# Patient Record
Sex: Female | Born: 1935
Health system: Southern US, Community
[De-identification: ages and names within clinical notes are randomized; demographics above are authoritative.]

## PROBLEM LIST (undated history)

## (undated) DIAGNOSIS — C569 Malignant neoplasm of unspecified ovary: Secondary | ICD-10-CM

## (undated) DIAGNOSIS — F0390 Unspecified dementia without behavioral disturbance: Secondary | ICD-10-CM

## (undated) DIAGNOSIS — D649 Anemia, unspecified: Secondary | ICD-10-CM

## (undated) DIAGNOSIS — J302 Other seasonal allergic rhinitis: Secondary | ICD-10-CM

## (undated) DIAGNOSIS — E039 Hypothyroidism, unspecified: Secondary | ICD-10-CM

## (undated) DIAGNOSIS — M810 Age-related osteoporosis without current pathological fracture: Secondary | ICD-10-CM

## (undated) DIAGNOSIS — K449 Diaphragmatic hernia without obstruction or gangrene: Secondary | ICD-10-CM

## (undated) DIAGNOSIS — I10 Essential (primary) hypertension: Secondary | ICD-10-CM

## (undated) DIAGNOSIS — E785 Hyperlipidemia, unspecified: Secondary | ICD-10-CM

## (undated) DIAGNOSIS — E669 Obesity, unspecified: Secondary | ICD-10-CM

## (undated) DIAGNOSIS — H3530 Unspecified macular degeneration: Secondary | ICD-10-CM

## (undated) DIAGNOSIS — K219 Gastro-esophageal reflux disease without esophagitis: Secondary | ICD-10-CM

## (undated) HISTORY — DX: Essential (primary) hypertension: I10

## (undated) HISTORY — PX: OTHER SURGICAL HISTORY: SHX169

## (undated) HISTORY — PX: ELBOW ARTHROPLASTY: SHX928

## (undated) HISTORY — DX: Malignant neoplasm of unspecified ovary: C56.9

## (undated) HISTORY — DX: Unspecified macular degeneration: H35.30

## (undated) HISTORY — DX: Diaphragmatic hernia without obstruction or gangrene: K44.9

## (undated) HISTORY — DX: Hypothyroidism, unspecified: E03.9

## (undated) HISTORY — DX: Age-related osteoporosis without current pathological fracture: M81.0

## (undated) HISTORY — DX: Hyperlipidemia, unspecified: E78.5

## (undated) HISTORY — PX: CHOLECYSTECTOMY, LAPAROSCOPIC: SHX56

## (undated) HISTORY — DX: Obesity, unspecified: E66.9

---

## 1998-03-12 ENCOUNTER — Other Ambulatory Visit: Admission: RE | Admit: 1998-03-12 | Discharge: 1998-03-12 | Payer: Self-pay | Admitting: Internal Medicine

## 1998-08-29 ENCOUNTER — Ambulatory Visit (HOSPITAL_COMMUNITY): Admission: RE | Admit: 1998-08-29 | Discharge: 1998-08-29 | Payer: Self-pay | Admitting: Internal Medicine

## 1998-09-19 ENCOUNTER — Inpatient Hospital Stay (HOSPITAL_COMMUNITY): Admission: EM | Admit: 1998-09-19 | Discharge: 1998-09-30 | Payer: Self-pay | Admitting: Emergency Medicine

## 1999-04-08 ENCOUNTER — Ambulatory Visit (HOSPITAL_COMMUNITY): Admission: RE | Admit: 1999-04-08 | Discharge: 1999-04-08 | Payer: Self-pay | Admitting: *Deleted

## 1999-04-17 ENCOUNTER — Ambulatory Visit (HOSPITAL_COMMUNITY): Admission: RE | Admit: 1999-04-17 | Discharge: 1999-04-17 | Payer: Self-pay | Admitting: *Deleted

## 1999-04-28 ENCOUNTER — Encounter (HOSPITAL_COMMUNITY): Admission: RE | Admit: 1999-04-28 | Discharge: 1999-07-21 | Payer: Self-pay | Admitting: Internal Medicine

## 1999-09-03 ENCOUNTER — Other Ambulatory Visit: Admission: RE | Admit: 1999-09-03 | Discharge: 1999-09-03 | Payer: Self-pay | Admitting: Internal Medicine

## 2001-05-05 ENCOUNTER — Other Ambulatory Visit: Admission: RE | Admit: 2001-05-05 | Discharge: 2001-05-05 | Payer: Self-pay | Admitting: Internal Medicine

## 2002-05-24 ENCOUNTER — Encounter (HOSPITAL_COMMUNITY): Admission: RE | Admit: 2002-05-24 | Discharge: 2002-08-22 | Payer: Self-pay | Admitting: Internal Medicine

## 2002-06-28 ENCOUNTER — Encounter: Payer: Self-pay | Admitting: Gastroenterology

## 2002-06-28 ENCOUNTER — Encounter: Admission: RE | Admit: 2002-06-28 | Discharge: 2002-06-28 | Payer: Self-pay | Admitting: Gastroenterology

## 2002-08-15 ENCOUNTER — Encounter: Payer: Self-pay | Admitting: Surgery

## 2002-08-22 ENCOUNTER — Encounter: Payer: Self-pay | Admitting: Surgery

## 2002-08-22 ENCOUNTER — Inpatient Hospital Stay (HOSPITAL_COMMUNITY): Admission: RE | Admit: 2002-08-22 | Discharge: 2002-08-25 | Payer: Self-pay | Admitting: Surgery

## 2002-08-22 ENCOUNTER — Encounter (INDEPENDENT_AMBULATORY_CARE_PROVIDER_SITE_OTHER): Payer: Self-pay

## 2002-08-23 ENCOUNTER — Encounter: Payer: Self-pay | Admitting: Surgery

## 2003-12-22 HISTORY — PX: OTHER SURGICAL HISTORY: SHX169

## 2004-02-10 ENCOUNTER — Emergency Department (HOSPITAL_COMMUNITY): Admission: EM | Admit: 2004-02-10 | Discharge: 2004-02-11 | Payer: Self-pay

## 2004-02-21 ENCOUNTER — Other Ambulatory Visit: Admission: RE | Admit: 2004-02-21 | Discharge: 2004-02-21 | Payer: Self-pay | Admitting: *Deleted

## 2004-03-12 ENCOUNTER — Ambulatory Visit: Admission: RE | Admit: 2004-03-12 | Discharge: 2004-03-12 | Payer: Self-pay | Admitting: Gynecology

## 2004-04-01 ENCOUNTER — Inpatient Hospital Stay (HOSPITAL_COMMUNITY): Admission: RE | Admit: 2004-04-01 | Discharge: 2004-04-09 | Payer: Self-pay | Admitting: Obstetrics & Gynecology

## 2004-04-01 ENCOUNTER — Encounter (INDEPENDENT_AMBULATORY_CARE_PROVIDER_SITE_OTHER): Payer: Self-pay | Admitting: *Deleted

## 2004-04-15 ENCOUNTER — Ambulatory Visit: Admission: RE | Admit: 2004-04-15 | Discharge: 2004-04-15 | Payer: Self-pay | Admitting: Gynecology

## 2004-05-20 ENCOUNTER — Ambulatory Visit: Admission: RE | Admit: 2004-05-20 | Discharge: 2004-05-20 | Payer: Self-pay | Admitting: Gynecology

## 2004-07-03 ENCOUNTER — Encounter: Admission: RE | Admit: 2004-07-03 | Discharge: 2004-07-03 | Payer: Self-pay | Admitting: Otolaryngology

## 2004-09-09 ENCOUNTER — Encounter (INDEPENDENT_AMBULATORY_CARE_PROVIDER_SITE_OTHER): Payer: Self-pay | Admitting: Specialist

## 2004-09-09 ENCOUNTER — Ambulatory Visit: Admission: RE | Admit: 2004-09-09 | Discharge: 2004-09-09 | Payer: Self-pay | Admitting: Gynecology

## 2004-09-09 ENCOUNTER — Other Ambulatory Visit: Admission: RE | Admit: 2004-09-09 | Discharge: 2004-09-09 | Payer: Self-pay | Admitting: Gynecology

## 2004-12-09 ENCOUNTER — Ambulatory Visit: Admission: RE | Admit: 2004-12-09 | Discharge: 2004-12-09 | Payer: Self-pay | Admitting: Gynecology

## 2005-03-27 ENCOUNTER — Ambulatory Visit: Admission: RE | Admit: 2005-03-27 | Discharge: 2005-03-27 | Payer: Self-pay | Admitting: Gynecology

## 2005-10-02 ENCOUNTER — Ambulatory Visit: Admission: RE | Admit: 2005-10-02 | Discharge: 2005-10-02 | Payer: Self-pay | Admitting: Gynecology

## 2005-10-23 ENCOUNTER — Encounter: Admission: RE | Admit: 2005-10-23 | Discharge: 2005-10-23 | Payer: Self-pay | Admitting: Orthopedic Surgery

## 2006-04-07 ENCOUNTER — Ambulatory Visit: Admission: RE | Admit: 2006-04-07 | Discharge: 2006-04-07 | Payer: Self-pay | Admitting: Gynecology

## 2006-04-21 ENCOUNTER — Encounter: Payer: Self-pay | Admitting: Emergency Medicine

## 2006-04-21 ENCOUNTER — Encounter: Payer: Self-pay | Admitting: Orthopedic Surgery

## 2006-10-15 ENCOUNTER — Ambulatory Visit: Admission: RE | Admit: 2006-10-15 | Discharge: 2006-10-15 | Payer: Self-pay | Admitting: Gynecology

## 2007-04-05 ENCOUNTER — Ambulatory Visit: Admission: RE | Admit: 2007-04-05 | Discharge: 2007-04-05 | Payer: Self-pay | Admitting: Gynecology

## 2007-08-17 ENCOUNTER — Encounter: Admission: RE | Admit: 2007-08-17 | Discharge: 2007-11-15 | Payer: Self-pay | Admitting: Internal Medicine

## 2007-09-30 ENCOUNTER — Ambulatory Visit: Admission: RE | Admit: 2007-09-30 | Discharge: 2007-09-30 | Payer: Self-pay | Admitting: Gynecology

## 2008-03-23 ENCOUNTER — Ambulatory Visit: Admission: RE | Admit: 2008-03-23 | Discharge: 2008-03-23 | Payer: Self-pay | Admitting: Gynecology

## 2008-09-21 ENCOUNTER — Encounter: Admission: RE | Admit: 2008-09-21 | Discharge: 2008-09-21 | Payer: Self-pay | Admitting: Internal Medicine

## 2008-09-26 ENCOUNTER — Ambulatory Visit: Admission: RE | Admit: 2008-09-26 | Discharge: 2008-09-26 | Payer: Self-pay | Admitting: Gynecology

## 2009-05-17 ENCOUNTER — Ambulatory Visit: Admission: RE | Admit: 2009-05-17 | Discharge: 2009-05-17 | Payer: Self-pay | Admitting: Gynecology

## 2009-11-18 ENCOUNTER — Encounter: Admission: RE | Admit: 2009-11-18 | Discharge: 2009-11-18 | Payer: Self-pay | Admitting: Orthopedic Surgery

## 2010-01-09 ENCOUNTER — Inpatient Hospital Stay (HOSPITAL_COMMUNITY): Admission: RE | Admit: 2010-01-09 | Discharge: 2010-01-11 | Payer: Self-pay | Admitting: Orthopedic Surgery

## 2010-01-09 ENCOUNTER — Encounter (INDEPENDENT_AMBULATORY_CARE_PROVIDER_SITE_OTHER): Payer: Self-pay | Admitting: Orthopedic Surgery

## 2011-03-08 LAB — CBC
HCT: 38.7 % (ref 36.0–46.0)
Hemoglobin: 10.7 g/dL — ABNORMAL LOW (ref 12.0–15.0)
Hemoglobin: 12.9 g/dL (ref 12.0–15.0)
MCHC: 34.3 g/dL (ref 30.0–36.0)
MCV: 91.7 fL (ref 78.0–100.0)
MCV: 92.5 fL (ref 78.0–100.0)
RBC: 4.18 MIL/uL (ref 3.87–5.11)
RDW: 13.9 % (ref 11.5–15.5)
RDW: 14.2 % (ref 11.5–15.5)

## 2011-03-08 LAB — COMPREHENSIVE METABOLIC PANEL
ALT: 17 U/L (ref 0–35)
Albumin: 3.9 g/dL (ref 3.5–5.2)
BUN: 21 mg/dL (ref 6–23)
Calcium: 9.8 mg/dL (ref 8.4–10.5)
GFR calc Af Amer: >60 mL/min (ref >60)
GFR calc non Af Amer: >60 mL/min (ref >60)
Sodium: 142 mEq/L (ref 135–145)
Total Protein: 6.8 g/dL (ref 6.0–8.3)

## 2011-03-08 LAB — TISSUE CULTURE
Culture: NO GROWTH
Culture: NO GROWTH

## 2011-03-08 LAB — DIFFERENTIAL
Basophils Relative: 1 % (ref 0–1)
Lymphocytes Relative: 19 % (ref 12–46)
Lymphs Abs: 1.6 10*3/uL (ref 0.7–4.0)
Monocytes Absolute: 0.9 10*3/uL (ref 0.1–1.0)

## 2011-03-08 LAB — BASIC METABOLIC PANEL
BUN: 19 mg/dL (ref 6–23)
CO2: 29 mEq/L (ref 19–32)
GFR calc Af Amer: >60 mL/min (ref >60)
Glucose, Bld: 113 mg/dL — ABNORMAL HIGH (ref 70–99)

## 2011-03-08 LAB — ANAEROBIC CULTURE

## 2011-03-08 LAB — PROTIME-INR: INR: 1.05 (ref 0.00–1.49)

## 2011-03-31 LAB — CA 125: CA 125: 6.3 U/mL (ref 0.0–30.2)

## 2011-05-05 NOTE — Consult Note (Signed)
[name], [name]                ACCOUNT NO.:  [id number]   MEDICAL RECORD NO.:  [id number]          PATIENT TYPE:  OUT   LOCATION:  GYN                          FACILITY:  [facility]   PHYSICIAN:  De Blanch, M.D.DATE OF BIRTH:  01/14/1931   DATE OF CONSULTATION:  03/23/2008  DATE OF DISCHARGE:                                 CONSULTATION   GYN ONCOLOGY CLINIC   CHIEF COMPLAINT:  Ovarian cancer, obesity.   INTERVAL HISTORY:  The patient returns today for continuing followup of  stage IA, grade 2, ovarian cancer diagnosed in April, 2005.  Since her  last visit she has done well, she has become less physically active and  notes weight gain.  She denies any GI or GU symptoms and has no pelvic  pain, pressure, vaginal bleeding or discharge.  Functional status is  good.   HISTORY OF PRESENT ILLNESS:  Stage IA, grade 2, ovarian cancer initially  staged April of 2005.  At the same time she was found to have a  perforated sigmoid colon which was resected and reanastomosed by Dr.  Zachery Dakins.  Because of her good prognostic features she did not receive  any adjuvant therapy.   PAST MEDICAL HISTORY/MEDICAL ILLNESSES:  1. Obesity.  2. Hypertension.  3. Macular degeneration.  4. Hyperlipidemia.  5. Hypothyroidism.  6. Hiatal hernia.   DRUG ALLERGIES:  None.   PAST SURGICAL HISTORY:  1. Laparoscopic cholecystectomy.  2. Hiatal hernia repair.  3. Ovarian cancer resection and staging.  4. Rectosigmoid colectomy with reanastomosis for diverticulitis.   CURRENT MEDICATIONS:  Are reviewed and unchanged from previous notation.   OBSTETRICAL HISTORY:  Gravida 2.   SOCIAL HISTORY:  1. The patient does not smoke.  2. She is married.  3. She is retired and enjoys her [place].   REVIEW OF SYSTEMS:  A 10-point comprehensive review of systems negative  except as noted above.   PHYSICAL EXAM:  Weight 210 pounds (up 6 pounds from October, 2008).  GENERAL:  The patient is an  obese, pleasant, elderly white female in no  acute distress.  HEENT:  Negative.  NECK:  Supple, without thyromegaly.  There is no supraclavicular or  inguinal adenopathy.  ABDOMEN:  Soft, nontender.  No mass, organomegaly, ascites or hernias  are noted.  Midline incision is well-healed.  PELVIC EXAM:  EG/BUS, vagina, bladder, urethra are normal.  Cervix and  uterus surgically absent.  ADNEXA:  Without masses.  RECTOVAGINAL EXAM:  Confirms.  LOWER EXTREMITIES:  Have 1+ ankle edema.   IMPRESSION:  Stage IA, grade 2, endometrial cancer.  No evidence of  recurrent disease.   PLAN:  CA-125 is obtained today.  The patient will return to see [place] in 6  months for continuing followup.      De Blanch, M.D.  Electronically Signed     DC/MEDQ  D:  03/23/2008  T:  03/23/2008  Job:  [id number]   cc:   Telford Nab, R.N.  [address]  [place], [place] [address]   Peter M. [place], M.D.  Fax: [phone]   Larina Earthly,  M.D.  Fax: [phone]

## 2011-05-05 NOTE — Consult Note (Signed)
[name], [name]                ACCOUNT NO.:  [id number]   MEDICAL RECORD NO.:  [id number]          PATIENT TYPE:  OUT   LOCATION:  GYN                          FACILITY:  [facility]   PHYSICIAN:  De Blanch, M.D.DATE OF BIRTH:  1931/03/02   DATE OF CONSULTATION:  DATE OF DISCHARGE:                                 CONSULTATION   CHIEF COMPLAINT:  Ovarian cancer.   INTERVAL HISTORY:  The patient returns today for continuing followup of  stage IA grade 2 ovarian cancer initially diagnosed in April 2005.  Since her last visit, she has had no GYN problems, no GI problems, and  has normal bladder function.  She has a ventral hernia, which is  asymptomatic.  The patient has had increasing difficulties with her  right elbow and recently fell out of a swing and had a significant  hematoma in her left calf.  She is currently on Augmentin because of  concern about cellulitis.   HISTORY OF PRESENT ILLNESS:  Stage IA grade 2 ovarian cancer.  She  underwent initial surgery and staging in April 2005.  At that time, she  was found to have a sigmoid perforation from diverticula and underwent  resection and reanastomosis.  She has not received any adjuvant therapy  for her early ovarian cancer.   PAST MEDICAL HISTORY:  Medical illnesses:  Obesity, hypertension,  macular degeneration, hyperlipidemia, hypothyroidism, and hiatal hernia.   DRUG ALLERGIES:  None.   PAST SURGICAL HISTORY:  Laparoscopic cholecystectomy, hiatal hernia  repair, left elbow replacement, ovarian cancer resection and staging,  and rectosigmoid colectomy with reanastomosis.   CURRENT MEDICATIONS:  Reviewed and unchanged from previous notations.   OBSTETRICAL HISTORY:  Gravida 2.   SOCIAL HISTORY:  The patient does not smoke.  She is married and is  retired.   REVIEW OF SYSTEMS:  Ten-point comprehensive review of systems is  negative except as noted above.   PHYSICAL EXAMINATION:  VITAL SIGNS:  Weight 205  pounds.  GENERAL:  The patient is a healthy white female in no acute distress.  HEENT:  Negative.  NECK:  Supple without thyromegaly.  There is no supraclavicular or  inguinal adenopathy.  ABDOMEN:  Obese, soft, and nontender.  No masses, organomegaly, or  ascites are noted.  She does have a easily reducible ventral hernia in  the upper aspect of her incision.  PELVIC:  EG, BUS, vagina, bladder, and urethra are normal.  Cervix and  uterus surgically absent.  Adnexa without masses.  RECTOVAGINAL:  Confirms.  LOWER EXTREMITIES:  1+ ankle edema.   IMPRESSION:  Stage I ovarian cancer.  No evidence of recurrent disease.   PLAN:  CA-125 is obtained today.  The patient will return to see [place] in 6  months for continuing followup.      De Blanch, M.D.  Electronically Signed     DC/MEDQ  D:  09/26/2008  T:  09/27/2008  Job:  [id number]   cc:   Telford Nab, R.N.  [address]  [place], [place] [address]   Peter M. [place], M.D.  Fax: [phone]  Larina Earthly, M.D.  Fax: [phone]

## 2011-05-05 NOTE — Consult Note (Signed)
[name], [name]                ACCOUNT NO.:  [id number]   MEDICAL RECORD NO.:  [id number]          PATIENT TYPE:  OUT   LOCATION:  GYN                          FACILITY:  [facility]   PHYSICIAN:  De Blanch, M.D.DATE OF BIRTH:  26-Jun-1931   DATE OF CONSULTATION:  05/17/2009  DATE OF DISCHARGE:                                 CONSULTATION   CHIEF COMPLAINT:  Ovarian cancer.   INTERVAL HISTORY:  The patient returns today for continuing follow-up of  stage IA grade 2 ovarian cancer.  Since her last visit, she has done  well.  She denies any GI or GU symptoms.  Has no pelvic pain, pressure,  vaginal bleeding or discharge.  Her main problem has been that she fell  and injured her left artificial elbow, and this is being managed at [facility].   HISTORY OF PRESENT ILLNESS:  Stage IA grade 2 ovarian cancer undergoing  initial staging April 2005.  At that time, she was found to have sigmoid  perforation from a diverticula and underwent a resection and  reanastomosis.  Given the good prognostic features, she did not receive  any adjuvant therapy for her ovarian cancer and has been followed with  no evidence of recurrent disease.   PAST MEDICAL HISTORY:  Medical illnesses:  1. Obesity.  2. Hypertension.  3. Macular degeneration.  4. Hyperlipidemia.  5. Hypothyroidism.  6. Hiatal hernia.   DRUG ALLERGIES:  None.   PAST SURGICAL HISTORY:  1. Laparoscopic cholecystectomy.  2. Hiatal hernia repair.  3. Left elbow replacement.  4. Ovarian cancer resection and staging.  5. Rectosigmoid colectomy with reanastomosis.   CURRENT MEDICATIONS:  None.   OBSTETRICAL HISTORY:  Gravida 2.   SOCIAL HISTORY:  The patient does not smoke.  She is married and  retired.   REVIEW OF SYSTEMS:  Ten-point comprehensive review of systems negative  except as noted above.   PHYSICAL EXAMINATION:  Weight 206 pounds (stable), blood pressure  138/72.  In general, the patient is a  healthy, white female in no acute distress.  HEENT:  Negative.  NECK:  Supple without thyromegaly.  There is no supraventricular or  inguinal adenopathy.  ABDOMEN:  Obese, soft, nontender.  No masses, organomegaly, ascites are  noted.  She does have an umbilical hernia.  PELVIC EXAM:  EGBUS, vagina, bladder, urethra are normal.  Cervix and  uterus surgically absent.  Adnexa without masses.  Rectovaginal exam  confirms.  LOWER EXTREMITIES:  Have 1+ ankle.   IMPRESSION:  Stage IA grade 2 ovarian cancer, now with over 5 years of  followup.  A CA125 will be obtained today.  At this juncture, we will  release the patient back to her primary care physician for continuing  surveillance.  I would recommend the patient have annual pelvic exam and  annual CA125 values.  She is instructed regarding warning signs of  recurrent ovarian cancer, although I think the possibility of this is  exceedingly rare given her very good prognostic lesion.  We would be  happy to see her again in the future if a  problem arises.      De Blanch, M.D.  Electronically Signed     DC/MEDQ  D:  05/17/2009  T:  05/17/2009  Job:  [id number]   cc:   Telford Nab, R.N.  [address]  [place], [place] [address]   Peter M. [place], M.D.  Fax: [phone]   Larina Earthly, M.D.  Fax: [phone]

## 2011-05-05 NOTE — Consult Note (Signed)
NAMESWATI, [name]                ACCOUNT NO.:  [id number]   MEDICAL RECORD NO.:  [id number]          PATIENT TYPE:  OUT   LOCATION:  GYN                          FACILITY:  [facility]   PHYSICIAN:  De Blanch, M.D.DATE OF BIRTH:  24-Jun-1931   DATE OF CONSULTATION:  09/30/2007  DATE OF DISCHARGE:                                 CONSULTATION   CHIEF COMPLAINT:  Ovarian cancer.   INTERVAL HISTORY:  Since her last visit, the patient has done well.  She  denies any GI or GU symptoms.  She has no pelvic pain, pressure, vaginal  bleeding, or discharge.  It is noted that she recently had colonoscopy  which was normal, and showed no evidence of diverticulosis.  Her  anastomosis was patent.   HISTORY OF PRESENT ILLNESS:  Stage IA grade 2, ovarian cancer, initially  diagnosed and staged in April 2005.  At the same time she was found to  have a perforated sigmoid colon which was resected and re-anastomosed by  Dr. Zachery Dakins.  Because of her good prognostic features, did not receive  any adjuvant therapy.   PAST MEDICAL HISTORY/MEDICAL ILLNESSES:  1. Obesity.  2. Hypertension.  3. Macular degeneration.  4. Hyperlipidemia.  5. Hypothyroidism.  6. Hiatal hernia.   DRUG ALLERGIES:  None   PAST SURGICAL HISTORY LAPAROSCOPIC CHOLECYSTECTOMY:  1. Hiatal hernia repair.  2. Ovarian cancer resection.  3. End-staging sigmoid colectomy with reanastomosis for      diverticulitis.   CURRENT MEDICATIONS:  Reviewed, and unchanged from previous notation.   OBSTETRICAL HISTORY:  Gravida 2.   SOCIAL HISTORY:  The patient is not a smoker, she is retired.   REVIEW OF SYSTEMS:  A 10-point comprehensive review of systems is  negative except as noted above.   PHYSICAL EXAM:  VITAL SIGNS:  Weight 204 pounds.  Blood pressure 160/98,  pulse 80, respiratory rate 20.  GENERAL:  The patient is a healthy white female in no acute distress.  HEENT:  Negative.  NECK: Supple without thyromegaly.  There  is no supraclavicular or  inguinal adenopathy.  ABDOMEN:  Soft, nontender, no masses, no organomegaly, or hiatal hernia  is noted.  PELVIC:  EG/BUS, vagina, bladder, and urethra are normal.  Cervix and  uterus, surgically absent.  Adnexa without masses.  RECTOVAGINAL EXAM: Confirms.  EXTREMITIES:  Lower extremities without edema or varicosities.   IMPRESSION:  Stage IA grade 2 ovarian cancer no evidence of recurrent  disease.   PLAN:  CA-125 will be obtained today.  The patient will return in six  months for continuing follow up.      De Blanch, M.D.  Electronically Signed     DC/MEDQ  D:  09/30/2007  T:  09/30/2007  Job:  [id number]   cc:   Peter M. [place], M.D.  Fax: [phone]   Telford Nab, R.N.  [address]  [place], [place] [address]   Larina Earthly, M.D.  Fax: [phone]

## 2011-05-08 NOTE — Consult Note (Signed)
NAMEJAIDA, [name]                ACCOUNT NO.:  [id number]   MEDICAL RECORD NO.:  [id number]          PATIENT TYPE:  OUT   LOCATION:  GYN                          FACILITY:  [facility]   PHYSICIAN:  De Blanch, M.D.DATE OF BIRTH:  06/12/1931   DATE OF CONSULTATION:  DATE OF DISCHARGE:                                   CONSULTATION   A 77 year old white female returns for continuing followup of a stage IA  grade II ovarian cancer.   INTERVAL HISTORY:  Since her last visit, the patient has done well.  She has  no GI or GU symptoms.  Specifically, rectal function is good.  She uses  Metamucil on occasion.  She denies any other pelvic pain or pressure,  bloating, or decreased appetite.   HISTORY OF PRESENT ILLNESS:  The patient is found to have a stage IA, grade  II ovarian cancer, initially operated on in April, 2005.  She had sigmoid  resection with anastomosis by Dr. Zachery Dakins at the same time because of a  perforated sigmoid diverticulum.  Given the good prognosis of her lesion, no  adjuvant therapy was required.   MEDICAL ILLNESSES:  1.  Macular degeneration.  2.  Hyperlipidemia.  3.  Hypothyroidism.  4.  Hiatal hernia.  5.  Obesity.   DRUG ALLERGIES:  None.   PAST SURGICAL HISTORY:  1.  Reconstruction of left elbow.  2.  Laparoscopic cholecystectomy.  3.  Repair of hiatal hernia.  4.  Ovarian cancer staging.  5.  Sigmoid colectomy for diverticulitis.   MEDICATIONS:  Reviewed and unchanged from her prior medication list in our  records.   OBSTETRICAL HISTORY:  Gravida 2.   REVIEW OF SYSTEMS:  Negative except as noted above.   PHYSICAL EXAMINATION:  VITAL SIGNS:  Weight 199 pounds.  Blood pressure  130/84.  GENERAL:  The patient is a pleasant, obese white female in no acute  distress.  HEENT:  Negative.  NECK:  Supple without thyromegaly.  There is no supraclavicular or inguinal  adenopathy.  ABDOMEN:  Obese, soft and nontender.  No mass, organomegaly,  ascites, or  hernias are noted.  The midline incision is well healed.  PELVIC:  EG/BUS, vagina, bladder, and urethra are normal but atrophic.  The  cuff is well healed and well supported.  No lesions are noted.  Bimanual and  rectovaginal exam reveals no mass, induration, or nodularity.   IMPRESSION:  Stage IA grade II ovarian cancer.  No evidence of recurrent  disease.   CA-125 will be obtained today.  Patient is to return in six months for  continuing followup.      DC/MEDQ  D:  03/27/2005  T:  03/27/2005  Job:  [id number]   cc:   Anselm Pancoast. Zachery Dakins, M.D.  [address]  [place]  [place] [address]   Larina Earthly, M.D.  [address]  [place]  [place] [address]  Fax: [phone]   Telford Nab, R.N.  [address]  [place], [place] [address]

## 2011-05-08 NOTE — Consult Note (Signed)
[name], [name] NO.:  [id number]   MEDICAL RECORD NO.:  [id number]                   PATIENT TYPE:  OUT   LOCATION:  GYN                                  FACILITY:  [facility]   PHYSICIAN:  De Blanch, M.D.         DATE OF BIRTH:  [birth year]   DATE OF CONSULTATION:  04/16/2004  DATE OF DISCHARGE:                                   CONSULTATION   REASON FOR CONSULTATION:  A 77 year old white female returns now two weeks  following extended staging for early ovarian cancer and sigmoid resection  with anastomosis for diverticular perforation. The patient has had an  uncomplicated postoperative course.   Currently she notes normal bowel function and has no difficulty with her  incision.  Her energy level and functional status is improving steadily.   PHYSICAL EXAMINATION:  VITAL SIGNS:  Weight 196 pounds, blood pressure  148/94.  GENERAL:  The patient is a pleasant, elderly, white female in no acute  distress.  HEENT:  Negative.  NECK:  Supple without thyromegaly.  ABDOMEN:  Obese, soft, nontender, no mass, organomegaly, ascites or hernias  are noted.  Midline incision is healing well. The remainder of her staples  in her incision are removed and Steri-Strips are applied.  PELVIC:  Deferred.   IMPRESSION:  The patient's pathology is reviewed in detail. She has a grade  2 adenocarcinoma of the ovary.  With comprehensive surgical staging, she is  in fact a stage IA.  Based on these findings, she is low risk for recurrence  and at the present time the [facility] does not feel the patient would warrant  adjuvant therapy.  The patient is given instructions for continued  postoperative recovery.  We will plan on seeing her back for a six week  checkup.  She is also scheduled to see Anselm Pancoast. Zachery Dakins, M.D. for  followup of her colonic anastomosis.  Thereafter we will arrange for the  patient to begin surveillance exams at three month  intervals.                                               De Blanch, M.D.    DC/MEDQ  D:  04/16/2004  T:  04/16/2004  Job:  [id number]   cc:   Anselm Pancoast. Zachery Dakins, M.D.  [address]  [place]  [place] [address]  Fax: [phone]   Larina Earthly, M.D.  [address]  [place]  [place] [address]  Fax: [phone]   Telford Nab, R.N.  [address]  [place], [place] [address]

## 2011-05-08 NOTE — Consult Note (Signed)
NAME:  [name], [name] NO.:  [id number]   MEDICAL RECORD NO.:  [id number]                   PATIENT TYPE:  OUT   LOCATION:  GYN                                  FACILITY:  [facility]   PHYSICIAN:  De Blanch, M.D.         DATE OF BIRTH:  25-Apr-1931   DATE OF CONSULTATION:  03/12/2004  DATE OF DISCHARGE:                                   CONSULTATION   REASON FOR CONSULTATION:  A 77 year old white female seen in consultation at  the request of Dr. Felipa Eth regarding newly-diagnosed ovarian cyst.  The patient  was in her usual state of health until she developed some abdominal pain,  was admitted to [facility].  The abdominal pain apparently was  caused by extensive diverticulitis and was treated with antibiotics.  However, in the course of the workup for the abdominal pain the patient had  a CT scan of the abdomen and pelvis which showed large ovarian cysts.  The  right ovary has a large simple cyst measuring 7.7 cm.  The left ovary  measures 10.8 x 7.4 cm and appears complex with calcifications.  There is no  evidence of ascites, adenopathy, and CT scan of the upper abdomen is normal.  She has had a CA125 value which is 44.9 units/mL.  The remainder of her  metabolic survey, CBC were normal.   She denies any pelvic pain, pressure, and has had no vaginal bleeding or  discharge.   PAST MEDICAL HISTORY:  Medical illnesses:  Macular degeneration,  hyperlipidemia, hypothyroidism, GI bleeding associated with a hiatal hernia  (resolved), obesity.  The patient denies any cardiac disease and does not  have diabetes or hypertension.   DRUG ALLERGIES:  None.   PAST SURGICAL HISTORY:  Reconstruction of her left elbow in 1999.  Laparoscopic cholecystectomy and repair of hiatal hernia.   OBSTETRICAL HISTORY:  Gravida 2.   CURRENT MEDICATIONS:  BASA, Zocor, multivitamin, Tums, Ocuvite, ferrous  sulfate, __________, Protonix, Evista, Synthroid,  hydrocodone p.r.n.,  metronidazole (recently completed course).   REVIEW OF SYMPTOMS:  Essentially negative.   SOCIAL HISTORY:  The patient is married.  She does not smoke or drink.   PHYSICAL EXAMINATION:  VITAL SIGNS:  Weight 200 pounds, height 5 feet 3  inches, blood pressure 166/90, pulse 84, respiratory rate 28.  GENERAL:  The patient is a healthy, pleasant white female in no acute  distress.  HEENT:  Negative.  NECK:  Supple without thyromegaly.  NODES:  There is no supraclavicular, axillary, or inguinal adenopathy.  ABDOMEN:  Soft, nontender.  No masses, organomegaly, ascites, or hernias are  noted except for a small umbilical hernia at the site of her laparoscopy  site.  The remainder of her laparoscopic incisions for the cholecystectomy  are well healed.  PELVIC:  EG/BUS, vagina, bladder, urethra are normal.  Cervix is atrophic.  Uterus is anterior and normal shape, size, and consistency.  There  is  fullness throughout the pelvis but I am unable delineate discrete masses,  probably secondary to the patient's obesity.  Rectovaginal exam confirms.   The patient's CT scan and other records from Dr. Vicente Males office are reviewed.   IMPRESSION:  Complex left pelvic mass, simple right pelvic mass.  I would  recommend the patient undergo exploratory laparotomy, total abdominal  hysterectomy, bilateral salpingo-oophorectomy, and intraoperative frozen  section to delineate the histology of this disease.  If she does have an  ovarian cancer then we would proceed at the same time with surgical staging  and debulking.  The risks of surgery including hemorrhage, infection, injury  to adjacent viscera, thromboembolic complications, anesthetic risks were  outlined to the patient and her husband.  They are in agreement to proceed  and we will schedule the surgery in the near future.  She was offered the  opportunity to have surgery at [facility] to expedite care but she  would  prefer to have the surgery in [place].                                               De Blanch, M.D.    DC/MEDQ  D:  03/12/2004  T:  03/13/2004  Job:  [id number]   cc:   Larina Earthly, M.D.  [address]  [place]  [place] [address]  Fax: [phone]   Telford Nab, R.N.  [address]  [place], [place] [address]

## 2011-05-08 NOTE — Consult Note (Signed)
NAMEEVERETT, RICCIARDELLI                ACCOUNT NO.:  [id number]   MEDICAL RECORD NO.:  [id number]          PATIENT TYPE:  OUT   LOCATION:  GYN                          FACILITY:  [facility]   PHYSICIAN:  De Blanch, M.D.DATE OF BIRTH:  02-06-1931   DATE OF CONSULTATION:  04/07/2006  DATE OF DISCHARGE:                                   CONSULTATION   CHIEF COMPLAINT:  Ovarian cancer.   INTERVAL HISTORY:  The patient returns for continuing followup of ovarian  cancer initially diagnosed in April of 2005.  Since her last visit she has  done well.  She denies any GI or GU symptoms, has no pelvic pain, pressure,  vaginal bleeding or discharge.  Functional status is very good.  The  patient's husband recently underwent a second coronary artery bypass  grafting and apparently is recovering well.   HISTORY OF PRESENT ILLNESS:  The patient has a Stage IA grade 2 ovarian  cancer.  She underwent surgery in April, 2005.  At the same time she was  found to have a perforated sigmoid diverticulum and Dr. Consuello Bossier  removed her sigmoid colon and did a reanastomosis.  Given the good  prognostic features of this patient's tumor, no adjuvant therapy was  recommended.   PAST MEDICAL HISTORY:  1.  Macular degeneration.  2.  Hyperlipidemia.  3.  Hypothyroidism.  4.  Hiatal hernia.  5.  Obesity.   DRUG ALLERGIES:  NONE.   SURGICAL HISTORY:  1.  Reconstruction of her left elbow.  2.  Laparoscopic cholecystectomy.  3.  Repair of hiatal hernia.  4.  Ovarian cancer resection and staging.  5.  Sigmoid colectomy for diverticulitis.   CURRENT MEDICATIONS:  Are reviewed and unchanged from my previous notations  in this record.   OBSTETRICAL HISTORY:  Gravida 2.   REVIEW OF SYSTEMS:  The 10-point comprehensive review of systems negative  except as noted above.   PHYSICAL EXAM:  Weight 198 pounds (stable).  In general, the patient is a  healthy white female in no acute distress.  HEENT:   Is negative.  NECK:  Supple without thyromegaly.  There is no supraclavicular or inguinal  adenopathy.  BREASTS:  Are pendulous without masses, nodularity or skin changes.  There  is no axillary adenopathy.  ABDOMEN:  Is obese, soft, nontender.  No mass, organomegaly __________  noted.  PELVIC EXAM:  EG/BUS.  Vagina, bladder, urethra are normal but atrophic.  Cervix and uterus surgically absent.  Adnexa without masses.  RECTOVAGINAL EXAM:  Confirms.  LOWER EXTREMITIES:  Without edema or varicosities.   IMPRESSION:  Stage IA, grade 2, ovarian cancer, no evidence of recurrent  disease.  CA-125 was obtained today.  The patient will return to see [place] in 6  months.      De Blanch, M.D.  Electronically Signed     DC/MEDQ  D:  04/07/2006  T:  04/08/2006  Job:  [id number]   cc:   Larina Earthly, M.D.  Fax: [phone]   Telford Nab, R.N.  [address]  [place], [place] [address]

## 2011-05-08 NOTE — Discharge Summary (Signed)
[name], Sara NO.:  [id number]   MEDICAL RECORD NO.:  [id number]                   PATIENT TYPE:  INP   LOCATION:  6213                                 FACILITY:  [facility]   PHYSICIAN:  De Blanch, M.D.         DATE OF BIRTH:  Jul 16, 1931   DATE OF ADMISSION:  04/01/2004  DATE OF DISCHARGE:  04/09/2004                                 DISCHARGE SUMMARY   HOSPITAL COURSE:  Following admission, the patient was taken to the  operating room, where she underwent an exploratory laparotomy for complex  pelvic mass.  She was found to have an ovarian cancer, grossly confined to  the left ovary and underwent surgical staging and resection.  In addition,  she was found to have a perforated diverticulum of the sigmoid colon, which  was managed by Dr. Consuello Bossier with a partial sigmoid colectomy and  primary anastomosis.   The patient's postoperative course was relatively smooth.  She had a  nasogastric tube for 48 hours.  This is then discontinued, and a diet was  advanced.  She had no difficulties with fever or any evidence of infection.  At the time of discharge, she was having normal bowel movements.   FINAL DIAGNOSES:  1. Stage IA Grade II ovarian cancer.  2. Perforated sigmoid colon (diverticulitis).   PLAN:  The patient will return in one week to have the staples removed.  She  is given a prescription for Vicodin 1 tablet every 4 hours p.r.n. pain.  She  will contact us if she has any fevers or chills or any worsening of her  constitutional symptoms.  She is given the usual instructions for pelvic  rest and is encouraged to drink oral fluids and resume her other  preoperative medications.   CONDITION ON DISCHARGE:  Improved.                                               De Blanch, M.D.    DC/MEDQ  D:  05/20/2004  T:  05/20/2004  Job:  [id number]   cc:   Anselm Pancoast. Zachery Dakins, M.D.  [address], [address]  [place]  [place] [address]  Fax: [phone]   Telford Nab, R.N.  [phone] [address]  [place], [place] [address]

## 2011-05-08 NOTE — Op Note (Signed)
[name], [name] NO.:  [id number]   MEDICAL RECORD NO.:  [id number]                   PATIENT TYPE:  INP   LOCATION:  0463                                 FACILITY:  [facility]   PHYSICIAN:  Anselm Pancoast. Zachery Dakins, M.D.          DATE OF BIRTH:  November 04, 1931   DATE OF PROCEDURE:  04/01/2004  DATE OF DISCHARGE:                                 OPERATIVE REPORT   PREOPERATIVE DIAGNOSIS:  Sigmoid diverticulitis with localized abscess, near  fistula formation, undergoing an abdominal hysterectomy and bilateral  salpingo-oophorectomy and node biopsy for ovarian cancer by Dr. Stanford Breed.   SURGEON:  Anselm Pancoast. Zachery Dakins, M.D.   ASSISTANT:  De Blanch, M.D.   HISTORY:  [name] is a 77 year old female who was undergoing an  abdominal hysterectomy and bilateral salpingo-oophorectomy for two ovarian  masses and cyst complex formation by Dr. Stanford Breed that had been  diagnosed approximately three weeks ago when she was presented with lower  abdominal pain and was found to have diverticulitis.  She was treated with  antibiotics.  She has had a previous hiatal hernia repair by Dr. Luretha Murphy and also a cholecystectomy and I think was actually hospitalized at  [facility]  and then as she improved, saw Dr. Stanford Breed who  scheduled her for surgery today.  He had done a mechanical bowel prep and he  had completed his abdominal hysterectomy and bilateral salpingo-oophorectomy  with the finding of a couple of firm masses in the sigmoid colon adherent to  the small bowel and asked that I scrub in.   DESCRIPTION OF PROCEDURE:  After scrubbing in, it was obvious that this was  probably an inflammatory component and there was a perforation of the mid  sigmoid with a small abscess and there was nearly a created fistula, but we  repaired the ileum.  This was about three feet from the ileocecal valve with  a single layer of 3-0 silk  sutures and then as far as on the sigmoid colon,  there were two other areas that obviously thickened like localized  diverticulitis with sort of an interim wall small abscess and it was at this  time that we had received the frozen section from the ovarian cancer and we  did send these remnants that looked more like an inflammatory than  metastatic tumor for pathology exam. I thought it would be best to go ahead  and do a sigmoid resection and remove all three of these  areas and Dr.  Stanford Breed was in agreement.  The mesentery was divided between [facility]  and then approximately a 9-10 inch segment of sigmoid colon was removed and  then a simple single layer of 3-0 silk not inverted anastomosis.  The little  mesenteric defect was closed with interrupted sutures of 3-0 silk.  The area  is floppy and definitely viable.  A couple of little areas of sutures  on the  posterior side where there was a little inflammatory bleeding with 3-0 silk.  I dropped out at this time and Dr. Stanford Breed and his assistant did a  left iliac node biopsy and then will close.   I will follow along with the patient postoperatively.  He had given her 1 g  of Kefzol preoperatively and we gave her 3 g of Unasyn intraoperatively and  we will keep her on IV Unasyn for probably about 24 hours.  There was no  free intraperitoneal abscess but this obviously was a small localized  abscess and the 24 hours should be adequate antibiotic coverage.                                              Anselm Pancoast. Zachery Dakins, M.D.   WJW/MEDQ  D:  04/01/2004  T:  04/01/2004  Job:  [id number]

## 2011-05-08 NOTE — Consult Note (Signed)
NAMEFARDOWSA, AUTHIER                ACCOUNT NO.:  [id number]   MEDICAL RECORD NO.:  [id number]          PATIENT TYPE:  OUT   LOCATION:  GYN                          FACILITY:  [facility]   PHYSICIAN:  De Blanch, M.D.DATE OF BIRTH:  08/25/1931   DATE OF CONSULTATION:  04/05/2007  DATE OF DISCHARGE:                                 CONSULTATION   CHIEF COMPLAINT:  Ovarian cancer.   INTERVAL HISTORY:  Since her last visit the patient has done well.  She  denies any GI or GU symptoms, has no pelvic pain, pressure, vaginal  bleeding or discharge.  Functional status is good.   HISTORY OF PRESENT ILLNESS:  Stage Ia, grade 2 ovarian cancer diagnosed  and staged April 2005.  At the same time she had a perforated sigmoid  colon, which was resected and reanastomosed by Dr. Consuello Bossier.  She did not receive any adjuvant therapy, given her good prognostic  features.   PAST MEDICAL HISTORY:  Medical illnesses:  Hypertension, macular  degeneration, hyperlipidemia, hypothyroidism, hiatal hernia, obesity.   DRUG ALLERGIES:  None.   PAST SURGICAL HISTORY:  Laparoscopic cholecystectomy, hiatal hernia  repair, ovarian cancer resection and staging, sigmoid colectomy for  diverticulitis.   CURRENT MEDICATIONS:  Reviewed and unchanged from previous notations.  She is also using Norvasc.   OBSTETRICAL HISTORY:  Gravida 2.   REVIEW OF SYSTEMS:  A 10-point comprehensive review of systems negative  except as noted above.   PHYSICAL EXAMINATION:  VITAL SIGNS:  Weight 202 pounds, blood pressure  160/98, on repeat 141/70.  GENERAL:  The patient is a healthy, elderly white female in no acute  distress.  HEENT:  Negative.  NECK:  Supple without thyromegaly.  There is no supraclavicular or  inguinal adenopathy.  ABDOMEN:  Obese, soft, nontender; no mass, organomegaly, ascites or  hernias noted.  She does have thickening beneath the prior burn which I  believe is consistent with fat  necrosis.  PELVIC:  EG, BUS, vagina, bladder and urethra are normal.  Cervix and  uterus are surgically absent.  Vagina is atrophic.  No lesions are  noted.  Bimanual and rectovaginal exam revealed no masses, induration or  nodularity.   IMPRESSION:  State Ia, grade 2 ovarian cancer; no evidence of recurrent  disease.   PLAN:  CA-125 is obtained today.  Patient to return to see me in six  months for continuing followup.      De Blanch, M.D.  Electronically Signed     DC/MEDQ  D:  04/05/2007  T:  04/05/2007  Job:  [id number]   cc:   Peter M. [place], M.D.  Fax: [phone]   Telford Nab, R.N.  [address]  [place], [place] [address]   Larina Earthly, M.D.  Fax: [phone]

## 2011-05-08 NOTE — Consult Note (Signed)
NAMERUVI, FULLENWIDER NO.:  [id number]   MEDICAL RECORD NO.:  [id number]                   PATIENT TYPE:  OUT   LOCATION:  GYN                                  FACILITY:  [facility]   PHYSICIAN:  De Blanch, M.D.         DATE OF BIRTH:  October 20, 1931   DATE OF CONSULTATION:  05/20/2004  DATE OF DISCHARGE:                                   CONSULTATION   GYN ONCOLOGY PROGRAM   This 77 year old white female returns for her 6-week postoperative check.  She underwent surgical staging and a sigmoid resection for management of a  grade 2, stage 1A adenocarcinoma of the ovary as well as a perforated  sigmoid colon from diverticulitis. She had an uncomplicated postoperative  course. Since her last visit she has seen Dr. Zachery Dakins who has discharged  her from his practice. She seems to be having good bowel function at the  present time.  She denies any abdominal pain or pressure.  Appetite is good;  and she has no other constitutional symptoms.   PHYSICAL EXAMINATION:  VITAL SIGNS:  Weight 195 pounds, blood pressure  130/72.  GENERAL:  In general the patient is an elderly, white female in no acute  distress.  HEENT:  Negative.  NECK:  Supple without thyromegaly.  There is no supraclavicular or inguinal  adenopathy.  ABDOMEN:  The abdomen is obese, soft and nontender.  No masses,  organomegaly, ascites or hernias noted.  Midline incision is well healed.  PELVIC EXAM:  EGBUS.  Vagina, vulva and urethra are normal.  On bimanual  vaginal exam revealed no masses, induration or __________.   IMPRESSION:  Stage 1A grade 2 ovarian cancer.  The patient has had a good  postoperative recovery and is given the okay to return to full level of  activity.  She will return to see [place] in 3 months and we will obtain a CA-125  value at that time.                                               De Blanch, M.D.    DC/MEDQ  D:  05/20/2004  T:  05/20/2004  Job:   [id number]   cc:   Anselm Pancoast. Zachery Dakins, M.D.  [address]  [place]  [place] [address]  Fax: [phone]   Dr, Leola Brazil, R.N.  [address]  [place], [place] [address]

## 2011-05-08 NOTE — Consult Note (Signed)
[name], [name]                ACCOUNT NO.:  [id number]   MEDICAL RECORD NO.:  [id number]          PATIENT TYPE:  OUT   LOCATION:  GYN                          FACILITY:  [facility]   PHYSICIAN:  De Blanch, M.D.DATE OF BIRTH:  1931/07/21   DATE OF CONSULTATION:  DATE OF DISCHARGE:                                   CONSULTATION   A 77 year old white female returns for continuing followup of ovarian  cancer.   INTERVAL HISTORY:  Since her last visit, the patient has done well. She  denies any GI or GU symptoms, has no pelvic pain, pressure or vaginal  bleeding or discharge.   HISTORY OF PRESENT ILLNESS:  The patient underwent initial surgery in April  of 2005 with a finding of ovarian cancer.  She had comprehensive staging and  was found to be a stage 1A grade 2.  She also had a concurrent sigmoid  resection with anastomosis by Dr. Zachery Dakins for a perforated sigmoid  diverticulum.   PAST MEDICAL HISTORY:  Macular degeneration, hyperlipidemia, hypothyroidism,  hiatal hernia and obesity.   ALLERGIES:  None.   MEDICATIONS:  Reviewed and unchanged from prior medication list.   PAST SURGICAL HISTORY:  Reconstruction of left elbow, laparoscopic  cholecystectomy and repair of hiatal hernia, ovarian cancer staging, sigmoid  colectomy for diverticulitis.   OBSTETRICAL HISTORY:  Gravida 2.   REVIEW OF SYMPTOMS:  Negative except as noted above.   FAMILY HISTORY:  The patient is married, she does not smoke.   PHYSICAL EXAMINATION:  VITAL SIGNS:  Weight 199 pounds, blood pressure  130/76.  GENERAL:  The patient is a healthy white female in no acute distress.  HEENT:  Negative.  NECK:  Supple without thyromegaly. There was no supraclavicular or inguinal  adenopathy.  ABDOMEN:  Soft, nontender, no mass, organomegaly or ascites o are noted.  She does have an umbilical hernia which is easily reducible.  PELVIC:  EGBUS, vagina, bladder, urethra are normal. Cervix and uterus  surgically absent.  Adnexa without masses.  Rectovaginal exam confirms.   IMPRESSION:  Stage 1A grade 2 ovarian cancer status post staging and  resection, no evidence of recurrent disease.   PLAN:  CA 125 is obtained today.  The patient will return to see [place] in three  months for continuing followup.     Dani   DC/MEDQ  D:  12/09/2004  T:  12/09/2004  Job:  [id number]   cc:   Larina Earthly, M.D.  [address]  [place]  [place] [address]  Fax: [phone]   Anselm Pancoast. Zachery Dakins, M.D.  [address], [address]  [place]  [place] [address]  Fax: [phone]   Telford Nab, R.N.  [phone] [address]  [place], [place] [address]

## 2011-05-08 NOTE — Consult Note (Signed)
[name], [name]                ACCOUNT NO.:  [id number]   MEDICAL RECORD NO.:  [id number]          PATIENT TYPE:  OUT   LOCATION:  GYN                          FACILITY:  [facility]   PHYSICIAN:  De Blanch, M.D.DATE OF BIRTH:  [birth year]   DATE OF CONSULTATION:  DATE OF DISCHARGE:                                   CONSULTATION   A 77 year old white female returns for continued followup of ovarian cancer.   INTERVAL HISTORY:  Since her last visit, she has done well.  She denies any  GI or GU symptoms.  She has no pelvic pain, pressure, vaginal bleeding or  discharge.  Her main complaint is that she is having worsening pain and  difficulty with her artificial elbow on the left.  She is scheduled to see  her orthopedic surgeon in early November.  From a gynecologic point of view,  she denies any pelvic pain or pressure, vaginal bleeding or discharge, and  overall, her functional status has been good.  It is noted that she had a CA-  125 in April, which was 6.3 u/ml.   HISTORY OF PRESENT ILLNESS:  Patient has stage IA, grade II ovarian cancer.  Operated on in April, 2005.  She had a sigmoid resection with anastomosis by  Dr. Zachery Dakins because of a perforated sigmoid diverticulum.  She has not  received any adjuvant therapy.   PAST MEDICAL HISTORY:  1.  Macular degeneration.  2.  Hyperlipidemia.  3.  Hypothyroidism.  4.  Hiatal hernia.  5.  Obesity.   DRUG ALLERGIES:  None.   PAST SURGICAL HISTORY:  1.  Reconstruction of left elbow.  2.  Laparoscopic cholecystectomy.  3.  Repair of hiatal hernia.  4.  Ovarian cancer resection and staging.  5.  Sigmoid colectomy for diverticulitis.   CURRENT MEDICATIONS:  Reviewed and unchanged from previous notations in the  record.   OBSTETRICAL HISTORY:  Gravida 2.   REVIEW OF SYSTEMS:  A 10-point comprehensive review of systems is negative  except for symptoms noted above, predominantly those of her elbow.   PHYSICAL  EXAMINATION:  VITAL SIGNS:  Weight 198 pounds.  Blood pressure  130/80, pulse 80, respiratory rate 20.  GENERAL:  Patient is a healthy white female in no acute distress.  HEENT:  Negative.  NECK:  Supple without thyromegaly.  There is no suprapubic or inguinal  adenopathy.  MUSCULOSKELETAL:  She has limited range of motion in her left elbow and some  pain.  ABDOMEN:  Obese, soft, nontender.  No masses, organomegaly, ascites, or  hernias are noted.  PELVIC:  EG/BUS, vagina, bladder, and urethra are normal.  The cervix and  uterus are surgically absent.  Adnexa are without masses.  Rectovaginal exam  confirms.  EXTREMITIES:  Lower extremities are without edema and varicosities.   IMPRESSION:  Stage IA, grade II ovarian cancer.  No evidence for recurrent  disease.   PLAN:  A CA-125 is obtained today.  Patient will return for followup in six  months.      De Blanch, M.D.  Electronically Signed  DC/MEDQ  D:  10/02/2005  T:  10/02/2005  Job:  [id number]   cc:   Anselm Pancoast. Zachery Dakins, M.D.  [address]  [place]  [place] [address]   Larina Earthly, M.D.  Fax: [phone]   Telford Nab, R.N.  [address]  [place], [place] [address]

## 2011-05-08 NOTE — Consult Note (Signed)
NAMEFRUMA, AFRICA                ACCOUNT NO.:  [id number]   MEDICAL RECORD NO.:  [id number]          PATIENT TYPE:  OUT   LOCATION:  GYN                          FACILITY:  [facility]   PHYSICIAN:  De Blanch, M.D.DATE OF BIRTH:  09-22-1931   DATE OF CONSULTATION:  10/15/2006  DATE OF DISCHARGE:                                   CONSULTATION   CHIEF COMPLAINT:  Ovarian cancer.   INTERVAL HISTORY:  Since her last visit, the patient has done well.  She  denies any GI or GU symptoms.  She has had no pelvic pain, pressure, vaginal  bleeding, or discharge.  The only change in her health status is a new  diagnosis of hypertension for which she has been placed on Metoprolol.  Otherwise, she is very active and [place]   HISTORY OF PRESENT ILLNESS:  The patient was found to have a stage IA grade  2 ovarian cancer April 2005.  She also had a perforated sigmoid diverticulum  which was resected and reanastomosis by Dr. Consuello Bossier.  Given her  good prognostic features, she has not received any adjuvant therapy.   PAST MEDICAL HISTORY:  Hypertension, macular degeneration, hyperlipidemia,  hypothyroidism, hiatal hernia, obesity.   ALLERGIES:  No drug allergies.   PAST SURGICAL HISTORY:  Laparoscopic cholecystectomy, repair of hiatal  hernia, ovarian cancer resection and staging, sigmoid colectomy for  diverticulitis.   CURRENT MEDICATIONS:  These are reviewed and unchanged from previous  notations.  Please add to this Metoprolol.   OBSTETRICAL HISTORY:  Gravida 2.   REVIEW OF SYSTEMS:  10-point comprehensive review of systems negative except  as noted above.   PHYSICAL EXAMINATION:  VITAL SIGNS:  Weight 200 pounds.  ABDOMEN:  Obese, soft, nontender.  No mass, organomegaly, ascites or herniae  are noted.  PELVIC:  EG/BUS, vagina, and urethra are normal.  Cervix and uterus  surgically absent.  Adnexa without masses.  Rectovaginal exam  confirms.  EXTREMITIES:  Lower extremities without edema or varicosities.   IMPRESSION:  Stage IA grade 2 ovarian cancer.  No evidence of recurrent  disease.   The patient will have mammograms obtained today.  She will return to see me  in six months for continuing follow-up.      De Blanch, M.D.  Electronically Signed     DC/MEDQ  D:  10/15/2006  T:  10/16/2006  Job:  [id number]   cc:   Telford Nab, R.N.  [address]  [place], [place] [address]   Larina Earthly, M.D.  Fax: [phone]

## 2011-05-08 NOTE — Consult Note (Signed)
[name], [name]                ACCOUNT NO.:  [id number]   MEDICAL RECORD NO.:  [id number]          PATIENT TYPE:  OUT   LOCATION:  GYN                          [facility]ACILITY:  [facility]   PHYSICIAN:  De Blanch, M.D.DATE OF BIRTH:  07/22/1931   DATE OF CONSULTATION:  09/09/2004  DATE OF DISCHARGE:                                   CONSULTATION   A37 year old white female returns for continuing follow-up of a stage IA  grade 2 ovarian cancer.  She underwent initial surgery in April 2005,  including comprehensive staging.  Given her good prognostic features, no  additional therapy was recommended.  The patient also had perforated sigmoid  diverticulum at the same time, requiring a sigmoid colectomy and anastomosis  by Dr. Consuello Bossier.   Current medications are reviewed and are unchanged from her prior medication  list.   PAST MEDICAL ILLNESSES:  1.  Macular degeneration.  2.  Hyperlipidemia.  3.  Hypothyroidism.  4.  Hiatal hernia.  5.  Obesity.   DRUG ALLERGIES:  None.   PAST SURGICAL HISTORY:  1.  Reconstruction of her left elbow in 1999.  2.  Laparoscopic cholecystectomy and repair of hiatal hernia.  3.  Ovarian cancer staging.  4.  Sigmoid colectomy (diverticulitis).   OBSTETRICAL HISTORY:  Gravida 2.   REVIEW OF SYSTEMS:  Negative except as noted above.   SOCIAL HISTORY:  The patient is married.  She does not smoke or drink.   PHYSICAL EXAMINATION:  GENERAL:  The patient is a healthy white female in no  acute distress.  HEENT:  Negative.  NECK:  Supple without thyromegaly.  There is no supraclavicular or inguinal  adenopathy.  ABDOMEN:  Soft, nontender.  No mass, organomegaly, ascites, or hernias  noted.  PELVIC:  EG/BUS, vagina, bladder, urethra are normal.  The vagina has no  lesions.  Bimanual and rectovaginal exam reveal no mass, induration, or  nodularity.   IMPRESSION:  Stage IA, grade 2 ovarian cancer.   CA 125 will be obtained today.  The  patient will return to see [place] in 3  months for continuing surveillance.     Dani   DC/MEDQ  D:  09/09/2004  T:  09/10/2004  Job:  [id number]   cc:   Hartley Barefoot, MD   Anselm Pancoast. Zachery Dakins, M.D.  [address]  [place]  [place] [address]  Fax: [phone]   Telford Nab, R.N.  [phone]  [place], [place] [address]

## 2011-05-08 NOTE — Op Note (Signed)
[name], Wade                         ACCOUNT NO.:  [id number]   MEDICAL RECORD NO.:  [id number]                   PATIENT TYPE:  INP   LOCATION:  X001                                 FACILITY:  [facility]   PHYSICIAN:  Thornton Park. Daphine Deutscher, M.D.             DATE OF BIRTH:  31-Dec-1930   DATE OF PROCEDURE:  08/22/2002  DATE OF DISCHARGE:                                 OPERATIVE REPORT   CCS#:  [id number]   PREOPERATIVE DIAGNOSES:  Large type 3 mixed hiatal hernia with history of  anemia and large visible gallstone.   POSTOPERATIVE DIAGNOSES:  Huge type 3 mixed hiatal hernia with intrathoracic  stomach and chronic cholecystitis.   PROCEDURE:  Laparoscopic reduction taken down of huge type 3 mixed hiatal  hernia with closure of the hiatus posteriorly 5 and anteriorly 1 over a #50  lighted dilator with laparoscopic Nissen fundoplication (3) and laparoscopic  cholecystectomy and intraoperative cholangiogram.   SURGEON:  Thornton Park. Daphine Deutscher, M.D.   ASSISTANTRiley Lam A. Magnus Ivan, M.D.   ANESTHESIA:  General endotracheal.   OPERATIVE TIME:  Three hours.   DESCRIPTION OF PROCEDURE:  [name] was taken to room one and given  general anesthesia. She was prepped from above the breast the pubis and  draped sterilely. A longitudinal incision was made above the umbilicus and  through a pursestring suture, I inserted the Hasson cannula and then after  insufflation placed a 5 mm in the upper midline, two 10/11 along the right  side and one on the left.   We initially went upward grasping the stomach and were able to pull it down  although it retracted medially back up into the chest. I then incised the  hepatogastric ligament, went up and dissected the right crus. I carried that  dissection anteriorly across the middle portion of the anterior crus.   Next, we went over and grasped the stomach along the fundus and greater  curvature and using the harmonic scalpel took down the short  gastric vessels  and marked out way up to the diaphragm hiatus and on up into the chest.  There we teased the cardia and fundus out of the chest and took down the  attachments and the sac that was holding it up inside. Once we did that, we  were able to get around behind it and put a Penrose drain around the  esophagus and hold it in on traction. I identified the anterior vagus and  then the posterior vagus. The posterior crus was then delineated nicely.   We placed five stitches posteriorly with two with pledgets and tied these  down. This pretty well obliterated the hiatal hernia. I also placed a suture  anteriorly and then we passed a #50 lighted bougie which slid into the  stomach nicely. With that in place, we then went around behind the esophagus  and grasped the fundus and brought it  around to create the Nissen  fundoplication. A contiguous portion of the fundus was used and this seemed  to come around without tension. When I had this lined up, I removed the  bougie and then placed my top suture getting a good purchase of stomach,  esophagus and stomach and tied that down extracorporeally. Two subsequent  sutures, both intracorporeal ties were placed and tied down yielding a nice  healthy wrap affixed to the distal esophagus.   We then repositioned our scope and our position on the table and grasped the  gallbladder and elevated it. We dissected out the cystic duct and put a clip  upon the gallbladder. We made a small incision in the cystic duct and  inserted the Reddick catheter and took a dynamic cholangiogram. This showed  a good proximal filling a common duct and intrahepatic ducts with free flow  into the duodenum. The cystic duct was then triply clipped and divided. The  cystic artery was triple clipped and divided and then the gallbladder was  removed from gallbladder bed using the hook. The gallbladder bed was  irrigated and cauterized. No active bleeding was seen and it  looked good.  The gallbladder was placed in a gallbladder and brought out through the  supraumbilical port.   We reinspected the wrap as well as the liver and everything appeared to be  in order. We injected all the trocars with 0.5% Marcaine. We tied down the  supraumbilical port under direct vision and it seemed to close nicely. The  other ports had been placed obliquely and seemed to close up as we removed  them. The abdomen was desufflated. The patient had her skin wounds closed  with 4-0 Vicryl with Benzoin and Steri-Strips. The patient seemed to  tolerate the procedure well and was taken to the recovery room in  satisfactory condition.                                                Thornton Park Daphine Deutscher, M.D.    MBM/MEDQ  D:  08/22/2002  T:  08/22/2002  Job:  [id number]   cc:   Lennon Alstrom. Felipa Eth, M.D.   Florencia Reasons, M.D.  [address] [place]  [place], [place] [address]  Fax: [phone]

## 2011-05-08 NOTE — Discharge Summary (Signed)
   NAMERIVA, [name]                          ACCOUNT NO.:  [id number]   MEDICAL RECORD NO.:  [id number]                   PATIENT TYPE:  INP   LOCATION:  0476                                 FACILITY:  [facility]   PHYSICIAN:  Thornton Park. Daphine Deutscher, M.D.             DATE OF BIRTH:  Nov 09, 1931   DATE OF ADMISSION:  08/22/2002  DATE OF DISCHARGE:  08/25/2002                                 DISCHARGE SUMMARY   ADMISSION DIAGNOSES:  1. Huge type 3 mixed hiatal hernia.  2. Gallstones.   PROCEDURES:  1. Laparoscopic Nissan fundoplication.  2. Closure of huge type 3 mixed hiatal hernia.  3. Cholecystectomy.  4. Intraoperative cholangiogram.   HOSPITAL COURSE:  The patient was admitted after the above-mentioned  operations.  She had an upper GI swallow done on August 23, 2002, which  showed no evidence of leak.  She did well and was ready for discharge on  August 25, 2002.  She was given Mepergan Fortis to take for pain.  She  will be followed up in the office in three weeks.                                               Thornton Park Daphine Deutscher, M.D.    MBM/MEDQ  D:  09/20/2002  T:  09/20/2002  Job:  [id number]

## 2011-05-08 NOTE — Op Note (Signed)
[name], [name] NO.:  [id number]   MEDICAL RECORD NO.:  [id number]                   PATIENT TYPE:  INP   LOCATION:  0002                                 FACILITY:  [facility]   PHYSICIAN:  De Blanch, M.D.         DATE OF BIRTH:  February 02, 1931   DATE OF PROCEDURE:  DATE OF DISCHARGE:                                 OPERATIVE REPORT   PREOPERATIVE DIAGNOSIS:  1. Complex pelvic mass.  2. Diverticulitis.   POSTOPERATIVE DIAGNOSIS:  1. Mixed ovarian carcinoma, left ovary.  2. Perforated sigmoid colon.   PROCEDURE:  1. Exploratory laparotomy.  2. Total abdominal hysterectomy with bilateral salpingo-oophorectomy.  3. Pelvic and periaortic lymphadenectomy, omentectomy.  4. Multiple peritoneal biopsies.  5. Anterior resection of the sigmoid colon (Dr. Consuello Bossier).   SURGEON:  De Blanch, M.D.   ASSISTANT:  Roseanna Rainbow, M.D., Telford Nab, RN   ANESTHESIA:  General with orotracheal tube.   ESTIMATED BLOOD LOSS:  450 cc for the entire operation.   SURGICAL FINDINGS:  At the time of exploratory laparotomy, there was an 8 cm  cystic lesion arising from the left ovary and a 6 cm cystic lesion arising  from the left ovary.  A frozen section of the left ovary revealed an  intracystic solid area which contained a mixed ovarian carcinoma.  The right  ovary was benign.  Exploration of the upper abdomen included the diaphragm,  liver, spleen, stomach, omentum, large and small bowel, and appendix were  all normal.  There were no enlarged pelvic or periaortic lymph nodes.  The  sigmoid colon was densely adherent to the mesentery of the ileum with  evidence of perforation of the colon.  There was no free stool or pus.  No  abscess formation outside of the connection between the sigmoid colon and  the small bowel.  At the completion of the surgical procedure, there was no  gross residual disease.   PROCEDURE:  Patient  is brought to the operating room and after satisfactory  general anesthesia, was placed in the modified lithotomy position in [place]  stirrups.  The anterior abdominal wall, perineum and vagina were prepped  with Betadine.  A Foley catheter was inserted, and the patient was draped.  The abdominal cavity was entered through a midline incision.  Peritoneal  washings were obtained from the pelvis.  The upper abdomen and pelvis were  explored with the above-noted findings.  A Bookwalter retractor was  assembled, and the bowel was packed out of the pelvis.  The sigmoid  adhesions adherent to the ileum was noted, and general surgical consultation  was obtained.   The left retroperitoneal space in the pelvis was opened.  I identified the  vessels and ureter.  The ovarian vessels were skeletonized, clamped, cut,  free-tied, and suture ligated.  The broad ligament was incised beneath the  ovarian cyst, which was not adherent to any other structures.  The uterus  was grasped with long Kelly clamps, and a parametrial clamp was placed  across the tube and meso-ovarium and then divided.  The left tube and ovary  were submitted to pathology for frozen section.  A similar procedure was  performed on the right side of the pelvis.  This ovary and tube were also  handed to pathology for frozen section.  A bladder flap was advanced to  sharp and blunt dissection.  The uterine vessels were skeletonized, clamped,  cut, and suture ligated.  In a stepwise fashion, the paracervical and  cardinal ligaments were clamped, cut, and suture ligated.  The vaginal  angles were cross clamped and divided and the vagina transected from its  connection to the cervix.  Vaginal angles were transfixed with 0 Vicryl, and  the central portion of the vagina were closed with an interrupted figure-of-  eight sutures of 0 Vicryl.   Frozen section returned, showing this to be an ovarian cancer, and further  surgical staging was  planned.   Dr. Consuello Bossier was consulted, who performed an anterior resection of  the sigmoid colon, to be dictated separately.   We proceeded with further surgical staging.  Peritoneal biopsies were  obtained from the right and left pelvic side wall.  Anterior and posterior  cul de sacs and right and left pericolic gutters.  A Pap smear was obtained  from the right diaphragm.   Pelvic lymphadenectomy was performed in the left side of the pelvis,  excising lymph nodes from the external iliac artery and vein, internal iliac  artery, and obturator fossa.  The obturator nerve was identified and  preserved throughout the dissection.  Hemostasis was achieved with cautery  and hemoclips.  We proceeded up the left common iliac artery and along the  left side of the aorta, mobilizing the left colon medially.  The right and  left ureters were further mobilized, and the left side of the aorta  identified.  Dissection was carried along the left side of the aorta up to  near the renal vessels.  This was all submitted as periaortic lymph nodes.  Hemostasis was achieved with cautery and hemoclips.  All surgical sites were  reinspected and found to be hemostatic.  Because the pathology and the  ovarian tumor revealed a mucinous component, it was felt appropriate to  perform an appendectomy.  The mesoappendix was clamped, divided, and suture  ligated, incorporating the appendiceal artery.  The base of the appendix was  cross-clamped, divided, and suture ligated.  A purse string suture of 2-0  Vicryl suture was then placed, and the appendiceal stump inverted.  The  abdomen and appendix were irrigated with copious amounts of warm saline.  The pelvis was reinspected and found to be hemostatic.  The suture line and  mesenteric closure of the sigmoid colon were reinspected and found to be  intact.   The retractors and packs were removed, and the anterior abdominal wall closed in layers with the  first being a running mass closure using #1 PDS.  Subcutaneous tissue was irrigated and hemostasis was achieved with cautery.  The subcutaneous tissue reapproximated with interrupted 3-0 Vicryl sutures.  The skin was closed with skin staples.  A dressing was applied.  The patient  was awakened from anesthesia and taken to the recovery room in satisfactory  condition.  Sponge, needle, and instrument counts were correct x2.  De Blanch, M.D.    DC/MEDQ  D:  04/01/2004  T:  04/01/2004  Job:  [id number]   cc:   Roseanna Rainbow, M.D.   Anselm Pancoast. Zachery Dakins, M.D.  [address]  [place]  [place] [address]  Fax: [phone]   Telford Nab, R.N.  [phone] [address]  [place], [place] [address]   Larina Earthly, M.D.  [address]  [place]  [place] [address]  Fax: [phone]

## 2011-07-03 ENCOUNTER — Other Ambulatory Visit: Payer: Self-pay | Admitting: Cardiology

## 2011-07-03 NOTE — Telephone Encounter (Signed)
escribe medication per fax request  

## 2011-09-15 ENCOUNTER — Telehealth: Payer: Self-pay | Admitting: *Deleted

## 2011-09-15 MED ORDER — LOSARTAN POTASSIUM-HCTZ 100-25 MG PO TABS: 1.0000 | ORAL_TABLET | Freq: Every day | ORAL | Status: DC

## 2011-09-15 NOTE — Telephone Encounter (Signed)
Called wanting to know if we had samples of Micardis 80/25.  No longer receiving samples. Checked w/Dr. Swaziland and will change her to Losartan/HCTZ 100/25. [facility] [address].

## 2011-10-19 ENCOUNTER — Other Ambulatory Visit: Payer: Self-pay | Admitting: Cardiology

## 2011-10-19 ENCOUNTER — Telehealth: Payer: Self-pay | Admitting: Cardiology

## 2011-10-19 NOTE — Telephone Encounter (Signed)
Unsure who left comment on refill

## 2011-10-19 NOTE — Telephone Encounter (Signed)
New message:  Pt states blood pressure is sky high.

## 2011-10-19 NOTE — Telephone Encounter (Signed)
Called stating that since changing her Micardis to Losartan 100/25 her BP is high at times. Over the last few weeks BP has been 186/68; 178/75. She talked with pharmacist and was told that Avapro was only med on her drug plan. Also spoke w/pharmacist and she stated gen diovan or availide was on plan. spoke w/ Norma Fredrickson, NP and she advised to increase amlodipine to 5 mg and stay on Losartan 100/25. Made her an app to see Dr. Swaziland Nov 19th. Will continue to monitor BP and let us know if continues to stay high.

## 2011-10-19 NOTE — Telephone Encounter (Signed)
escribe medication per fax request  

## 2011-10-19 NOTE — Telephone Encounter (Signed)
Pt needs to speak to you regarding the medication you told her to double.

## 2011-11-09 ENCOUNTER — Encounter: Payer: Self-pay | Admitting: Cardiology

## 2011-11-09 ENCOUNTER — Ambulatory Visit (INDEPENDENT_AMBULATORY_CARE_PROVIDER_SITE_OTHER): Payer: Medicare Other | Admitting: Cardiology

## 2011-11-09 VITALS — BP 126/64 | HR 47 | Ht 62.5 in | Wt 195.0 lb

## 2011-11-09 DIAGNOSIS — R001 Bradycardia, unspecified: Secondary | ICD-10-CM | POA: Insufficient documentation

## 2011-11-09 DIAGNOSIS — I498 Other specified cardiac arrhythmias: Secondary | ICD-10-CM

## 2011-11-09 DIAGNOSIS — I10 Essential (primary) hypertension: Secondary | ICD-10-CM | POA: Insufficient documentation

## 2011-11-09 DIAGNOSIS — I452 Bifascicular block: Secondary | ICD-10-CM

## 2011-11-09 MED ORDER — LOSARTAN POTASSIUM-HCTZ 100-25 MG PO TABS: 1.0000 | ORAL_TABLET | Freq: Every day | ORAL | Status: DC

## 2011-11-09 NOTE — Assessment & Plan Note (Signed)
Her heart rate is slow today but she reports her heart rate is usually in the 60s. She is asymptomatic. I asked her to monitor this and if her heart rate remains below 50 we may need to stop her metoprolol. I will plan a followup again in 6 months.

## 2011-11-09 NOTE — Progress Notes (Signed)
[name] Date of Birth: [birth year] Medical Record #[id number]  History of Present Illness: [name] is seen for evaluation of hypertension today. She has a history of hypertension, hyperlipidemia, and right bundle branch block. Recently she had significant elevation in her blood pressure readings. She had been switched from myocardis to losartan HCT because of cost concerns. Her blood pressure was still elevated. She has since been started on amlodipine with improvement in her blood pressure readings. She denies any significant chest pain, dyspnea, or edema. She's had no headache.  Current Outpatient Prescriptions on File Prior to Visit  Medication Sig Dispense Refill  . amLODipine (NORVASC) 5 MG tablet Take 1 tablet (5 mg total) by mouth daily.  30 tablet  5  . CRESTOR 20 MG tablet Take 20 mg by mouth daily.       . hydrochlorothiazide (HYDRODIURIL) 25 MG tablet Take 25 mg by mouth daily.       Marland Kitchen levothyroxine (SYNTHROID, LEVOTHROID) 200 MCG tablet Take 200 mcg by mouth daily.       . metoprolol tartrate (LOPRESSOR) 25 MG tablet TAKE 1 TABLET TWICE DAILY  60 tablet  5  . DISCONTD: losartan-hydrochlorothiazide (HYZAAR) 100-25 MG per tablet Take 1 tablet by mouth daily.  30 tablet  5    No Known Allergies  Past Medical History  Diagnosis Date  . Ovarian cancer     Stage IA grade 2 ovarian cancer  . Obesity   . Hypertension   . Macular degeneration   . Hyperlipidemia   . Hypothyroidism   . Hiatal hernia     Past Surgical History  Procedure Date  . Elbow arthroplasty     replacing the humeral stem  . Cholecystectomy, laparoscopic   . Elbow replacement   . Colectomy      Rectosigmoid colectomy with reanastomosis  . Other surgical history   . Other surgical history     Ovarian cancer resection and staging    History  Smoking status  . Never Smoker   Smokeless tobacco  . Not on file    History  Alcohol Use No    Family History  Problem Relation Age of Onset    . Heart attack Father     Review of Systems: As noted in history of present illness.  All other systems were reviewed and are negative.  Physical Exam: BP 126/64  Pulse 47  Ht 5' 2.5" (1.588 m)  Wt 195 lb (88.451 kg)  BMI 35.10 kg/m2 She is an obese, elderly female in no acute distress.The patient is alert and oriented x 3.  The mood and affect are normal.  The skin is warm and dry.  Color is normal.  The HEENT exam reveals that the sclera are nonicteric.  The mucous membranes are moist.  The carotids are 2+ without bruits.  There is no thyromegaly.  There is no JVD.  The lungs are clear.  The chest wall is non tender.  The heart exam reveals a regular rate with a normal S1 and S2.  There are no murmurs, gallops, or rubs.  The PMI is not displaced.   Abdominal exam reveals good bowel sounds.  There is no guarding or rebound.  There is no hepatosplenomegaly or tenderness.  There are no masses.  Exam of the legs reveal no clubbing, cyanosis, or edema.  The legs are without rashes.  The distal pulses are intact.  Cranial nerves II - XII are intact.  Motor and sensory functions  are intact.  The gait is normal.  LABORATORY DATA:  ECG today demonstrates sinus bradycardia with a rate of 44 beats per minute. Shows a right bundle branch block. Assessment / Plan:

## 2011-11-09 NOTE — Assessment & Plan Note (Signed)
Blood pressure currently appears to be well controlled on a combination of losartan HCT and amlodipine. I recommended she continue on her current medical therapy. If her blood pressure should increase further we may need to switch to a stronger ARB.

## 2011-11-09 NOTE — Patient Instructions (Signed)
Continue your current medications.  Keep an eye on your blood pressure and pulse. If the blood pressure is staying over 140 or pulse less than 50 let me know.   I will see you again in 6 months.

## 2012-01-15 ENCOUNTER — Other Ambulatory Visit: Payer: Self-pay | Admitting: *Deleted

## 2012-01-15 MED ORDER — METOPROLOL TARTRATE 25 MG PO TABS: 25.0000 mg | ORAL_TABLET | Freq: Two times a day (BID) | ORAL | Status: DC

## 2012-04-11 DIAGNOSIS — H2510 Age-related nuclear cataract, unspecified eye: Secondary | ICD-10-CM | POA: Diagnosis not present

## 2012-04-11 DIAGNOSIS — H3530 Unspecified macular degeneration: Secondary | ICD-10-CM | POA: Diagnosis not present

## 2012-04-20 DIAGNOSIS — E785 Hyperlipidemia, unspecified: Secondary | ICD-10-CM | POA: Diagnosis not present

## 2012-04-20 DIAGNOSIS — E039 Hypothyroidism, unspecified: Secondary | ICD-10-CM | POA: Diagnosis not present

## 2012-04-20 DIAGNOSIS — I2510 Atherosclerotic heart disease of native coronary artery without angina pectoris: Secondary | ICD-10-CM | POA: Diagnosis not present

## 2012-04-20 DIAGNOSIS — N318 Other neuromuscular dysfunction of bladder: Secondary | ICD-10-CM | POA: Diagnosis not present

## 2012-04-21 DIAGNOSIS — I10 Essential (primary) hypertension: Secondary | ICD-10-CM | POA: Diagnosis not present

## 2012-04-21 DIAGNOSIS — E039 Hypothyroidism, unspecified: Secondary | ICD-10-CM | POA: Diagnosis not present

## 2012-04-21 DIAGNOSIS — C569 Malignant neoplasm of unspecified ovary: Secondary | ICD-10-CM | POA: Diagnosis not present

## 2012-04-25 ENCOUNTER — Other Ambulatory Visit: Payer: Self-pay | Admitting: Cardiology

## 2012-04-25 NOTE — Telephone Encounter (Signed)
Refill- AmLODipine (NORVASC) 5 MG tablet  Patient needs refill, on the way out of town and would like to pick this up ASAP.  [facility] [address].

## 2012-04-26 ENCOUNTER — Other Ambulatory Visit: Payer: Self-pay | Admitting: *Deleted

## 2012-04-26 MED ORDER — AMLODIPINE BESYLATE 5 MG PO TABS: 5.0000 mg | ORAL_TABLET | Freq: Every day | ORAL | Status: DC

## 2012-04-27 NOTE — Telephone Encounter (Signed)
Received message patient needs refill on amlodipine.Spoke to pharmacist at [facility] she stated was already refilled 04/26/12.

## 2012-05-02 ENCOUNTER — Telehealth: Payer: Self-pay | Admitting: Cardiology

## 2012-05-02 NOTE — Telephone Encounter (Signed)
Pt returning a call to Elnita Maxwell, Dr Elvis Coil nurse.  Unable to find a reason for the call. Told pt I would forward this note to to [facility], Dr Elvis Coil nurse so she can call Mrs Bermingham.

## 2012-05-02 NOTE — Telephone Encounter (Signed)
New Problem:     Patient called returning Cheryl's call.  Please call back.

## 2012-05-03 NOTE — Telephone Encounter (Signed)
Patient called, stated her husband had surgery last wed 04/27/12 at [facility] to remove a artificial vein.States he is doing good. Home health nurse dressing leg.States she had a problem last week was unable to refill amlodipine before she went to [facility] pharmacist gave her enough until her appointment 06/02/12.Wife was told will let Dr.Jordan know about husband.

## 2012-06-02 ENCOUNTER — Ambulatory Visit (INDEPENDENT_AMBULATORY_CARE_PROVIDER_SITE_OTHER): Payer: Medicare Other | Admitting: Cardiology

## 2012-06-02 ENCOUNTER — Encounter: Payer: Self-pay | Admitting: Cardiology

## 2012-06-02 VITALS — BP 130/60 | HR 54 | Ht 63.0 in | Wt 196.0 lb

## 2012-06-02 DIAGNOSIS — I10 Essential (primary) hypertension: Secondary | ICD-10-CM

## 2012-06-02 NOTE — Patient Instructions (Addendum)
Continue taking your current medication  I will see you back in 6 months.  Your physician wants you to follow-up in: 6 monthsYou will receive a reminder letter in the mail two months in advance. If you don't receive a letter, please call our office to schedule the follow-up appointment.

## 2012-06-03 NOTE — Assessment & Plan Note (Signed)
Blood pressure is under excellent control today. She is not a candidate for beta blockers because of history of bradycardia. We will continue with her current therapy. If her blood pressure should spike we could always resume losartan HCT.

## 2012-06-03 NOTE — Progress Notes (Signed)
[name] Date of Birth: 1931-11-24 Medical Record #[id number]  History of Present Illness: [name] is seen for follow up today. She has a history of hypertension, hyperlipidemia, and right bundle branch block. She reports that she is feeling very well. Her blood pressure has been doing very well at home. She is under the impression that our office called her and told her to stop taking losartan HCT. I reviewed all her records carefully including telephone conversations and we never made this recommendation. At any rate she states she is not taking it and is only on amlodipine at this time.  Current Outpatient Prescriptions on File Prior to Visit  Medication Sig Dispense Refill  . amLODipine (NORVASC) 5 MG tablet Take 1 tablet (5 mg total) by mouth daily.  30 tablet  5  . CALCIUM PO Take by mouth as directed.      . CRESTOR 20 MG tablet Take 20 mg by mouth daily.       Marland Kitchen levothyroxine (SYNTHROID, LEVOTHROID) 200 MCG tablet Take 200 mcg by mouth daily.       . metoprolol tartrate (LOPRESSOR) 25 MG tablet Take 1 tablet (25 mg total) by mouth 2 (two) times daily.  60 tablet  5  . niacin (NIASPAN) 500 MG CR tablet Take 500 mg by mouth as directed.      Marland Kitchen losartan-hydrochlorothiazide (HYZAAR) 100-25 MG per tablet Take 1 tablet by mouth daily.  30 tablet  5    No Known Allergies  Past Medical History  Diagnosis Date  . Ovarian cancer     Stage IA grade 2 ovarian cancer  . Obesity   . Hypertension   . Macular degeneration   . Hyperlipidemia   . Hypothyroidism   . Hiatal hernia     Past Surgical History  Procedure Date  . Elbow arthroplasty     replacing the humeral stem  . Cholecystectomy, laparoscopic   . Elbow replacement   . Colectomy      Rectosigmoid colectomy with reanastomosis  . Other surgical history   . Other surgical history     Ovarian cancer resection and staging    History  Smoking status  . Never Smoker   Smokeless tobacco  . Not on file    History   Alcohol Use No    Family History  Problem Relation Age of Onset  . Heart attack Father     Review of Systems: As noted in history of present illness.  All other systems were reviewed and are negative.  Physical Exam: BP 130/60  Pulse 54  Ht 5\' 3"  (1.6 m)  Wt 88.905 kg (196 lb)  BMI 34.72 kg/m2  SpO2 95% She is an obese, elderly female in no acute distress.The patient is alert and oriented x 3.    The skin is warm and dry.  Color is normal.  The HEENT exam is normal. The carotids are 2+ without bruits.  There is no thyromegaly.  There is no JVD.  The lungs are clear.  The chest wall is non tender.  The heart exam reveals a regular rate with a normal S1 and S2.  There are no murmurs, gallops, or rubs.  The PMI is not displaced.   Abdominal exam reveals good bowel sounds.  There is no guarding or rebound.  There is no hepatosplenomegaly or tenderness.  There are no masses.  Exam of the legs reveal no clubbing, cyanosis, or edema.  The legs are without rashes.  The distal pulses are intact.  Cranial nerves II - XII are intact.  Motor and sensory functions are intact.  The gait is normal.  LABORATORY DATA:   Assessment / Plan:

## 2012-07-26 ENCOUNTER — Other Ambulatory Visit: Payer: Self-pay | Admitting: Cardiology

## 2012-07-26 MED ORDER — METOPROLOL TARTRATE 25 MG PO TABS: 25.0000 mg | ORAL_TABLET | Freq: Two times a day (BID) | ORAL | Status: DC

## 2012-08-29 DIAGNOSIS — Z23 Encounter for immunization: Secondary | ICD-10-CM | POA: Diagnosis not present

## 2012-09-02 ENCOUNTER — Other Ambulatory Visit: Payer: Self-pay

## 2012-09-02 MED ORDER — AMLODIPINE BESYLATE 5 MG PO TABS: 5.0000 mg | ORAL_TABLET | Freq: Every day | ORAL | Status: DC

## 2012-09-16 ENCOUNTER — Telehealth: Payer: Self-pay | Admitting: Cardiology

## 2012-09-16 NOTE — Telephone Encounter (Signed)
Pt was told to call if BP runs high, last night 189/81 , this am 184/72 , pls advise

## 2012-09-16 NOTE — Telephone Encounter (Signed)
Pt BP this morning prior to med's was 184/72, had her retake 1 hour after med's and was 145/66 p 52, reviewed med's and she is taking as prescribed, asked pt to take BP 1 hr after morning med's and then one time in evening, discussed high sodium foods and caffeine reduction, told pt to keep record of BP/p for 4-5 days and call back with recordings so Dr Swaziland can adjust med's if need, told pt BP not dangerously high but med's may need to be adjusted, told pt to call with further questions or concerns. Pt agreed to plan, will forward to Dr/Nurse

## 2012-09-21 NOTE — Telephone Encounter (Signed)
Spoke to patient 09/20/12 she stated her B/P was better.[place] 143/73 p-49,138/76 p-55,146/69 p-60,136/61 p- 73.Dr.Jordan reviewed B/P's,advised to continue same medications.Advised to call back if needed.

## 2012-11-14 ENCOUNTER — Ambulatory Visit: Payer: Medicare Other | Admitting: Cardiology

## 2012-11-16 DIAGNOSIS — Z1331 Encounter for screening for depression: Secondary | ICD-10-CM | POA: Diagnosis not present

## 2012-11-16 DIAGNOSIS — I10 Essential (primary) hypertension: Secondary | ICD-10-CM | POA: Diagnosis not present

## 2012-11-16 DIAGNOSIS — D509 Iron deficiency anemia, unspecified: Secondary | ICD-10-CM | POA: Diagnosis not present

## 2012-11-16 DIAGNOSIS — E039 Hypothyroidism, unspecified: Secondary | ICD-10-CM | POA: Diagnosis not present

## 2012-11-23 DIAGNOSIS — Z1231 Encounter for screening mammogram for malignant neoplasm of breast: Secondary | ICD-10-CM | POA: Diagnosis not present

## 2012-12-09 ENCOUNTER — Ambulatory Visit (INDEPENDENT_AMBULATORY_CARE_PROVIDER_SITE_OTHER): Payer: Medicare Other | Admitting: Cardiology

## 2012-12-09 ENCOUNTER — Encounter: Payer: Self-pay | Admitting: Cardiology

## 2012-12-09 VITALS — BP 142/94 | HR 60 | Ht 62.0 in | Wt 195.8 lb

## 2012-12-09 DIAGNOSIS — I4510 Unspecified right bundle-branch block: Secondary | ICD-10-CM | POA: Insufficient documentation

## 2012-12-09 DIAGNOSIS — I10 Essential (primary) hypertension: Secondary | ICD-10-CM

## 2012-12-09 MED ORDER — METOPROLOL TARTRATE 25 MG PO TABS: 25.0000 mg | ORAL_TABLET | Freq: Two times a day (BID) | ORAL | Status: DC

## 2012-12-09 MED ORDER — IRBESARTAN 150 MG PO TABS: 150.0000 mg | ORAL_TABLET | Freq: Every day | ORAL | Status: DC

## 2012-12-09 MED ORDER — HYDROCHLOROTHIAZIDE 25 MG PO TABS: 25.0000 mg | ORAL_TABLET | Freq: Every day | ORAL | Status: DC

## 2012-12-09 NOTE — Progress Notes (Signed)
[name] Date of Birth: 24-Jun-1931 Medical Record #[id number]  History of Present Illness: [name] a l is seen for follow up today. She has a history of hypertension, hyperlipidemia, and right bundle branch block. On followup today she states she is feeling well. She's had no significant symptoms of chest pain, shortness of breath, or palpitations. When seen last month by Dr. Felipa Wade her blood pressure was high and she was switched from losartan to irbesartan 150 mg daily. She states that when she checks her blood pressure at home and has been 130/65.  Current Outpatient Prescriptions on File Prior to Visit  Medication Sig Dispense Refill  . Alendronate Sodium (FOSAMAX PO) Take by mouth once a week.      Marland Kitchen amLODipine (NORVASC) 5 MG tablet Take 1 tablet (5 mg total) by mouth daily.  30 tablet  5  . aspirin 81 MG tablet Take 81 mg by mouth daily.      . beta carotene w/minerals (OCUVITE) tablet Take 1 tablet by mouth daily.      Marland Kitchen CALCIUM PO Take by mouth as directed.      . CRESTOR 20 MG tablet Take 20 mg by mouth daily.       [name] Calcium (STOOL SOFTENER PO) Take by mouth as directed.      Marland Kitchen levothyroxine (SYNTHROID, LEVOTHROID) 200 MCG tablet Take 200 mcg by mouth daily.       . Multiple Vitamin (MULTIVITAMIN) tablet Take 1 tablet by mouth daily.      . niacin (NIASPAN) 500 MG CR tablet Take 500 mg by mouth as directed.      . irbesartan (AVAPRO) 150 MG tablet Take 1 tablet (150 mg total) by mouth at bedtime.        No Known Allergies  Past Medical History  Diagnosis Date  . Ovarian cancer     Stage IA grade 2 ovarian cancer  . Obesity   . Hypertension   . Macular degeneration   . Hyperlipidemia   . Hypothyroidism   . Hiatal hernia     Past Surgical History  Procedure Date  . Elbow arthroplasty     replacing the humeral stem  . Cholecystectomy, laparoscopic   . Elbow replacement   . Colectomy      Rectosigmoid colectomy with reanastomosis  . Other surgical  history   . Other surgical history     Ovarian cancer resection and staging    History  Smoking status  . Never Smoker   Smokeless tobacco  . Not on file    History  Alcohol Use No    Family History  Problem Relation Age of Onset  . Heart attack Father     Review of Systems: As noted in history of present illness.  All other systems were reviewed and are negative.  Physical Exam: BP 142/94  Pulse 60  Ht 5\' 2"  (1.575 m)  Wt 195 lb 12.8 oz (88.814 kg)  BMI 35.81 kg/m2  SpO2 94% She is an obese, elderly female in no acute distress.The patient is alert and oriented x 3.    The skin is warm and dry.  The HEENT exam is normal. The carotids are 2+ without bruits.  There is no thyromegaly.  There is no JVD.  The lungs are clear.   The heart exam reveals a regular rate with a normal S1 and S2.  There are no murmurs, gallops, or rubs.  The PMI is not displaced.  Abdominal exam reveals good bowel sounds.  There is no guarding or rebound.  There is no hepatosplenomegaly or tenderness.  There are no masses.  Exam of the legs reveal no clubbing, cyanosis, or edema.  The legs are without rashes.  The distal pulses are intact.  Cranial nerves II - XII are intact.  Motor and sensory functions are intact.  She uses a cane to walk.  LABORATORY DATA: ECG today demonstrates sinus bradycardia with a rate of 53 beats per minute. She has a right bundle branch block.  Assessment / Plan: 1. Hypertension. Blood pressure is mildly elevated today. In view of current guidelines I would not change her medications at this point. I would have her monitor blood pressure closely. If it is staying over 145 mm Hg systolic then we could increase her irbesartan.  2. Chronic right bundle branch block.

## 2012-12-09 NOTE — Patient Instructions (Signed)
Continue your current therapy  Keep an eye on your blood pressure. If it stays consistently over 145 let me know  I will see you in 6 months.

## 2012-12-30 ENCOUNTER — Telehealth: Payer: Self-pay | Admitting: Cardiology

## 2012-12-30 DIAGNOSIS — E785 Hyperlipidemia, unspecified: Secondary | ICD-10-CM | POA: Diagnosis not present

## 2012-12-30 DIAGNOSIS — E039 Hypothyroidism, unspecified: Secondary | ICD-10-CM | POA: Diagnosis not present

## 2012-12-30 DIAGNOSIS — I10 Essential (primary) hypertension: Secondary | ICD-10-CM | POA: Diagnosis not present

## 2012-12-30 DIAGNOSIS — R82998 Other abnormal findings in urine: Secondary | ICD-10-CM | POA: Diagnosis not present

## 2012-12-30 NOTE — Telephone Encounter (Signed)
New Problem:    Patient called in to let Dr. Sw[place]and know that her BP has been over 145 for the past two days.  Please call back.

## 2012-12-30 NOTE — Telephone Encounter (Signed)
I would increase her Avapro to 300 mg daily.  Yuliya Nova [place] MD, [facility]

## 2012-12-30 NOTE — Telephone Encounter (Signed)
Pt called to let Dr. [place]and know that her BP has been running higher the 145 systolic for the last 3 days. Today her BP was  159/66 prior taken her AM medications, 155/68 one hour after taken medication. Now two hours after is 153/58 pulse 49 beats per minute; pt denies any symptoms.

## 2012-12-30 NOTE — Telephone Encounter (Signed)
Advised pt to increase Avapro to 300mg  daily.

## 2013-01-25 ENCOUNTER — Other Ambulatory Visit: Payer: Self-pay

## 2013-01-25 MED ORDER — METOPROLOL TARTRATE 25 MG PO TABS: 25.0000 mg | ORAL_TABLET | Freq: Two times a day (BID) | ORAL | Status: DC

## 2013-02-13 DIAGNOSIS — R319 Hematuria, unspecified: Secondary | ICD-10-CM | POA: Diagnosis not present

## 2013-02-14 ENCOUNTER — Encounter: Payer: Self-pay | Admitting: Cardiology

## 2013-02-14 DIAGNOSIS — M5137 Other intervertebral disc degeneration, lumbosacral region: Secondary | ICD-10-CM | POA: Diagnosis not present

## 2013-02-14 DIAGNOSIS — S32009A Unspecified fracture of unspecified lumbar vertebra, initial encounter for closed fracture: Secondary | ICD-10-CM | POA: Diagnosis not present

## 2013-02-14 DIAGNOSIS — M8448XA Pathological fracture, other site, initial encounter for fracture: Secondary | ICD-10-CM | POA: Diagnosis not present

## 2013-02-14 DIAGNOSIS — K5900 Constipation, unspecified: Secondary | ICD-10-CM | POA: Diagnosis not present

## 2013-02-14 DIAGNOSIS — K838 Other specified diseases of biliary tract: Secondary | ICD-10-CM | POA: Diagnosis not present

## 2013-02-14 DIAGNOSIS — I10 Essential (primary) hypertension: Secondary | ICD-10-CM | POA: Diagnosis not present

## 2013-02-20 DIAGNOSIS — M545 Low back pain: Secondary | ICD-10-CM | POA: Diagnosis not present

## 2013-02-20 DIAGNOSIS — S22009A Unspecified fracture of unspecified thoracic vertebra, initial encounter for closed fracture: Secondary | ICD-10-CM | POA: Diagnosis not present

## 2013-02-20 DIAGNOSIS — M546 Pain in thoracic spine: Secondary | ICD-10-CM | POA: Diagnosis not present

## 2013-02-21 ENCOUNTER — Other Ambulatory Visit: Payer: Self-pay | Admitting: Neurosurgery

## 2013-02-26 ENCOUNTER — Ambulatory Visit
Admission: RE | Admit: 2013-02-26 | Discharge: 2013-02-26 | Disposition: A | Discharge status: Home / Self Care | Payer: Medicare Other | Source: Ambulatory Visit | Attending: Neurosurgery | Admitting: Neurosurgery

## 2013-02-26 DIAGNOSIS — M8448XA Pathological fracture, other site, initial encounter for fracture: Secondary | ICD-10-CM | POA: Diagnosis not present

## 2013-02-26 DIAGNOSIS — IMO0002 Reserved for concepts with insufficient information to code with codable children: Secondary | ICD-10-CM

## 2013-02-27 ENCOUNTER — Other Ambulatory Visit: Payer: Self-pay | Admitting: Neurosurgery

## 2013-02-27 ENCOUNTER — Inpatient Hospital Stay (HOSPITAL_COMMUNITY)
Admission: EM | Admit: 2013-02-27 | Discharge: 2013-03-06 | DRG: 516 | Disposition: A | Discharge status: Home / Self Care | Payer: Medicare Other | Attending: Internal Medicine | Admitting: Internal Medicine

## 2013-02-27 ENCOUNTER — Inpatient Hospital Stay: Admit: 2013-02-27 | Payer: Self-pay | Admitting: Neurosurgery

## 2013-02-27 ENCOUNTER — Encounter (HOSPITAL_COMMUNITY): Payer: Self-pay | Admitting: *Deleted

## 2013-02-27 DIAGNOSIS — I4510 Unspecified right bundle-branch block: Secondary | ICD-10-CM | POA: Diagnosis not present

## 2013-02-27 DIAGNOSIS — M8448XA Pathological fracture, other site, initial encounter for fracture: Principal | ICD-10-CM | POA: Diagnosis present

## 2013-02-27 DIAGNOSIS — E669 Obesity, unspecified: Secondary | ICD-10-CM | POA: Diagnosis present

## 2013-02-27 DIAGNOSIS — G8929 Other chronic pain: Secondary | ICD-10-CM | POA: Diagnosis present

## 2013-02-27 DIAGNOSIS — M810 Age-related osteoporosis without current pathological fracture: Secondary | ICD-10-CM | POA: Diagnosis present

## 2013-02-27 DIAGNOSIS — S32019A Unspecified fracture of first lumbar vertebra, initial encounter for closed fracture: Secondary | ICD-10-CM

## 2013-02-27 DIAGNOSIS — Z96629 Presence of unspecified artificial elbow joint: Secondary | ICD-10-CM

## 2013-02-27 DIAGNOSIS — N390 Urinary tract infection, site not specified: Secondary | ICD-10-CM | POA: Diagnosis not present

## 2013-02-27 DIAGNOSIS — S32009A Unspecified fracture of unspecified lumbar vertebra, initial encounter for closed fracture: Secondary | ICD-10-CM | POA: Diagnosis not present

## 2013-02-27 DIAGNOSIS — C569 Malignant neoplasm of unspecified ovary: Secondary | ICD-10-CM | POA: Diagnosis not present

## 2013-02-27 DIAGNOSIS — I10 Essential (primary) hypertension: Secondary | ICD-10-CM | POA: Diagnosis not present

## 2013-02-27 DIAGNOSIS — S22009A Unspecified fracture of unspecified thoracic vertebra, initial encounter for closed fracture: Secondary | ICD-10-CM | POA: Diagnosis not present

## 2013-02-27 DIAGNOSIS — S32019D Unspecified fracture of first lumbar vertebra, subsequent encounter for fracture with routine healing: Secondary | ICD-10-CM

## 2013-02-27 DIAGNOSIS — Z7982 Long term (current) use of aspirin: Secondary | ICD-10-CM

## 2013-02-27 DIAGNOSIS — R001 Bradycardia, unspecified: Secondary | ICD-10-CM

## 2013-02-27 DIAGNOSIS — K449 Diaphragmatic hernia without obstruction or gangrene: Secondary | ICD-10-CM | POA: Diagnosis present

## 2013-02-27 DIAGNOSIS — R4182 Altered mental status, unspecified: Secondary | ICD-10-CM | POA: Diagnosis not present

## 2013-02-27 DIAGNOSIS — M545 Low back pain, unspecified: Secondary | ICD-10-CM | POA: Diagnosis not present

## 2013-02-27 DIAGNOSIS — H3530 Unspecified macular degeneration: Secondary | ICD-10-CM | POA: Diagnosis present

## 2013-02-27 DIAGNOSIS — E785 Hyperlipidemia, unspecified: Secondary | ICD-10-CM | POA: Diagnosis present

## 2013-02-27 DIAGNOSIS — E039 Hypothyroidism, unspecified: Secondary | ICD-10-CM | POA: Diagnosis present

## 2013-02-27 DIAGNOSIS — T4275XA Adverse effect of unspecified antiepileptic and sedative-hypnotic drugs, initial encounter: Secondary | ICD-10-CM | POA: Diagnosis not present

## 2013-02-27 DIAGNOSIS — E876 Hypokalemia: Secondary | ICD-10-CM | POA: Diagnosis not present

## 2013-02-27 DIAGNOSIS — R52 Pain, unspecified: Secondary | ICD-10-CM | POA: Diagnosis not present

## 2013-02-27 DIAGNOSIS — Z87311 Personal history of (healed) other pathological fracture: Secondary | ICD-10-CM

## 2013-02-27 DIAGNOSIS — Y9210 Unspecified residential institution as the place of occurrence of the external cause: Secondary | ICD-10-CM | POA: Diagnosis not present

## 2013-02-27 DIAGNOSIS — Z6836 Body mass index (BMI) 36.0-36.9, adult: Secondary | ICD-10-CM

## 2013-02-27 LAB — BASIC METABOLIC PANEL
BUN: 21 mg/dL (ref 6–23)
Calcium: 10.1 mg/dL (ref 8.4–10.5)
Creatinine, Ser: 0.85 mg/dL (ref 0.50–1.10)
GFR calc Af Amer: 74 mL/min — ABNORMAL LOW (ref >90)
GFR calc non Af Amer: 63 mL/min — ABNORMAL LOW (ref >90)
Potassium: 3.3 mEq/L — ABNORMAL LOW (ref 3.5–5.1)

## 2013-02-27 LAB — CBC WITH DIFFERENTIAL/PLATELET
Basophils Relative: 1 % (ref 0–1)
Eosinophils Absolute: 0.2 10*3/uL (ref 0.0–0.7)
Hemoglobin: 13.8 g/dL (ref 12.0–15.0)
MCH: 29.9 pg (ref 26.0–34.0)
MCHC: 34.2 g/dL (ref 30.0–36.0)
Monocytes Absolute: 1 10*3/uL (ref 0.1–1.0)
Monocytes Relative: 9 % (ref 3–12)
Neutrophils Relative %: 77 % (ref 43–77)

## 2013-02-27 MED ORDER — SODIUM CHLORIDE 0.9 % IV SOLN: 250.0000 mL | INTRAVENOUS | Status: DC | PRN

## 2013-02-27 MED ORDER — ONDANSETRON HCL 4 MG PO TABS: 4.0000 mg | ORAL_TABLET | Freq: Four times a day (QID) | ORAL | Status: DC | PRN

## 2013-02-27 MED ORDER — NIACIN ER (ANTIHYPERLIPIDEMIC) 500 MG PO TBCR
500.0000 mg | EXTENDED_RELEASE_TABLET | Freq: Every day | ORAL | Status: DC
  Administered 2013-02-27: 500 mg via ORAL
  Filled 2013-02-27: qty 1

## 2013-02-27 MED ORDER — CEFAZOLIN SODIUM-DEXTROSE 2-3 GM-% IV SOLR
2.0000 g | INTRAVENOUS | Status: AC
  Administered 2013-03-02: 2 g via INTRAVENOUS
  Filled 2013-02-27: qty 50

## 2013-02-27 MED ORDER — ONDANSETRON HCL 4 MG/2ML IJ SOLN: 4.0000 mg | Freq: Four times a day (QID) | INTRAMUSCULAR | Status: DC | PRN

## 2013-02-27 MED ORDER — METOPROLOL TARTRATE 25 MG PO TABS
25.0000 mg | ORAL_TABLET | Freq: Two times a day (BID) | ORAL | Status: DC
  Administered 2013-02-27 – 2013-03-06 (×14): 25 mg via ORAL
  Filled 2013-02-27 (×15): qty 1

## 2013-02-27 MED ORDER — HYDROCHLOROTHIAZIDE 25 MG PO TABS
25.0000 mg | ORAL_TABLET | Freq: Every day | ORAL | Status: DC
  Administered 2013-02-28 – 2013-03-06 (×6): 25 mg via ORAL
  Filled 2013-02-27 (×7): qty 1

## 2013-02-27 MED ORDER — SENNA 8.6 MG PO TABS
1.0000 | ORAL_TABLET | Freq: Every day | ORAL | Status: DC
  Administered 2013-02-27: 8.6 mg via ORAL
  Filled 2013-02-27 (×2): qty 1

## 2013-02-27 MED ORDER — IRBESARTAN 150 MG PO TABS
150.0000 mg | ORAL_TABLET | Freq: Every day | ORAL | Status: DC
  Administered 2013-02-28 – 2013-03-06 (×7): 150 mg via ORAL
  Filled 2013-02-27 (×7): qty 1

## 2013-02-27 MED ORDER — BISACODYL 10 MG RE SUPP
10.0000 mg | Freq: Once | RECTAL | Status: AC
  Administered 2013-02-27: 10 mg via RECTAL
  Filled 2013-02-27: qty 1

## 2013-02-27 MED ORDER — SODIUM CHLORIDE 0.9 % IJ SOLN
3.0000 mL | Freq: Two times a day (BID) | INTRAMUSCULAR | Status: DC
  Administered 2013-02-28 – 2013-03-05 (×6): 3 mL via INTRAVENOUS

## 2013-02-27 MED ORDER — NIACIN ER 500 MG PO CPCR
500.0000 mg | ORAL_CAPSULE | Freq: Every day | ORAL | Status: DC
  Administered 2013-02-28 – 2013-03-04 (×5): 500 mg via ORAL
  Filled 2013-02-27 (×7): qty 1

## 2013-02-27 MED ORDER — HYDROMORPHONE HCL PF 1 MG/ML IJ SOLN
1.0000 mg | Freq: Once | INTRAMUSCULAR | Status: AC
  Administered 2013-02-27: 1 mg via INTRAVENOUS
  Filled 2013-02-27: qty 1

## 2013-02-27 MED ORDER — OXYCODONE HCL 5 MG PO TABS
5.0000 mg | ORAL_TABLET | ORAL | Status: DC | PRN
  Administered 2013-02-27 – 2013-03-03 (×12): 5 mg via ORAL
  Filled 2013-02-27 (×13): qty 1

## 2013-02-27 MED ORDER — AMLODIPINE BESYLATE 5 MG PO TABS
5.0000 mg | ORAL_TABLET | Freq: Every day | ORAL | Status: DC
  Administered 2013-02-28 – 2013-03-06 (×5): 5 mg via ORAL
  Filled 2013-02-27 (×7): qty 1

## 2013-02-27 MED ORDER — AMOXICILLIN 500 MG PO CAPS
500.0000 mg | ORAL_CAPSULE | Freq: Four times a day (QID) | ORAL | Status: DC
  Administered 2013-02-27 – 2013-02-28 (×2): 500 mg via ORAL
  Filled 2013-02-27 (×6): qty 1

## 2013-02-27 MED ORDER — LEVOTHYROXINE SODIUM 200 MCG PO TABS
200.0000 ug | ORAL_TABLET | Freq: Every day | ORAL | Status: DC
  Administered 2013-02-28 – 2013-03-06 (×7): 200 ug via ORAL
  Filled 2013-02-27 (×8): qty 1

## 2013-02-27 MED ORDER — HYDROMORPHONE HCL PF 1 MG/ML IJ SOLN
1.0000 mg | INTRAMUSCULAR | Status: DC | PRN
  Administered 2013-02-28 – 2013-03-02 (×2): 1 mg via INTRAVENOUS
  Filled 2013-02-27 (×2): qty 1

## 2013-02-27 MED ORDER — SODIUM CHLORIDE 0.9 % IJ SOLN
3.0000 mL | INTRAMUSCULAR | Status: DC | PRN
  Administered 2013-03-04: 3 mL via INTRAVENOUS

## 2013-02-27 NOTE — ED Notes (Signed)
Ramond Dial family friend to be called if needed [phone]

## 2013-02-27 NOTE — H&P (Signed)
PCP:   Hoyle Sauer, MD   Chief Complaint:  Back pain  HPI: 79 yo female with lower back pain that has worsened over the last 3 weeks sent here from Dr. Stones office (neurosurgery) for pain control.  Pt slipped off her bed in October 2013 and did not have any significant pain until about 3-4 weeks ago.  She denies any trauma to her back since she fell off the bed onto her buttocks.  She denies any urinary incont/bowel incont or saddle anesthesia.  She has been able to ambulate but her pain has been poorly controlled with hydrocodone.  No fevers.  Tolerating po.  No n/v/d.  No cp,sob, abd pain.    Review of Systems:  Positive and negative as per HPI otherwise all other systems are negative  Past Medical History: Past Medical History  Diagnosis Date  . Ovarian cancer     Stage IA grade 2 ovarian cancer  . Obesity   . Hypertension   . Macular degeneration   . Hyperlipidemia   . Hypothyroidism   . Hiatal hernia    Past Surgical History  Procedure Laterality Date  . Elbow arthroplasty      replacing the humeral stem  . Cholecystectomy, laparoscopic    . Elbow replacement    . Colectomy       Rectosigmoid colectomy with reanastomosis  . Other surgical history    . Other surgical history      Ovarian cancer resection and staging    Medications: Prior to Admission medications   Medication Sig Start Date End Date Taking? Authorizing Provider  Alendronate Sodium (FOSAMAX PO) Take 70 mg by mouth once a week.    Yes Historical Provider, MD  amLODipine (NORVASC) 5 MG tablet Take 1 tablet (5 mg total) by mouth daily. 09/02/12  Yes Peter M [place], MD  amoxicillin (AMOXIL) 500 MG capsule Take 500 mg by mouth every 6 (six) hours.   Yes Historical Provider, MD  aspirin 81 MG tablet Take 81 mg by mouth daily.   Yes Historical Provider, MD  beta carotene w/minerals (OCUVITE) tablet Take 1 tablet by mouth daily.   Yes Historical Provider, MD  bisacodyl (DULCOLAX) 10 MG suppository  Place 10 mg rectally daily as needed for constipation.   Yes Historical Provider, MD  hydrochlorothiazide (HYDRODIURIL) 25 MG tablet Take 1 tablet (25 mg total) by mouth daily. 12/09/12  Yes Peter M [place], MD  Hydrocodone-Acetaminophen 5-300 MG TABS Take 2 tablets by mouth See admin instructions. Take 2 tablets every five hours   Yes Historical Provider, MD  irbesartan (AVAPRO) 150 MG tablet Take 1 tablet (150 mg total) by mouth at bedtime. 12/09/12  Yes Peter M [place], MD  levothyroxine (SYNTHROID, LEVOTHROID) 200 MCG tablet Take 200 mcg by mouth daily.  08/28/11  Yes Historical Provider, MD  metoprolol tartrate (LOPRESSOR) 25 MG tablet Take 1 tablet (25 mg total) by mouth 2 (two) times daily. 01/25/13  Yes Peter M [place], MD  niacin (NIASPAN) 500 MG CR tablet Take 500 mg by mouth at bedtime.    Yes Historical Provider, MD  rosuvastatin (CRESTOR) 20 MG tablet Take 20 mg by mouth daily.   Yes Historical Provider, MD  senna (SENOKOT) 8.6 MG tablet Take 1 tablet by mouth daily as needed for constipation.   Yes Historical Provider, MD    Allergies:  No Known Allergies  Social History: Neg x 3 lives at home with husband  Family History: Family History  Problem Relation Age  of Onset  . Heart attack Father     Physical Exam: Filed Vitals:   02/27/13 1734  BP: 129/74  Pulse: 68  Temp: 98.3 F (36.8 C)  TempSrc: Oral  Resp: 16  SpO2: 96%   General appearance: alert, cooperative and no distress Head: Normocephalic, without obvious abnormality, atraumatic Eyes: negative Nose: Nares normal. Septum midline. Mucosa normal. No drainage or sinus tenderness. Neck: no JVD and supple, symmetrical, trachea midline Lungs: clear to auscultation bilaterally Heart: regular rate and rhythm, S1, S2 normal, no murmur, click, rub or gallop Abdomen: soft, non-tender; bowel sounds normal; no masses,  no organomegaly Extremities: extremities normal, atraumatic, no cyanosis or edema Pulses: 2+ and  symmetric Skin: Skin color, texture, turgor normal. No rashes or lesions Neurologic: Grossly normal    Labs on Admission:   Recent Labs  02/27/13 1832  NA 137  K 3.3*  CL 98  CO2 24  GLUCOSE 105*  BUN 21  CREATININE 0.85  CALCIUM 10.1    Recent Labs  02/27/13 1832  WBC 10.9*  NEUTROABS 8.4*  HGB 13.8  HCT 40.4  MCV 87.4  PLT 332    Radiological Exams on Admission: Mr Thoracic Spine Wo Contrast  02/26/2013  *RADIOLOGY REPORT*  Clinical Data:  Compression fracture.  Back pain 2 weeks  MRI THORACIC AND LUMBAR SPINE WITHOUT CONTRAST  Technique:  Multiplanar and multiecho pulse sequences of the thoracic and lumbar spine were obtained without intravenous contrast.  Comparison:  None  MRI THORACIC SPINE  Findings: Mild chronic compression fracture T3 and T11 without bone marrow edema.  Severe burst fracture of L1 which is described in a separate lumbar MRI report.  No mass lesion in the thoracic spine. There is no cord compression.  Spinal cord signal is normal.  Disc degeneration and spurring at T10-11 related to the chronic fracture of T11.  There is mild posterior element hypertrophy and mild spinal stenosis at this level.  No acute disc protrusion or cord deformity in the thoracic spine.  IMPRESSION:  Chronic compression fractures T3 and T11.  No acute fracture or mass in the thoracic spine.  Burst fracture L1, see below report  MRI LUMBAR SPINE  Findings: Burst fracture L1 with comminuted vertebral body and retropulsion of bone into the spinal canal.  No compression of the conus medullaris.  There is mild spinal stenosis at this level. There is diffuse bone marrow edema in the vertebral body consistent with an acute fracture.  This is most likely a benign fracture.  Chronic fracture T11.  No other lumbar fracture.  L1-2:  Mild disc degeneration  L2-3:  Negative  L3-4:  Mild disc and facet degeneration without stenosis  L4-5:  3 mm anterior slip.  Disc and facet degeneration with  moderate spinal stenosis.  There is advanced facet hypertrophy bilaterally causing mild foraminal narrowing.  L5-S1:  Mild disc and facet degeneration.  Mild anterior slip.  IMPRESSION:  Acute burst fracture of L1 with retropulsion of bone into the spinal canal and mild spinal stenosis.  No compression of the conus medullaris.  This appears to be an acute fracture.  Moderate spinal stenosis L4-5.   Original Report Authenticated By: Janeece Riggers, M.D.    Mr Lumbar Spine Wo Contrast  02/26/2013  *RADIOLOGY REPORT*  Clinical Data:  Compression fracture.  Back pain 2 weeks  MRI THORACIC AND LUMBAR SPINE WITHOUT CONTRAST  Technique:  Multiplanar and multiecho pulse sequences of the thoracic and lumbar spine were obtained without intravenous  contrast.  Comparison:  None  MRI THORACIC SPINE  Findings: Mild chronic compression fracture T3 and T11 without bone marrow edema.  Severe burst fracture of L1 which is described in a separate lumbar MRI report.  No mass lesion in the thoracic spine. There is no cord compression.  Spinal cord signal is normal.  Disc degeneration and spurring at T10-11 related to the chronic fracture of T11.  There is mild posterior element hypertrophy and mild spinal stenosis at this level.  No acute disc protrusion or cord deformity in the thoracic spine.  IMPRESSION:  Chronic compression fractures T3 and T11.  No acute fracture or mass in the thoracic spine.  Burst fracture L1, see below report  MRI LUMBAR SPINE  Findings: Burst fracture L1 with comminuted vertebral body and retropulsion of bone into the spinal canal.  No compression of the conus medullaris.  There is mild spinal stenosis at this level. There is diffuse bone marrow edema in the vertebral body consistent with an acute fracture.  This is most likely a benign fracture.  Chronic fracture T11.  No other lumbar fracture.  L1-2:  Mild disc degeneration  L2-3:  Negative  L3-4:  Mild disc and facet degeneration without stenosis  L4-5:  3  mm anterior slip.  Disc and facet degeneration with moderate spinal stenosis.  There is advanced facet hypertrophy bilaterally causing mild foraminal narrowing.  L5-S1:  Mild disc and facet degeneration.  Mild anterior slip.  IMPRESSION:  Acute burst fracture of L1 with retropulsion of bone into the spinal canal and mild spinal stenosis.  No compression of the conus medullaris.  This appears to be an acute fracture.  Moderate spinal stenosis L4-5.   Original Report Authenticated By: Janeece Riggers, M.D.     Assessment/Plan 79 yo female with L1 burst fracture with retropulsion of bone into spinal canal and uncontrolled pain  Principal Problem:   Fracture of L1 vertebra Active Problems:   HTN (hypertension)   RBBB   Chronic back pain greater than 3 months duration  Place on oxycodone.  Iv dilaudid prn severe pain.  Neurosurgery aware and will see in am, kyphoplasty cannot be done until Thursday per report from EDP.  Med bed.  Full code.  DAVID,RACHAL A 02/27/2013, 7:52 PM

## 2013-02-27 NOTE — ED Notes (Signed)
Per RES okay, pt given food, pt updated on poc for admission

## 2013-02-27 NOTE — ED Provider Notes (Signed)
History     CSN: [id number]  Arrival date & time 02/27/13  1728   First MD Initiated Contact with Patient 02/27/13 1731      Chief Complaint  Patient presents with  . Back Pain    Patient is a 79 y.o. female presenting with back pain.  Back Pain Associated symptoms: no abdominal pain, no chest pain, no dysuria, no fever and no headaches    [name] is a 79 y.o. female who presents to the emergency department from spine surgery clinic for admission.  Patient reportedly had a ground level fall in October.  At that point in time she fractured her L1 spine.  Has been trying to treat medically since then with little success.  Seen by spinal surgery and had MRI done yesterday that showed comminuted L1 fracture.  Went and saw Dr. Venetia Maxon today who wanted patient admitted until Thursday when he can do kyphoplasty.    Past Medical History  Diagnosis Date  . Ovarian cancer     Stage IA grade 2 ovarian cancer  . Obesity   . Hypertension   . Macular degeneration   . Hyperlipidemia   . Hypothyroidism   . Hiatal hernia     Past Surgical History  Procedure Laterality Date  . Elbow arthroplasty      replacing the humeral stem  . Cholecystectomy, laparoscopic    . Elbow replacement    . Colectomy       Rectosigmoid colectomy with reanastomosis  . Other surgical history    . Other surgical history      Ovarian cancer resection and staging    Family History  Problem Relation Age of Onset  . Heart attack Father     History  Substance Use Topics  . Smoking status: Never Smoker   . Smokeless tobacco: Not on file  . Alcohol Use: No    OB History   Grav Para Term Preterm Abortions TAB SAB Ect Mult Living                  Review of Systems  Constitutional: Negative for fever and chills.  HENT: Negative for congestion, rhinorrhea, neck pain and neck stiffness.   Respiratory: Negative for cough and shortness of breath.   Cardiovascular: Negative for chest pain.   Gastrointestinal: Negative for nausea, vomiting, abdominal pain, diarrhea and abdominal distention.  Endocrine: Negative for polyuria.  Genitourinary: Negative for dysuria.  Musculoskeletal: Positive for back pain.  Skin: Negative for rash.  Neurological: Negative for headaches.  Psychiatric/Behavioral: Negative.   All other systems reviewed and are negative.    Allergies  Review of patient's allergies indicates no known allergies.  Home Medications   Current Outpatient Rx  Name  Route  Sig  Dispense  Refill  . Alendronate Sodium (FOSAMAX PO)   Oral   Take 70 mg by mouth once a week.          Marland Kitchen amLODipine (NORVASC) 5 MG tablet   Oral   Take 1 tablet (5 mg total) by mouth daily.   30 tablet   5   . amoxicillin (AMOXIL) 500 MG capsule   Oral   Take 500 mg by mouth every 6 (six) hours.         Marland Kitchen aspirin 81 MG tablet   Oral   Take 81 mg by mouth daily.         . beta carotene w/minerals (OCUVITE) tablet   Oral   Take 1 tablet  by mouth daily.         . bisacodyl (DULCOLAX) 10 MG suppository   Rectal   Place 10 mg rectally daily as needed for constipation.         . hydrochlorothiazide (HYDRODIURIL) 25 MG tablet   Oral   Take 1 tablet (25 mg total) by mouth daily.   90 tablet   3   . Hydrocodone-Acetaminophen 5-300 MG TABS   Oral   Take 2 tablets by mouth See admin instructions. Take 2 tablets every five hours         . irbesartan (AVAPRO) 150 MG tablet   Oral   Take 1 tablet (150 mg total) by mouth at bedtime.         Marland Kitchen levothyroxine (SYNTHROID, LEVOTHROID) 200 MCG tablet   Oral   Take 200 mcg by mouth daily.          . metoprolol tartrate (LOPRESSOR) 25 MG tablet   Oral   Take 1 tablet (25 mg total) by mouth 2 (two) times daily.   60 tablet   5   . niacin (NIASPAN) 500 MG CR tablet   Oral   Take 500 mg by mouth at bedtime.          . rosuvastatin (CRESTOR) 20 MG tablet   Oral   Take 20 mg by mouth daily.         Marland Kitchen senna  (SENOKOT) 8.6 MG tablet   Oral   Take 1 tablet by mouth daily as needed for constipation.           BP 129/74  Pulse 68  Temp(Src) 98.3 F (36.8 C) (Oral)  Resp 16  SpO2 96%  Physical Exam  Nursing note and vitals reviewed. Constitutional: She is oriented to person, place, and time. She appears well-developed and well-nourished. No distress.  HENT:  Head: Normocephalic and atraumatic.  Right Ear: External ear normal.  Left Ear: External ear normal.  Nose: Nose normal.  Mouth/Throat: Oropharynx is clear and moist. No oropharyngeal exudate.  Eyes: EOM are normal. Pupils are equal, round, and reactive to light.  Neck: Normal range of motion. Neck supple. No tracheal deviation present.  Cardiovascular: Normal rate.   Pulmonary/Chest: Effort normal and breath sounds normal. No stridor. No respiratory distress. She has no wheezes. She has no rales.  Abdominal: Soft. She exhibits no distension. There is no tenderness. There is no rebound.  Musculoskeletal: Normal range of motion.       Cervical back: She exhibits no bony tenderness.       Thoracic back: She exhibits no bony tenderness.       Lumbar back: She exhibits no bony tenderness.  Neurological: She is alert and oriented to person, place, and time.  Skin: Skin is warm and dry. She is not diaphoretic.    ED Course  Procedures (including critical care time)  Labs Reviewed  CBC WITH DIFFERENTIAL - Abnormal; Notable for the following:    WBC 10.9 (*)    Neutro Abs 8.4 (*)    Lymphocytes Relative 10 (*)    Basophils Absolute 0.2 (*)    All other components within normal limits  BASIC METABOLIC PANEL - Abnormal; Notable for the following:    Potassium 3.3 (*)    Glucose, Bld 105 (*)    GFR calc non Af Amer 63 (*)    GFR calc Af Amer 74 (*)    All other components within normal limits   Mr Thoracic Spine  Wo Contrast  02/26/2013  *RADIOLOGY REPORT*  Clinical Data:  Compression fracture.  Back pain 2 weeks  MRI THORACIC  AND LUMBAR SPINE WITHOUT CONTRAST  Technique:  Multiplanar and multiecho pulse sequences of the thoracic and lumbar spine were obtained without intravenous contrast.  Comparison:  None  MRI THORACIC SPINE  Findings: Mild chronic compression fracture T3 and T11 without bone marrow edema.  Severe burst fracture of L1 which is described in a separate lumbar MRI report.  No mass lesion in the thoracic spine. There is no cord compression.  Spinal cord signal is normal.  Disc degeneration and spurring at T10-11 related to the chronic fracture of T11.  There is mild posterior element hypertrophy and mild spinal stenosis at this level.  No acute disc protrusion or cord deformity in the thoracic spine.  IMPRESSION:  Chronic compression fractures T3 and T11.  No acute fracture or mass in the thoracic spine.  Burst fracture L1, see below report  MRI LUMBAR SPINE  Findings: Burst fracture L1 with comminuted vertebral body and retropulsion of bone into the spinal canal.  No compression of the conus medullaris.  There is mild spinal stenosis at this level. There is diffuse bone marrow edema in the vertebral body consistent with an acute fracture.  This is most likely a benign fracture.  Chronic fracture T11.  No other lumbar fracture.  L1-2:  Mild disc degeneration  L2-3:  Negative  L3-4:  Mild disc and facet degeneration without stenosis  L4-5:  3 mm anterior slip.  Disc and facet degeneration with moderate spinal stenosis.  There is advanced facet hypertrophy bilaterally causing mild foraminal narrowing.  L5-S1:  Mild disc and facet degeneration.  Mild anterior slip.  IMPRESSION:  Acute burst fracture of L1 with retropulsion of bone into the spinal canal and mild spinal stenosis.  No compression of the conus medullaris.  This appears to be an acute fracture.  Moderate spinal stenosis L4-5.   Original Report Authenticated By: Janeece Riggers, M.D.    Mr Lumbar Spine Wo Contrast  02/26/2013  *RADIOLOGY REPORT*  Clinical Data:   Compression fracture.  Back pain 2 weeks  MRI THORACIC AND LUMBAR SPINE WITHOUT CONTRAST  Technique:  Multiplanar and multiecho pulse sequences of the thoracic and lumbar spine were obtained without intravenous contrast.  Comparison:  None  MRI THORACIC SPINE  Findings: Mild chronic compression fracture T3 and T11 without bone marrow edema.  Severe burst fracture of L1 which is described in a separate lumbar MRI report.  No mass lesion in the thoracic spine. There is no cord compression.  Spinal cord signal is normal.  Disc degeneration and spurring at T10-11 related to the chronic fracture of T11.  There is mild posterior element hypertrophy and mild spinal stenosis at this level.  No acute disc protrusion or cord deformity in the thoracic spine.  IMPRESSION:  Chronic compression fractures T3 and T11.  No acute fracture or mass in the thoracic spine.  Burst fracture L1, see below report  MRI LUMBAR SPINE  Findings: Burst fracture L1 with comminuted vertebral body and retropulsion of bone into the spinal canal.  No compression of the conus medullaris.  There is mild spinal stenosis at this level. There is diffuse bone marrow edema in the vertebral body consistent with an acute fracture.  This is most likely a benign fracture.  Chronic fracture T11.  No other lumbar fracture.  L1-2:  Mild disc degeneration  L2-3:  Negative  L3-4:  Mild  disc and facet degeneration without stenosis  L4-5:  3 mm anterior slip.  Disc and facet degeneration with moderate spinal stenosis.  There is advanced facet hypertrophy bilaterally causing mild foraminal narrowing.  L5-S1:  Mild disc and facet degeneration.  Mild anterior slip.  IMPRESSION:  Acute burst fracture of L1 with retropulsion of bone into the spinal canal and mild spinal stenosis.  No compression of the conus medullaris.  This appears to be an acute fracture.  Moderate spinal stenosis L4-5.   Original Report Authenticated By: Janeece Riggers, M.D.      1. L1 vertebral  fracture, closed, initial encounter   2. Chronic back pain greater than 3 months duration   3. HTN (hypertension)   4. RBBB       MDM   79 year old female who was sent over for pain control admission to hospitalists by Dr. Venetia Maxon.  Patient in no distress.  Basic labs checked.  Pain controlled with IV dilaudid.  Hospitalists consulted.  Patient admitted for IV pain control and eventual kyphoplasty.  No other acute concerns while in ED.       Arloa Koh, MD 02/28/13 [phone]

## 2013-02-27 NOTE — ED Notes (Signed)
Per EMS- pt was sent from [facility]. Venetia Maxon wanted to be a direct admit but unit was full. Pt was sent EMS to go through ED. Pt has L1 fracture. Pt had a fall 2-3 weeks ago. Lower back pain and difficulty moving. Pt took her own hydrocodone at the dr office earlier.

## 2013-02-28 MED ORDER — DOCUSATE SODIUM 100 MG PO CAPS
100.0000 mg | ORAL_CAPSULE | Freq: Two times a day (BID) | ORAL | Status: DC
  Administered 2013-02-28 – 2013-03-02 (×5): 100 mg via ORAL
  Filled 2013-02-28 (×5): qty 1

## 2013-02-28 MED ORDER — SODIUM CHLORIDE 0.9 % IV SOLN
250.0000 mL | INTRAVENOUS | Status: DC
  Administered 2013-02-28: 250 mL via INTRAVENOUS

## 2013-02-28 MED ORDER — POTASSIUM CHLORIDE CRYS ER 20 MEQ PO TBCR
40.0000 meq | EXTENDED_RELEASE_TABLET | Freq: Once | ORAL | Status: AC
  Administered 2013-02-28: 40 meq via ORAL
  Filled 2013-02-28: qty 2

## 2013-02-28 MED ORDER — SODIUM CHLORIDE 0.9 % IV SOLN: INTRAVENOUS | Status: AC

## 2013-02-28 MED ORDER — SENNA 8.6 MG PO TABS
1.0000 | ORAL_TABLET | Freq: Two times a day (BID) | ORAL | Status: DC
  Administered 2013-02-28 – 2013-03-06 (×12): 8.6 mg via ORAL
  Filled 2013-02-28 (×13): qty 1

## 2013-02-28 MED ORDER — POLYETHYLENE GLYCOL 3350 17 G PO PACK
17.0000 g | PACK | Freq: Every day | ORAL | Status: DC
  Administered 2013-02-28 – 2013-03-06 (×7): 17 g via ORAL
  Filled 2013-02-28 (×7): qty 1

## 2013-02-28 NOTE — Progress Notes (Signed)
[facility] PROGRESS NOTE  [name] VWU:[id number] DOB: [name] 01, 1931 DOA: 02/27/2013 PCP: Hoyle Sauer, MD  Assessment/Plan: L1 fracture of the vertebra Continue pain medications with oral oxycodone and IV dilaudid. Plan for kyphoplasty by Dr. Edythe Lynn on 03/02/2012. Start bowel regimen given patient is on pain medications.  Hypertension Stable. Continue home medication.  RBBB Chronic. Stable.  Chronic low back pain Pain control as indicated above.  Hypothyroidism Continue home medication.  Hyperlipidemia Continue home medication.  Amoxicillin use No clear indication. Discussed with Dr. Felipa Eth per PCP, who indicated that he has not prescribed this medication since 2011. Discontinue amoxicillin.  Hypokalemia Replace as needed.  Code Status: Full code. Family Communication: No family present.  Disposition Plan: Pending.   Consultants:  Neurosurgery, Dr. Venetia Maxon.  Procedures:  As above.  Antibiotics:  Amoxicillin 02/27/2013 >> 02/28/2013.  HPI/Subjective: Backing improved with lay on her right. No other concerns.  Objective: Filed Vitals:   02/27/13 1734 02/27/13 2117 02/27/13 2309 02/28/13 0528  BP: 129/74 133/53 128/54 121/47  Pulse: 68 73 64 58  Temp: 98.3 F (36.8 C)   98.2 F (36.8 C)  TempSrc: Oral   Oral  Resp: 16   16  SpO2: 96% 97%  96%    Intake/Output Summary (Last 24 hours) at 02/28/13 0915 Last data filed at 02/28/13 0500  Gross per 24 hour  Intake    200 ml  Output      0 ml  Net    200 ml   There were no vitals filed for this visit.  Exam: Physical Exam: General: Awake, Oriented, No acute distress. HEENT: EOMI. Neck: Supple CV: S1 and S2 Lungs: Clear to ascultation bilaterally Abdomen: Soft, Nontender, Nondistended, +bowel sounds. Ext: Good pulses. Trace edema. Back: Low back pain.  Data Reviewed: Basic Metabolic Panel:  Recent Labs Lab 02/27/13 1832  NA 137  K 3.3*  CL 98  CO2 24  GLUCOSE 105*  BUN 21   CREATININE 0.85  CALCIUM 10.1   Liver Function Tests: No results found for this basename: AST, ALT, ALKPHOS, BILITOT, PROT, ALBUMIN,  in the last 168 hours No results found for this basename: LIPASE, AMYLASE,  in the last 168 hours No results found for this basename: AMMONIA,  in the last 168 hours CBC:  Recent Labs Lab 02/27/13 1832  WBC 10.9*  NEUTROABS 8.4*  HGB 13.8  HCT 40.4  MCV 87.4  PLT 332   Cardiac Enzymes: No results found for this basename: CKTOTAL, CKMB, CKMBINDEX, TROPONINI,  in the last 168 hours BNP (last 3 results) No results found for this basename: PROBNP,  in the last 8760 hours CBG: No results found for this basename: GLUCAP,  in the last 168 hours  No results found for this or any previous visit (from the past 240 hour(s)).   Studies: No results found.  Scheduled Meds: . amLODipine  5 mg Oral Daily  . amoxicillin  500 mg Oral Q6H  . [START ON 03/02/2013]  ceFAZolin (ANCEF) IV  2 g Intravenous On Call to OR  . hydrochlorothiazide  25 mg Oral Daily  . irbesartan  150 mg Oral Daily  . levothyroxine  200 mcg Oral QAC breakfast  . metoprolol tartrate  25 mg Oral BID  . niacin  500 mg Oral QHS  . potassium chloride  40 mEq Oral Once  . senna  1 tablet Oral QHS  . sodium chloride  3 mL Intravenous Q12H   Continuous Infusions:   Principal Problem:  Fracture of L1 vertebra Active Problems:   HTN (hypertension)   RBBB   Chronic back pain greater than 3 months duration    REDDY,SRIKAR A, MD  [facility] Pager [phone]. If 7PM-7AM, please contact night-coverage at [email or web address], password [facility] 02/28/2013, 9:15 AM  LOS: 1 day

## 2013-02-28 NOTE — Progress Notes (Signed)
Improved pain control.  Awaiting kyphoplasty, currently scheduled for Thursday.

## 2013-02-28 NOTE — Progress Notes (Signed)
Subjective: Patient reports "I had the best night last night that I've had in weeks. I almost feel like I could go home."  Objective: Vital signs in last 24 hours: Temp:  [98.2 F (36.8 C)-98.3 F (36.8 C)] 98.2 F (36.8 C) (03/11 0528) Pulse Rate:  [58-73] 58 (03/11 0528) Resp:  [16] 16 (03/11 0528) BP: (121-133)/(47-74) 121/47 mmHg (03/11 0528) SpO2:  [96 %-97 %] 96 % (03/11 0528)  Intake/Output from previous day: 03/10 0701 - 03/11 0700 In: 200 [P.O.:200] Out: -  Intake/Output this shift:    Alert, conversant. Reports back pain well-controlled. Voices concern about poor voiding. Good BM this am, and no pressure or urgency reported -  only concern that she doesn't feel she has voided enough urine last 24hrs. Bladder scan indicates empty bladder.   Lab Results:  Recent Labs  02/27/13 1832  WBC 10.9*  HGB 13.8  HCT 40.4  PLT 332   BMET  Recent Labs  02/27/13 1832  NA 137  K 3.3*  CL 98  CO2 24  GLUCOSE 105*  BUN 21  CREATININE 0.85  CALCIUM 10.1    Studies/Results: No results found.  Assessment/Plan: Improved pain level   LOS: 1 day  Reassured pt re: voiding. Advised pt she could drink more liquids, but no need for concern. Kyphoplasty planned for Thursday afternoon.   Georgiann Cocker 02/28/2013, 8:42 AM

## 2013-02-28 NOTE — Evaluation (Signed)
Physical Therapy Evaluation Patient Details Name: [name] MRN: [id number] DOB: April 18, 1931 Today's Date: 02/28/2013 Time: 1478-2956 PT Time Calculation (min): 59 min  PT Assessment / Plan / Recommendation Clinical Impression  Pt. is a 79 y/o female with L1 burst fx.  Pt. currently awaiting decision from neurosurgery for treatment management.  At this time feel pt. could safely d/c home, however will reassess as treatment plans are finalized by neurosurgeon.    PT Assessment  Patient needs continued PT services    Follow Up Recommendations  Home health PT;Supervision/Assistance - 24 hour    Does the patient have the potential to tolerate intense rehabilitation      Barriers to Discharge None      Equipment Recommendations  None recommended by PT    Recommendations for Other Services     Frequency Min 3X/week    Precautions / Restrictions Precautions Precautions: Fall Restrictions Weight Bearing Restrictions: No   Pertinent Vitals/Pain 8/10 back, RN notified      Mobility  Bed Mobility Bed Mobility: Rolling Right;Right Sidelying to Sit;Sit to Supine;Sitting - Scoot to Edge of Bed Rolling Right: 4: Min assist;With rail Right Sidelying to Sit: 3: Mod assist;With rails;HOB elevated Sitting - Scoot to Edge of Bed: 4: Min guard;With rail Sit to Supine: 3: Mod assist;HOB flat Details for Bed Mobility Assistance: mod cues for pain management with back Transfers Transfers: Sit to Stand;Stand to Sit;Stand Pivot Transfers Sit to Stand: 4: Min assist;From bed;From chair/3-in-1;With armrests Stand to Sit: 4: Min guard;To chair/3-in-1;With armrests Stand Pivot Transfers: 4: Min assist Details for Transfer Assistance: VCs for safe hand placement Ambulation/Gait Ambulation/Gait Assistance: 4: Min guard Ambulation Distance (Feet): 4 Feet Assistive device: Rolling walker Gait Pattern: Within Functional Limits Stairs: No Wheelchair Mobility Wheelchair Mobility: No     Exercises     PT Diagnosis: Difficulty walking;Acute pain  PT Problem List: Decreased activity tolerance;Decreased balance;Decreased mobility;Decreased knowledge of use of DME;Pain PT Treatment Interventions: DME instruction;Gait training;Functional mobility training;Therapeutic activities;Therapeutic exercise;Balance training;Neuromuscular re-education;Patient/family education;Stair training   PT Goals Acute Rehab PT Goals PT Goal Formulation: With patient/family Time For Goal Achievement: 03/07/13 Potential to Achieve Goals: Good Pt will Roll Supine to Right Side: with supervision PT Goal: Rolling Supine to Right Side - Progress: Goal set today Pt will Roll Supine to Left Side: with supervision PT Goal: Rolling Supine to Left Side - Progress: Goal set today Pt will go Supine/Side to Sit: with supervision;with HOB 0 degrees PT Goal: Supine/Side to Sit - Progress: Goal set today Pt will go Sit to Supine/Side: with supervision;with HOB 0 degrees PT Goal: Sit to Supine/Side - Progress: Goal set today Pt will go Sit to Stand: with supervision;with upper extremity assist PT Goal: Sit to Stand - Progress: Goal set today Pt will go Stand to Sit: with supervision;with upper extremity assist PT Goal: Stand to Sit - Progress: Goal set today Pt will Ambulate: 51 - 150 feet;with supervision;with rolling walker PT Goal: Ambulate - Progress: Goal set today Pt will Go Up / Down Stairs: 3-5 stairs;with supervision;with rail(s) PT Goal: Up/Down Stairs - Progress: Goal set today  Visit Information  Last PT Received On: 02/28/13 Assistance Needed: +1    Subjective Data  Subjective: "am I supposed to be this miserable" Patient Stated Goal: to go home   Prior Functioning  Home Living Lives With: Spouse Available Help at Discharge: Family;Available 24 hours/day;Other (Comment) (daughter available as needed) Type of Home: House Home Access: Stairs to enter [employer] of  Steps:  5 Entrance Stairs-Rails: Left;Right Home Layout: One level;Full bath on main level;Able to live on main level with bedroom/bathroom Bathroom Shower/Tub: Tub/shower unit;Curtain Bathroom Toilet: Standard Bathroom Accessibility: Yes How Accessible: Accessible via walker Home Adaptive Equipment: Bedside commode/3-in-1;Walker - rolling;Straight cane;Grab bars in shower;Grab bars around toilet;Hand-held shower hose Prior Function Level of Independence: Independent Able to Take Stairs?: Yes Driving: Yes Vocation: 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    Copywriter, advertising Overall Cognitive Status: Appears within functional limits for tasks assessed/performed Arousal/Alertness: Awake/alert Orientation Level: Appears intact for tasks assessed Behavior During Session: [facility] for tasks performed    Extremity/Trunk Assessment Right Lower Extremity Assessment RLE ROM/Strength/Tone: [facility] for tasks assessed Left Lower Extremity Assessment LLE ROM/Strength/Tone: [facility] for tasks assessed   Balance Balance Balance Assessed: No  End of Session PT - End of Session Equipment Utilized During Treatment: Gait belt Activity Tolerance: Patient limited by pain Patient left: in bed;with call bell/phone within reach;with nursing in room;with family/visitor present (daughter) Nurse Communication: Mobility status  GP Functional Assessment Tool Used: clinical judgement Functional Limitation: Mobility: Walking and moving around Mobility: Walking and Moving Around Current Status [phone]): At least 40 percent but less than 60 percent impaired, limited or restricted Mobility: Walking and Moving Around Goal Status [phone]): At least 1 percent but less than 20 percent impaired, limited or restricted   Feltis, Nicki Reaper 02/28/2013, 12:05 PM  Nicki Reaper. Feltis, PT, DPT [phone]

## 2013-03-01 LAB — CBC
HCT: 44.6 % (ref 36.0–46.0)
MCH: 28.6 pg (ref 26.0–34.0)
MCHC: 32.3 g/dL (ref 30.0–36.0)
MCV: 88.7 fL (ref 78.0–100.0)
Platelets: 266 10*3/uL (ref 150–400)
RDW: 13.8 % (ref 11.5–15.5)
WBC: 6.7 10*3/uL (ref 4.0–10.5)

## 2013-03-01 LAB — BASIC METABOLIC PANEL
BUN: 15 mg/dL (ref 6–23)
Calcium: 9.7 mg/dL (ref 8.4–10.5)
Creatinine, Ser: 0.67 mg/dL (ref 0.50–1.10)
GFR calc Af Amer: >90 mL/min (ref >90)

## 2013-03-01 LAB — URINALYSIS, ROUTINE W REFLEX MICROSCOPIC
Bilirubin Urine: NEGATIVE
Hgb urine dipstick: NEGATIVE
Ketones, ur: NEGATIVE mg/dL
Nitrite: NEGATIVE
Specific Gravity, Urine: 1.017 (ref 1.005–1.030)
Urobilinogen, UA: 0.2 mg/dL (ref 0.0–1.0)

## 2013-03-01 LAB — MAGNESIUM: Magnesium: 2.1 mg/dL (ref 1.5–2.5)

## 2013-03-01 MED ORDER — ACETAMINOPHEN 500 MG PO TABS
500.0000 mg | ORAL_TABLET | Freq: Three times a day (TID) | ORAL | Status: DC
  Administered 2013-03-01 – 2013-03-06 (×16): 500 mg via ORAL
  Filled 2013-03-01 (×19): qty 1

## 2013-03-01 MED ORDER — MIDAZOLAM HCL 2 MG/2ML IJ SOLN
1.0000 mg | INTRAMUSCULAR | Status: DC | PRN
Start: 2013-03-01 — End: 2013-03-01

## 2013-03-01 MED ORDER — FENTANYL CITRATE 0.05 MG/ML IJ SOLN: 50.0000 ug | INTRAMUSCULAR | Status: DC | PRN

## 2013-03-01 MED ORDER — BISACODYL 10 MG RE SUPP
10.0000 mg | Freq: Once | RECTAL | Status: AC
  Administered 2013-03-01: 10 mg via RECTAL
  Filled 2013-03-01: qty 1

## 2013-03-01 MED ORDER — LACTATED RINGERS IV SOLN
INTRAVENOUS | Status: DC
  Administered 2013-03-02: 50 mL/h via INTRAVENOUS

## 2013-03-01 MED ORDER — DEXTROSE 5 % IV SOLN
1.0000 g | INTRAVENOUS | Status: DC
  Administered 2013-03-01 – 2013-03-04 (×4): 1 g via INTRAVENOUS
  Filled 2013-03-01 (×5): qty 10

## 2013-03-01 NOTE — Progress Notes (Signed)
UR COMPLETED  

## 2013-03-01 NOTE — Progress Notes (Signed)
PT Cancellation Note  Patient Details Name: KENSINGTON RIOS MRN: [id number] DOB: 11/12/1931   Cancelled Treatment:    Reason Eval/Treat Not Completed: Medical issues which prohibited therapy Attempted to see pt. Pt reports she fell last night in the bathroom while hallucinating but none of this is documented in chart. Will talk to nursing. Pt in great pain. Pt scheduled for Kyphoplasty tomorrow Magdalene Molly 3/12). Will defer this eval until Friday.    GREEN,PETER 03/01/2013, 12:07 PM Peter L. Green DPT [phone]

## 2013-03-01 NOTE — Progress Notes (Signed)
Subjective: Patient reports still confused intermittently, overall better pain control.  Objective: Vital signs in last 24 hours: Temp:  [97.4 F (36.3 C)-98.1 F (36.7 C)] 98.1 F (36.7 C) (03/12 0500) Pulse Rate:  [56-73] 64 (03/12 0500) Resp:  [16-18] 18 (03/12 0500) BP: (130-163)/(55-81) 142/71 mmHg (03/12 0500) SpO2:  [95 %-96 %] 96 % (03/12 0500)  Intake/Output from previous day: 03/11 0701 - 03/12 0700 In: 915 [P.O.:240; I.V.:675] Out: -  Intake/Output this shift:    Physical Exam: Stable  Lab Results:  Recent Labs  02/27/13 1832 03/01/13 0605  WBC 10.9* 6.7  HGB 13.8 14.4  HCT 40.4 44.6  PLT 332 266   BMET  Recent Labs  02/27/13 1832  NA 137  K 3.3*  CL 98  CO2 24  GLUCOSE 105*  BUN 21  CREATININE 0.85  CALCIUM 10.1    Studies/Results: No results found.  Assessment/Plan: Continue pain management today, kyphoplasty tomorrow.    LOS: 2 days    Dorian Heckle, MD 03/01/2013, 7:19 AM

## 2013-03-01 NOTE — Progress Notes (Signed)
Attempted to see pt.  Pt reports she fell last night in the bathroom while hallucinating but none of this is documented in chart.  Will talk to nursing.  Pt in great pain. Pt scheduled for Kyphoplasty tomorrow [name] 3/12).  Will defer this eval until Friday. Tory Emerald, OTR/L

## 2013-03-01 NOTE — ED Provider Notes (Signed)
I saw and evaluated the patient, reviewed the resident's note and I agree with the findings and plan.  81yF essentially presenting for pain control on back pain. nonfocal neuro exam. Will admit for further pain management and likely kyphoplasty later this week.   Raeford Razor, MD 03/01/13 2118

## 2013-03-01 NOTE — H&P (Signed)
Nicolina Hirt #[id number] DOB:  Apr 22, 1931   02/27/2013:  Elliot Dally returns today for recheck to review her MRI which shows that she has an old T11 fracture and a severe L1 fracture. This was read by the radiologist a being a burst fracture, but to me is a severe compression fracture, but not a burst fracture.  There is no splaying of the pedicles or posterior element fracture.  There is some mild retropulsion into the spinal canal, but not severe.    She says that she is miserable and unable to get any relief from her pain and has therefore requested that she be admitted to the hospital.  I have recommended that we go ahead with the kyphoplasty procedure at L1. I explained that because this is a severe fracture it may be necessary to do additional surgery if we are not able to stabilize this broken vertebra with a kyphoplasty procedure.  At the same time the T11 fracture appears to be old and I do not think anything needs to be done to that.    Because she is in so much pain, I did advise her to go to the emergency room to be admitted to the hospital and she has done so. The plan is for [place] to perform kyphoplasty procedure on 03/02/2013.  Risks and benefits were discussed with the patient and she wishes to proceed.          Danae Orleans. 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  She describes pain currently at 5 to 6/10, but up to 9 to 10/10 and says when she is off pain medications.  She is currently taking Hydrocodone 5/325 two tablets three to four times daily.  She said she fell off her bed in October and awoke last Monday in terrible pain.  She has an old history of 1990 vertebral and wrist fracture after a fall and also bilateral elbow fracture.  She did not remember the vertebral fracture in 1990, but  this was gleaned from her chart. She is currently in a wheelchair because of the severity of her pain. She said she scooted on her bottom after putting o socks and fell on her bottom six weeks ago.    The plain radiographs and CT scan of her spine demonstrate that she has a fracture of both T11 and of L1.  She is not sure whether these are old or new.   REVIEW OF SYSTEMS:   A detailed Review of Systems sheet was reviewed with the patient.  Pertinent positives include under eyes - she wears glasses, under ear, nose, mouth, and throat - she notes some ringing in her ears, under cardiovascular - she notes high blood pressure and high cholesterol, under musculoskeletal - she notes arm weakness, back pain, arm pain and leg pain.  All other systems are negative; this includes Constitutional symptoms, Endocrine, Respiratory, Gastrointestinal, Genitourinary, Integumentary & Breast, Neurologic, Psychiatric, Hematologic/Lymphatic, Allergic/Immunologic.    PAST MEDICAL HISTORY:      Current Medical Conditions:    She has a history of ovarian cancer. She has hypertension and elevated cholesterol.      Prior Operations and Hospitalizations:   She has had previous left elbow surgery in 1993, surgery for ovarian cancer in 2006 and 2007,  along with hysterectomy, hernia, and colon resection.  She has had left elbow surgery again in 1997.      Medications and Allergies:  Her current medications include Claritin 10 mg. daily, Hydrochlorothiazide 25 mg. daily, Fosamax 70 mg. weekly, Calcium and Vitamin D three times daily, Amlodipine 5 mg. once daily, Niaspan 500 mg. CR one tablet at bedtime, Metoprolol Tartrate 25 mg. one tablet twice daily, Ocuvite tablets one daily, Synthroid 200 mcg. once daily, Crestor 20 mg. one tablet daily, Aspirin 81 mg. daily.      Height and Weight:     She is 5'1" tall, 190 lbs.  BMI is 36.0.    SOCIAL HISTORY:    She denies tobacco or drug use. She is a social drinker of alcoholic  beverages.    DIAGNOSTIC STUDIES:   I reviewed the patient's plain radiographs in the office as well as her CT scan, which demonstrates what appears to be an acute fracture of L1 with 25% canal stenosis.  The plain lumbar radiographs were reviewed which demonstrates mild T11 and moderate L1 vertebral compression fractures of indeterminant age.  There is some calcific atherosclerosis.    PHYSICAL EXAMINATION:      General Appearance:   On examination today, Mrs. Coller is a pleasant and cooperative woman in no acute distress.  She is currently in a wheelchair.      Blood Pressure, Pulse:     Her blood pressure is 142/78.  Heart rate is 72 and regular. Respiratory rate is 18.      HEENT - normocephalic, atraumatic.  The pupils are equal, round and reactive to light.  The extraocular muscles are intact.  Sclerae - white.  Conjunctiva - pink.  Oropharynx benign.  Uvula midline.     Neck - there are no masses, meningismus, deformities, tracheal deviation, jugular vein distention or carotid bruits.  There is normal cervical range of motion.  Spurlings' test is negative without reproducible radicular pain turning the patient's head to either side.  Lhermitte's sign is not present with axial compression.      Respiratory - there is normal respiratory effort with good intercostal function.  Lungs are clear to auscultation.  There are no rales, rhonchi or wheezes.      Cardiovascular - the heart has regular rate and rhythm to auscultation.  No murmurs are appreciated.  There is no extremity edema, cyanosis or clubbing.  There are palpable pedal pulses.      Abdomen - soft, nontender, no hepatosplenomegaly appreciated or masses.  There are active bowel sounds.  No guarding or rebound.      Musculoskeletal Examination - Mrs. Frey has tenderness over her lower thoracic and upper lumbar spine as well as of her lumbar spine to palpation. She does not appear to have severe paravertebral spasm.  She is not  complaining of any leg pain or numbness.      NEUROLOGICAL EXAMINATION: The patient is oriented to time, person and place and has good recall of both recent and remote memory with normal attention span and concentration.  The patient speaks with clear and fluent speech and exhibits normal language function and appropriate fund of knowledge.      Cranial Nerve Examination - pupils are equal, round and reactive to light.  Extraocular movements are full.  Visual fields are full to confrontational testing.  Facial sensation and facial movement are symmetric and intact.  Hearing is intact to finger rub.  Palate is upgoing.  Shoulder shrug is  symmetric.  Tongue protrudes in the midline.      Motor Examination - motor strength is 5/5 in the bilateral deltoids, biceps, triceps, handgrips, wrist extensors, interosseous.  In the lower extremities motor strength is 5/5 in hip flexion, extension, quadriceps, hamstrings, plantar flexion, dorsiflexion and extensor hallucis longus.      Sensory Examination - normal to light touch and pinprick sensation in the upper and lower extremities.     Deep Tendon Reflexes - 2 in the biceps, triceps, and brachioradialis, 2 in the knees, 2 in the ankles.  The great toes are downgoing to plantar stimulation.      Cerebellar Examination - normal coordination in upper and lower extremities and normal rapid alternating movements.  Romberg test is negative.    IMPRESSION AND RECOMMENDATIONS: Othella Slappey is a 79 year old woman with back pain and with compression fractures. She has a history of a previous compression fracture and has known osteoporosis and is on Fosamax.  I have recommended since it is unclear which of these fractures is new or old that we go ahead with MRIs of her thoracic and lumbar spine.  If she has significant new fractures or progression of an old fracture with newer component, then I think these should be treated with kyphoplasty procedure given the severity of  her pain.  I will make further recommendations after her MRI has been done and I have for this to be done on an expedited basis.     [facility]    Danae Orleans. Venetia Maxon, M.D.

## 2013-03-01 NOTE — Progress Notes (Signed)
Patient ID: [name]  female  BJY:[id number]    DOB: May 22, 1931    DOA: 02/27/2013  PCP: Hoyle Sauer, MD  Assessment/Plan: Principal Problem:   Fracture of L1 vertebra with acute back pain - Continue pain control, bedrest, kyphoplasty tomorrow - Also added Tylenol ES every 8 hours  Active Problems:   HTN (hypertension): Stable, continue home medication    RBBB: Stable  Chronic low back pain: Continue pain control  Hypothyroidism: Continue Synthroid  DVT Prophylaxis:  Code Status:  Disposition:   Subjective: Still somewhat intermittently confused, states fell in the bathroom last night during her confusion episode. Pain somewhat better. No leg pain numbness or tingling  Objective: Weight change:   Intake/Output Summary (Last 24 hours) at 03/01/13 1321 Last data filed at 02/28/13 1800  Gross per 24 hour  Intake    675 ml  Output      0 ml  Net    675 ml   Blood pressure 123/51, pulse 65, temperature 98.1 F (36.7 C), temperature source Oral, resp. rate 18, SpO2 96.00%.  Physical Exam: General: Alert and awake, oriented x3, not in any acute distress. CVS: S1-S2 clear, no murmur rubs or gallops Chest: clear to auscultation bilaterally, no wheezing, rales or rhonchi Abdomen: soft nontender, nondistended, normal bowel sounds,  Extremities: no cyanosis, clubbing or edema noted bilaterally Neuro: Cranial nerves II-XII intact, no focal neurological deficits  Lab Results: Basic Metabolic Panel:  Recent Labs Lab 02/27/13 1832 03/01/13 0605  NA 137 139  K 3.3* 4.3  CL 98 101  CO2 24 24  GLUCOSE 105* 99  BUN 21 15  CREATININE 0.85 0.67  CALCIUM 10.1 9.7  MG  --  2.1   Liver Function Tests: No results found for this basename: AST, ALT, ALKPHOS, BILITOT, PROT, ALBUMIN,  in the last 168 hours No results found for this basename: LIPASE, AMYLASE,  in the last 168 hours No results found for this basename: AMMONIA,  in the last 168 hours CBC:  Recent  Labs Lab 02/27/13 1832 03/01/13 0605  WBC 10.9* 6.7  NEUTROABS 8.4*  --   HGB 13.8 14.4  HCT 40.4 44.6  MCV 87.4 88.7  PLT 332 266   Cardiac Enzymes: No results found for this basename: CKTOTAL, CKMB, CKMBINDEX, TROPONINI,  in the last 168 hours BNP: No components found with this basename: POCBNP,  CBG: No results found for this basename: GLUCAP,  in the last 168 hours   Micro Results: No results found for this or any previous visit (from the past 240 hour(s)).  Studies/Results: Mr Thoracic Spine Wo Contrast  02/26/2013  *RADIOLOGY REPORT*  Clinical Data:  Compression fracture.  Back pain 2 weeks  MRI THORACIC AND LUMBAR SPINE WITHOUT CONTRAST  Technique:  Multiplanar and multiecho pulse sequences of the thoracic and lumbar spine were obtained without intravenous contrast.  Comparison:  None  MRI THORACIC SPINE  Findings: Mild chronic compression fracture T3 and T11 without bone marrow edema.  Severe burst fracture of L1 which is described in a separate lumbar MRI report.  No mass lesion in the thoracic spine. There is no cord compression.  Spinal cord signal is normal.  Disc degeneration and spurring at T10-11 related to the chronic fracture of T11.  There is mild posterior element hypertrophy and mild spinal stenosis at this level.  No acute disc protrusion or cord deformity in the thoracic spine.  IMPRESSION:  Chronic compression fractures T3 and T11.  No acute fracture or mass  in the thoracic spine.  Burst fracture L1, see below report  MRI LUMBAR SPINE  Findings: Burst fracture L1 with comminuted vertebral body and retropulsion of bone into the spinal canal.  No compression of the conus medullaris.  There is mild spinal stenosis at this level. There is diffuse bone marrow edema in the vertebral body consistent with an acute fracture.  This is most likely a benign fracture.  Chronic fracture T11.  No other lumbar fracture.  L1-2:  Mild disc degeneration  L2-3:  Negative  L3-4:  Mild disc  and facet degeneration without stenosis  L4-5:  3 mm anterior slip.  Disc and facet degeneration with moderate spinal stenosis.  There is advanced facet hypertrophy bilaterally causing mild foraminal narrowing.  L5-S1:  Mild disc and facet degeneration.  Mild anterior slip.  IMPRESSION:  Acute burst fracture of L1 with retropulsion of bone into the spinal canal and mild spinal stenosis.  No compression of the conus medullaris.  This appears to be an acute fracture.  Moderate spinal stenosis L4-5.   Original Report Authenticated By: Janeece Riggers, M.D.    Mr Lumbar Spine Wo Contrast  02/26/2013  *RADIOLOGY REPORT*  Clinical Data:  Compression fracture.  Back pain 2 weeks  MRI THORACIC AND LUMBAR SPINE WITHOUT CONTRAST  Technique:  Multiplanar and multiecho pulse sequences of the thoracic and lumbar spine were obtained without intravenous contrast.  Comparison:  None  MRI THORACIC SPINE  Findings: Mild chronic compression fracture T3 and T11 without bone marrow edema.  Severe burst fracture of L1 which is described in a separate lumbar MRI report.  No mass lesion in the thoracic spine. There is no cord compression.  Spinal cord signal is normal.  Disc degeneration and spurring at T10-11 related to the chronic fracture of T11.  There is mild posterior element hypertrophy and mild spinal stenosis at this level.  No acute disc protrusion or cord deformity in the thoracic spine.  IMPRESSION:  Chronic compression fractures T3 and T11.  No acute fracture or mass in the thoracic spine.  Burst fracture L1, see below report  MRI LUMBAR SPINE  Findings: Burst fracture L1 with comminuted vertebral body and retropulsion of bone into the spinal canal.  No compression of the conus medullaris.  There is mild spinal stenosis at this level. There is diffuse bone marrow edema in the vertebral body consistent with an acute fracture.  This is most likely a benign fracture.  Chronic fracture T11.  No other lumbar fracture.  L1-2:  Mild  disc degeneration  L2-3:  Negative  L3-4:  Mild disc and facet degeneration without stenosis  L4-5:  3 mm anterior slip.  Disc and facet degeneration with moderate spinal stenosis.  There is advanced facet hypertrophy bilaterally causing mild foraminal narrowing.  L5-S1:  Mild disc and facet degeneration.  Mild anterior slip.  IMPRESSION:  Acute burst fracture of L1 with retropulsion of bone into the spinal canal and mild spinal stenosis.  No compression of the conus medullaris.  This appears to be an acute fracture.  Moderate spinal stenosis L4-5.   Original Report Authenticated By: Janeece Riggers, M.D.     Medications: Scheduled Meds: . acetaminophen  500 mg Oral TID  . amLODipine  5 mg Oral Daily  . [START ON 03/02/2013]  ceFAZolin (ANCEF) IV  2 g Intravenous On Call to OR  . docusate sodium  100 mg Oral BID  . hydrochlorothiazide  25 mg Oral Daily  . irbesartan  150  mg Oral Daily  . levothyroxine  200 mcg Oral QAC breakfast  . metoprolol tartrate  25 mg Oral BID  . niacin  500 mg Oral QHS  . polyethylene glycol  17 g Oral Daily  . senna  1 tablet Oral BID  . sodium chloride  3 mL Intravenous Q12H      LOS: 2 days   RAI,RIPUDEEP M.D. [facility] 03/01/2013, 1:21 PM Pager: [phone]  If 7PM-7AM, please contact night-coverage [email or web address] Password [email or web address]

## 2013-03-02 ENCOUNTER — Inpatient Hospital Stay (HOSPITAL_COMMUNITY): Payer: Medicare Other

## 2013-03-02 ENCOUNTER — Encounter (HOSPITAL_COMMUNITY): Payer: Self-pay | Admitting: Anesthesiology

## 2013-03-02 ENCOUNTER — Encounter (HOSPITAL_COMMUNITY)
Admission: EM | Disposition: A | Discharge status: Home / Self Care | Payer: Self-pay | Source: Home / Self Care | Attending: Internal Medicine

## 2013-03-02 ENCOUNTER — Inpatient Hospital Stay (HOSPITAL_COMMUNITY): Payer: Medicare Other | Admitting: Anesthesiology

## 2013-03-02 DIAGNOSIS — N390 Urinary tract infection, site not specified: Secondary | ICD-10-CM | POA: Diagnosis present

## 2013-03-02 HISTORY — PX: KYPHOPLASTY: SHX5884

## 2013-03-02 LAB — CBC
Platelets: 307 10*3/uL (ref 150–400)
RBC: 4.2 MIL/uL (ref 3.87–5.11)
RDW: 13.7 % (ref 11.5–15.5)
WBC: 8.3 10*3/uL (ref 4.0–10.5)

## 2013-03-02 LAB — COMPREHENSIVE METABOLIC PANEL
Alkaline Phosphatase: 79 U/L (ref 39–117)
BUN: 15 mg/dL (ref 6–23)
Chloride: 103 mEq/L (ref 96–112)
Creatinine, Ser: 0.72 mg/dL (ref 0.50–1.10)
GFR calc Af Amer: >90 mL/min (ref >90)
GFR calc non Af Amer: 80 mL/min — ABNORMAL LOW (ref >90)
Glucose, Bld: 99 mg/dL (ref 70–99)
Potassium: 3.9 mEq/L (ref 3.5–5.1)
Total Bilirubin: 0.3 mg/dL (ref 0.3–1.2)

## 2013-03-02 LAB — SURGICAL PCR SCREEN
MRSA, PCR: NEGATIVE
Staphylococcus aureus: NEGATIVE

## 2013-03-02 SURGERY — KYPHOPLASTY
Anesthesia: General | Site: Back | Wound class: Clean

## 2013-03-02 MED ORDER — MENTHOL 3 MG MT LOZG: 1.0000 | LOZENGE | OROMUCOSAL | Status: DC | PRN

## 2013-03-02 MED ORDER — HYDROCODONE-ACETAMINOPHEN 5-325 MG PO TABS
1.0000 | ORAL_TABLET | ORAL | Status: DC | PRN
  Administered 2013-03-05 – 2013-03-06 (×2): 2 via ORAL
  Filled 2013-03-02 (×2): qty 2

## 2013-03-02 MED ORDER — SODIUM CHLORIDE 0.9 % IV SOLN: 250.0000 mL | INTRAVENOUS | Status: DC

## 2013-03-02 MED ORDER — PANTOPRAZOLE SODIUM 40 MG IV SOLR
40.0000 mg | Freq: Every day | INTRAVENOUS | Status: DC
  Administered 2013-03-02 – 2013-03-03 (×2): 40 mg via INTRAVENOUS
  Filled 2013-03-02 (×4): qty 40

## 2013-03-02 MED ORDER — ACETAMINOPHEN 325 MG PO TABS: 650.0000 mg | ORAL_TABLET | ORAL | Status: DC | PRN

## 2013-03-02 MED ORDER — LIDOCAINE-EPINEPHRINE 1 %-1:100000 IJ SOLN
INTRAMUSCULAR | Status: DC | PRN
  Administered 2013-03-02: 5 mL

## 2013-03-02 MED ORDER — LACTATED RINGERS IV SOLN
INTRAVENOUS | Status: DC | PRN
  Administered 2013-03-02: 19:00:00 via INTRAVENOUS

## 2013-03-02 MED ORDER — IOHEXOL 300 MG/ML  SOLN
INTRAMUSCULAR | Status: DC | PRN
  Administered 2013-03-02: 50 mL

## 2013-03-02 MED ORDER — GLYCOPYRROLATE 0.2 MG/ML IJ SOLN
INTRAMUSCULAR | Status: DC | PRN
  Administered 2013-03-02: .8 mg via INTRAVENOUS

## 2013-03-02 MED ORDER — DOCUSATE SODIUM 100 MG PO CAPS
100.0000 mg | ORAL_CAPSULE | Freq: Two times a day (BID) | ORAL | Status: DC
  Administered 2013-03-02 – 2013-03-06 (×8): 100 mg via ORAL
  Filled 2013-03-02 (×8): qty 1

## 2013-03-02 MED ORDER — ONDANSETRON HCL 4 MG/2ML IJ SOLN
INTRAMUSCULAR | Status: DC | PRN
  Administered 2013-03-02: 4 mg via INTRAVENOUS

## 2013-03-02 MED ORDER — LIDOCAINE HCL (CARDIAC) 20 MG/ML IV SOLN
INTRAVENOUS | Status: DC | PRN
  Administered 2013-03-02: 80 mg via INTRAVENOUS

## 2013-03-02 MED ORDER — NEOSTIGMINE METHYLSULFATE 1 MG/ML IJ SOLN
INTRAMUSCULAR | Status: DC | PRN
  Administered 2013-03-02: 5 mg via INTRAVENOUS

## 2013-03-02 MED ORDER — FENTANYL CITRATE 0.05 MG/ML IJ SOLN
INTRAMUSCULAR | Status: DC | PRN
  Administered 2013-03-02 (×2): 50 ug via INTRAVENOUS

## 2013-03-02 MED ORDER — KCL IN DEXTROSE-NACL 20-5-0.45 MEQ/L-%-% IV SOLN
INTRAVENOUS | Status: DC
  Administered 2013-03-02 – 2013-03-03 (×2): via INTRAVENOUS
  Filled 2013-03-02 (×6): qty 1000

## 2013-03-02 MED ORDER — BISACODYL 10 MG RE SUPP
10.0000 mg | Freq: Every day | RECTAL | Status: DC | PRN
  Administered 2013-03-04 (×2): 10 mg via RECTAL
  Filled 2013-03-02 (×3): qty 1

## 2013-03-02 MED ORDER — MIDAZOLAM HCL 5 MG/5ML IJ SOLN
INTRAMUSCULAR | Status: DC | PRN
  Administered 2013-03-02: 2 mg via INTRAVENOUS

## 2013-03-02 MED ORDER — DIAZEPAM 5 MG PO TABS
5.0000 mg | ORAL_TABLET | Freq: Four times a day (QID) | ORAL | Status: DC | PRN
  Administered 2013-03-03 – 2013-03-05 (×2): 5 mg via ORAL
  Filled 2013-03-02 (×2): qty 1

## 2013-03-02 MED ORDER — MORPHINE SULFATE 2 MG/ML IJ SOLN: 1.0000 mg | INTRAMUSCULAR | Status: DC | PRN

## 2013-03-02 MED ORDER — OXYCODONE-ACETAMINOPHEN 5-325 MG PO TABS
1.0000 | ORAL_TABLET | ORAL | Status: DC | PRN
  Administered 2013-03-03 (×2): 1 via ORAL
  Administered 2013-03-04: 2 via ORAL
  Administered 2013-03-04 (×3): 1 via ORAL
  Administered 2013-03-04 – 2013-03-05 (×3): 2 via ORAL
  Filled 2013-03-02: qty 1
  Filled 2013-03-02: qty 2
  Filled 2013-03-02 (×3): qty 1
  Filled 2013-03-02: qty 2
  Filled 2013-03-02: qty 1
  Filled 2013-03-02 (×2): qty 2

## 2013-03-02 MED ORDER — FLEET ENEMA 7-19 GM/118ML RE ENEM: 1.0000 | ENEMA | Freq: Once | RECTAL | Status: AC | PRN

## 2013-03-02 MED ORDER — BUPIVACAINE HCL (PF) 0.25 % IJ SOLN
INTRAMUSCULAR | Status: DC | PRN
  Administered 2013-03-02: 5 mL

## 2013-03-02 MED ORDER — ACETAMINOPHEN 650 MG RE SUPP: 650.0000 mg | RECTAL | Status: DC | PRN

## 2013-03-02 MED ORDER — SODIUM CHLORIDE 0.9 % IJ SOLN: 3.0000 mL | INTRAMUSCULAR | Status: DC | PRN

## 2013-03-02 MED ORDER — EPHEDRINE SULFATE 50 MG/ML IJ SOLN
INTRAMUSCULAR | Status: DC | PRN
  Administered 2013-03-02 (×3): 10 mg via INTRAVENOUS

## 2013-03-02 MED ORDER — ONDANSETRON HCL 4 MG/2ML IJ SOLN: 4.0000 mg | INTRAMUSCULAR | Status: DC | PRN

## 2013-03-02 MED ORDER — METOCLOPRAMIDE HCL 5 MG/ML IJ SOLN: 10.0000 mg | Freq: Once | INTRAMUSCULAR | Status: AC | PRN

## 2013-03-02 MED ORDER — HYDRALAZINE HCL 20 MG/ML IJ SOLN
10.0000 mg | Freq: Four times a day (QID) | INTRAMUSCULAR | Status: DC | PRN
  Filled 2013-03-02: qty 0.5

## 2013-03-02 MED ORDER — POLYETHYLENE GLYCOL 3350 17 G PO PACK: 17.0000 g | PACK | Freq: Every day | ORAL | Status: DC | PRN

## 2013-03-02 MED ORDER — PHENYLEPHRINE HCL 10 MG/ML IJ SOLN
INTRAMUSCULAR | Status: DC | PRN
  Administered 2013-03-02 (×2): 40 ug via INTRAVENOUS

## 2013-03-02 MED ORDER — SODIUM CHLORIDE 0.9 % IJ SOLN
3.0000 mL | Freq: Two times a day (BID) | INTRAMUSCULAR | Status: DC
  Administered 2013-03-02 – 2013-03-05 (×3): 3 mL via INTRAVENOUS

## 2013-03-02 MED ORDER — PROPOFOL 10 MG/ML IV BOLUS
INTRAVENOUS | Status: DC | PRN
  Administered 2013-03-02: 140 mg via INTRAVENOUS

## 2013-03-02 MED ORDER — ROCURONIUM BROMIDE 100 MG/10ML IV SOLN
INTRAVENOUS | Status: DC | PRN
  Administered 2013-03-02: 40 mg via INTRAVENOUS

## 2013-03-02 MED ORDER — CEFAZOLIN SODIUM 1-5 GM-% IV SOLN
1.0000 g | Freq: Three times a day (TID) | INTRAVENOUS | Status: AC
  Administered 2013-03-03 (×2): 1 g via INTRAVENOUS
  Filled 2013-03-02 (×3): qty 50

## 2013-03-02 MED ORDER — PHENOL 1.4 % MT LIQD: 1.0000 | OROMUCOSAL | Status: DC | PRN

## 2013-03-02 SURGICAL SUPPLY — 42 items
BLADE SURG 15 STRL LF DISP TIS (BLADE) ×1 IMPLANT
BLADE SURG 15 STRL SS (BLADE) ×1
BLADE SURG ROTATE 9660 (MISCELLANEOUS) IMPLANT
CEMENT BONE KYPHX HV R (Orthopedic Implant) ×2 IMPLANT
CLOTH BEACON ORANGE TIMEOUT ST (SAFETY) ×2 IMPLANT
CONT SPEC 4OZ CLIKSEAL STRL BL (MISCELLANEOUS) ×4 IMPLANT
DERMABOND ADVANCED (GAUZE/BANDAGES/DRESSINGS) ×1
DERMABOND ADVANCED .7 DNX12 (GAUZE/BANDAGES/DRESSINGS) ×1 IMPLANT
DRAPE C-ARM 42X72 X-RAY (DRAPES) ×2 IMPLANT
DRAPE LAPAROTOMY 100X72X124 (DRAPES) ×2 IMPLANT
DRAPE PROXIMA HALF (DRAPES) ×2 IMPLANT
DRAPE SURG 17X23 STRL (DRAPES) ×2 IMPLANT
DRESSING TELFA 8X3 (GAUZE/BANDAGES/DRESSINGS) IMPLANT
DURAPREP 26ML APPLICATOR (WOUND CARE) ×2 IMPLANT
GAUZE SPONGE 4X4 16PLY XRAY LF (GAUZE/BANDAGES/DRESSINGS) ×2 IMPLANT
GLOVE BIO SURGEON STRL SZ7 (GLOVE) ×2 IMPLANT
GLOVE BIO SURGEON STRL SZ8 (GLOVE) ×2 IMPLANT
GLOVE BIOGEL PI IND STRL 7.5 (GLOVE) ×1 IMPLANT
GLOVE BIOGEL PI IND STRL 8.5 (GLOVE) ×1 IMPLANT
GLOVE BIOGEL PI INDICATOR 7.5 (GLOVE) ×1
GLOVE BIOGEL PI INDICATOR 8.5 (GLOVE) ×1
GLOVE EXAM NITRILE LRG STRL (GLOVE) IMPLANT
GLOVE EXAM NITRILE MD LF STRL (GLOVE) ×2 IMPLANT
GLOVE EXAM NITRILE XL STR (GLOVE) IMPLANT
GLOVE EXAM NITRILE XS STR PU (GLOVE) IMPLANT
GOWN BRE IMP SLV AUR LG STRL (GOWN DISPOSABLE) ×2 IMPLANT
GOWN BRE IMP SLV AUR XL STRL (GOWN DISPOSABLE) ×2 IMPLANT
GOWN STRL REIN 2XL LVL4 (GOWN DISPOSABLE) IMPLANT
KIT BASIN OR (CUSTOM PROCEDURE TRAY) ×2 IMPLANT
KIT ROOM TURNOVER OR (KITS) ×2 IMPLANT
NEEDLE HYPO 25X1 1.5 SAFETY (NEEDLE) ×2 IMPLANT
NS IRRIG 1000ML POUR BTL (IV SOLUTION) ×2 IMPLANT
PACK SURGICAL SETUP 50X90 (CUSTOM PROCEDURE TRAY) ×2 IMPLANT
PAD ARMBOARD 7.5X6 YLW CONV (MISCELLANEOUS) ×6 IMPLANT
STAPLER SKIN PROX WIDE 3.9 (STAPLE) ×2 IMPLANT
SUT VIC AB 3-0 SH 8-18 (SUTURE) ×2 IMPLANT
SYR CONTROL 10ML LL (SYRINGE) ×2 IMPLANT
TOWEL OR 17X24 6PK STRL BLUE (TOWEL DISPOSABLE) ×2 IMPLANT
TOWEL OR 17X26 10 PK STRL BLUE (TOWEL DISPOSABLE) ×2 IMPLANT
TRAY KYPHOPAK 15/3 ONESTEP 1ST (MISCELLANEOUS) ×2 IMPLANT
TRAY KYPHOPAK 20/3 ONESTEP 1ST (MISCELLANEOUS) IMPLANT
TRAY KYPHOPAK 20/3 ONESTEP CDS (KITS) ×2 IMPLANT

## 2013-03-02 NOTE — Interval H&P Note (Signed)
History and Physical Interval Note:  03/02/2013 6:28 AM  [name]  has presented today for surgery, with the diagnosis of Pathologic fracture, Lumbago  The various methods of treatment have been discussed with the patient and family. After consideration of risks, benefits and other options for treatment, the patient has consented to  Procedure(s) with comments: KYPHOPLASTY (N/A) - L1 Kyphoplasty as a surgical intervention .  The patient's history has been reviewed, patient examined, no change in status, stable for surgery.  I have reviewed the patient's chart and labs.  Questions were answered to the patient's satisfaction.     STERN,JOSEPH D

## 2013-03-02 NOTE — Anesthesia Postprocedure Evaluation (Signed)
  Anesthesia Post-op Note  Patient: [name]  Procedure(s) Performed: Procedure(s) with comments: KYPHOPLASTY (N/A) - Lumbar One Kyphoplasty  Patient Location: PACU  Anesthesia Type:General  Level of Consciousness: awake and alert   Airway and Oxygen Therapy: Patient Spontanous Breathing  Post-op Pain: mild  Post-op Assessment: Post-op Vital signs reviewed, Patient's Cardiovascular Status Stable, Respiratory Function Stable, Patent Airway, No signs of Nausea or vomiting and Pain level controlled  Post-op Vital Signs: stable  Complications: No apparent anesthesia complications

## 2013-03-02 NOTE — Op Note (Signed)
02/27/2013 - 03/02/2013  8:29 PM  PATIENT:  [name]  79 y.o. female  PRE-OPERATIVE DIAGNOSIS:  Pathologic fracture L 1, Lumbago  POST-OPERATIVE DIAGNOSIS:  Pathologic fracture L 1, Lumbago  PROCEDURE:  Procedure(s) with comments: KYPHOPLASTY (N/A) - Lumbar One Kyphoplasty  SURGEON:  Surgeon(s) and Role:    * Judi Jaffe, MD - Primary  PHYSICIAN ASSISTANT:   ASSISTANTS: none   ANESTHESIA:   general  EBL:     BLOOD ADMINISTERED:none  DRAINS: none   LOCAL MEDICATIONS USED:  LIDOCAINE   SPECIMEN:  No Specimen  DISPOSITION OF SPECIMEN:  N/A  COUNTS:  YES  TOURNIQUET:  * No tourniquets in log *  DICTATION: DICTATION: Patient is 79 year old woman with severe intractable back pain who had to be admitted because of the severity of her pain.  She has a severe L 1 compression fracture refractory to conservative management.  It was elected to take her to surgery for kyphoplasty procedure.  PROCEDURE:  Following the smooth and uncomplicated induction of general endotracheal anesthesia, the patient was placed in a prone position on chest rolls.  C-arm fluoroscopy was positioned in both the AP and lateral planes, centered on the L1 vertebra.  Her back was prepped and draped in the usual sterile fashion with Duraprep.  Using a right extra-pedicular approach, the right L1 pedicle and vertebral body were entered with the trochar using standard landmarks.  The drill was used, followed by a 15 cc Kyphon balloon, which was used to re-expand the broken vertebra.  Subsequently, 7 cc of bone cement was placed into the void created by the balloon and was seen to fill the fracture cleft and fill the vertebra in both the AP and lateral direction with good interdigitation and with minimal  Extravasation into the L12 disc space. The bone void filler was then removed.  Final X-ray demonstrated good filling within the fractured vertebra.  The incision was closed with a single 3-0 vicryl stitch and  dressed with Dermabond. The patient was returned to the OR gurney and extubated in the OR and taken to Recovery in stable and satisfactory condition, having tolerated the procedure well.  Counts were correct at the end of the case.   PLAN OF CARE: Admit to inpatient   PATIENT DISPOSITION:  PACU - hemodynamically stable.   Delay start of Pharmacological VTE agent (>24hrs) due to surgical blood loss or risk of bleeding: yes  

## 2013-03-02 NOTE — Anesthesia Preprocedure Evaluation (Addendum)
Anesthesia Evaluation  Patient identified by MRN, date of birth, ID band Patient awake    Reviewed: Allergy & Precautions, H&P , NPO status , Patient's Chart, lab work & pertinent test results, reviewed documented beta blocker date and time   Airway Mallampati: II TM Distance: >3 FB Neck ROM: full    Dental   Pulmonary neg pulmonary ROS,  breath sounds clear to auscultation        Cardiovascular hypertension, On Medications and On Home Beta Blockers + dysrhythmias Rhythm:regular Rate:Normal     Neuro/Psych  Neuromuscular disease negative psych ROS   GI/Hepatic Neg liver ROS, hiatal hernia,   Endo/Other  negative endocrine ROSHypothyroidism   Renal/GU negative Renal ROS  negative genitourinary   Musculoskeletal   Abdominal (+) + obese,   Peds  Hematology negative hematology ROS (+)   Anesthesia Other Findings See surgeon's H&P   Reproductive/Obstetrics negative OB ROS                          Anesthesia Physical Anesthesia Plan  ASA: III  Anesthesia Plan: General   Post-op Pain Management:    Induction: Intravenous  Airway Management Planned: Oral ETT  Additional Equipment:   Intra-op Plan:   Post-operative Plan: Extubation in OR  Informed Consent: I have reviewed the patients History and Physical, chart, labs and discussed the procedure including the risks, benefits and alternatives for the proposed anesthesia with the patient or authorized representative who has indicated his/her understanding and acceptance.   Dental Advisory Given  Plan Discussed with: CRNA and Surgeon  Anesthesia Plan Comments:         Anesthesia Quick Evaluation

## 2013-03-02 NOTE — Progress Notes (Signed)
Plan kyphoplasty this afternoon.

## 2013-03-02 NOTE — Brief Op Note (Signed)
02/27/2013 - 03/02/2013  8:29 PM  PATIENT:  [name]  79 y.o. female  PRE-OPERATIVE DIAGNOSIS:  Pathologic fracture L 1, Lumbago  POST-OPERATIVE DIAGNOSIS:  Pathologic fracture L 1, Lumbago  PROCEDURE:  Procedure(s) with comments: KYPHOPLASTY (N/A) - Lumbar One Kyphoplasty  SURGEON:  Surgeon(s) and Role:    * Maeola Harman, MD - Primary  PHYSICIAN ASSISTANT:   ASSISTANTS: none   ANESTHESIA:   general  EBL:     BLOOD ADMINISTERED:none  DRAINS: none   LOCAL MEDICATIONS USED:  LIDOCAINE   SPECIMEN:  No Specimen  DISPOSITION OF SPECIMEN:  N/A  COUNTS:  YES  TOURNIQUET:  * No tourniquets in log *  DICTATION: DICTATION: Patient is 79 year old woman with severe intractable back pain who had to be admitted because of the severity of her pain.  She has a severe L 1 compression fracture refractory to conservative management.  It was elected to take her to surgery for kyphoplasty procedure.  PROCEDURE:  Following the smooth and uncomplicated induction of general endotracheal anesthesia, the patient was placed in a prone position on chest rolls.  C-arm fluoroscopy was positioned in both the AP and lateral planes, centered on the L1 vertebra.  Her back was prepped and draped in the usual sterile fashion with Duraprep.  Using a right extra-pedicular approach, the right L1 pedicle and vertebral body were entered with the trochar using standard landmarks.  The drill was used, followed by a 15 cc Kyphon balloon, which was used to re-expand the broken vertebra.  Subsequently, 7 cc of bone cement was placed into the void created by the balloon and was seen to fill the fracture cleft and fill the vertebra in both the AP and lateral direction with good interdigitation and with minimal  Extravasation into the L12 disc space. The bone void filler was then removed.  Final X-ray demonstrated good filling within the fractured vertebra.  The incision was closed with a single 3-0 vicryl stitch and  dressed with Dermabond. The patient was returned to the OR gurney and extubated in the OR and taken to Recovery in stable and satisfactory condition, having tolerated the procedure well.  Counts were correct at the end of the case.   PLAN OF CARE: Admit to inpatient   PATIENT DISPOSITION:  PACU - hemodynamically stable.   Delay start of Pharmacological VTE agent (>24hrs) due to surgical blood loss or risk of bleeding: yes

## 2013-03-02 NOTE — Progress Notes (Signed)
Subjective: Patient reports "Good morning! I'm looking forward to getting my back fixed today"  Objective: Vital signs in last 24 hours: Temp:  [98.2 F (36.8 C)-98.3 F (36.8 C)] 98.2 F (36.8 C) (03/13 0513) Pulse Rate:  [54-66] 56 (03/13 0513) Resp:  [18] 18 (03/13 0513) BP: (123-181)/(51-65) 181/65 mmHg (03/13 0513) SpO2:  [97 %-98 %] 98 % (03/13 0513)  Intake/Output from previous day: 03/12 0701 - 03/13 0700 In: 480 [P.O.:480] Out: -  Intake/Output this shift:    Alert, conversant. Reporting no new problems. Back pain persists.   Lab Results:  Recent Labs  03/01/13 0605 03/02/13 0114  WBC 6.7 8.3  HGB 14.4 12.0  HCT 44.6 35.9*  PLT 266 307   BMET  Recent Labs  03/01/13 0605 03/02/13 0114  NA 139 139  K 4.3 3.9  CL 101 103  CO2 24 26  GLUCOSE 99 99  BUN 15 15  CREATININE 0.67 0.72  CALCIUM 9.7 9.3    Studies/Results: No results found.  Assessment/Plan:   LOS: 3 days  Kyphoplasty this afternoon for L1 compression fracture.   Georgiann Cocker 03/02/2013, 7:48 AM

## 2013-03-02 NOTE — Progress Notes (Signed)
Patient ID: [name]  female  RUE:[id number]    DOB: 1931/09/03    DOA: 02/27/2013  PCP: Hoyle Sauer, MD  Assessment/Plan: Principal Problem:   Fracture of L1 vertebra with acute back pain - Continue pain control, bedrest, kyphoplasty today  Active Problems: Altered mental status with confusion and hallucinations:  - Somewhat better today, patient appropriately conversing, UA showed possible UTI yesterday hence was started on Rocephin however we'll follow urine culture    HTN (hypertension): Stable, continue home medication    RBBB: Stable  Chronic low back pain: Continue pain control  Hypothyroidism: Continue Synthroid  DVT Prophylaxis:  Code Status:  Disposition:   Subjective: Pleasant today, pain better controlled, looking forward to kyphoplasty  Objective: Weight change:   Intake/Output Summary (Last 24 hours) at 03/02/13 1353 Last data filed at 03/02/13 1021  Gross per 24 hour  Intake      3 ml  Output    300 ml  Net   -297 ml   Blood pressure 156/54, pulse 82, temperature 98.2 F (36.8 C), temperature source Oral, resp. rate 18, SpO2 98.00%.  Physical Exam: General: Alert and awake, oriented x3, not in any acute distress. CVS: S1-S2 clear, no murmur rubs or gallops Chest: CTAB Abdomen: soft nontender, nondistended, normal bowel sounds,  Extremities: no c/c/e bilaterally   Lab Results: Basic Metabolic Panel:  Recent Labs Lab 03/01/13 0605 03/02/13 0114  NA 139 139  K 4.3 3.9  CL 101 103  CO2 24 26  GLUCOSE 99 99  BUN 15 15  CREATININE 0.67 0.72  CALCIUM 9.7 9.3  MG 2.1  --    Liver Function Tests:  Recent Labs Lab 03/02/13 0114  AST 13  ALT 8  ALKPHOS 79  BILITOT 0.3  PROT 6.5  ALBUMIN 3.1*   No results found for this basename: LIPASE, AMYLASE,  in the last 168 hours No results found for this basename: AMMONIA,  in the last 168 hours CBC:  Recent Labs Lab 02/27/13 1832 03/01/13 0605 03/02/13 0114  WBC 10.9* 6.7  8.3  NEUTROABS 8.4*  --   --   HGB 13.8 14.4 12.0  HCT 40.4 44.6 35.9*  MCV 87.4 88.7 85.5  PLT 332 266 307   Cardiac Enzymes: No results found for this basename: CKTOTAL, CKMB, CKMBINDEX, TROPONINI,  in the last 168 hours BNP: No components found with this basename: POCBNP,  CBG: No results found for this basename: GLUCAP,  in the last 168 hours   Micro Results: Recent Results (from the past 240 hour(s))  SURGICAL PCR SCREEN     Status: None   Collection Time    03/02/13  5:59 AM      Result Value Range Status   MRSA, PCR NEGATIVE  NEGATIVE Final   Staphylococcus aureus NEGATIVE  NEGATIVE Final   Comment:            The Xpert SA Assay (FDA     approved for NASAL specimens     in patients over 5 years of age),     is one component of     a comprehensive surveillance     program.  Test performance has     been validated by [employer] for patients greater     than or equal to 66 year old.     It is not intended     to diagnose infection nor to     guide or monitor treatment.  Studies/Results: Mr Thoracic Spine Wo Contrast  02/26/2013  *RADIOLOGY REPORT*  Clinical Data:  Compression fracture.  Back pain 2 weeks  MRI THORACIC AND LUMBAR SPINE WITHOUT CONTRAST  Technique:  Multiplanar and multiecho pulse sequences of the thoracic and lumbar spine were obtained without intravenous contrast.  Comparison:  None  MRI THORACIC SPINE  Findings: Mild chronic compression fracture T3 and T11 without bone marrow edema.  Severe burst fracture of L1 which is described in a separate lumbar MRI report.  No mass lesion in the thoracic spine. There is no cord compression.  Spinal cord signal is normal.  Disc degeneration and spurring at T10-11 related to the chronic fracture of T11.  There is mild posterior element hypertrophy and mild spinal stenosis at this level.  No acute disc protrusion or cord deformity in the thoracic spine.  IMPRESSION:  Chronic compression fractures T3 and T11.  No  acute fracture or mass in the thoracic spine.  Burst fracture L1, see below report  MRI LUMBAR SPINE  Findings: Burst fracture L1 with comminuted vertebral body and retropulsion of bone into the spinal canal.  No compression of the conus medullaris.  There is mild spinal stenosis at this level. There is diffuse bone marrow edema in the vertebral body consistent with an acute fracture.  This is most likely a benign fracture.  Chronic fracture T11.  No other lumbar fracture.  L1-2:  Mild disc degeneration  L2-3:  Negative  L3-4:  Mild disc and facet degeneration without stenosis  L4-5:  3 mm anterior slip.  Disc and facet degeneration with moderate spinal stenosis.  There is advanced facet hypertrophy bilaterally causing mild foraminal narrowing.  L5-S1:  Mild disc and facet degeneration.  Mild anterior slip.  IMPRESSION:  Acute burst fracture of L1 with retropulsion of bone into the spinal canal and mild spinal stenosis.  No compression of the conus medullaris.  This appears to be an acute fracture.  Moderate spinal stenosis L4-5.   Original Report Authenticated By: Janeece Riggers, M.D.    Mr Lumbar Spine Wo Contrast  02/26/2013  *RADIOLOGY REPORT*  Clinical Data:  Compression fracture.  Back pain 2 weeks  MRI THORACIC AND LUMBAR SPINE WITHOUT CONTRAST  Technique:  Multiplanar and multiecho pulse sequences of the thoracic and lumbar spine were obtained without intravenous contrast.  Comparison:  None  MRI THORACIC SPINE  Findings: Mild chronic compression fracture T3 and T11 without bone marrow edema.  Severe burst fracture of L1 which is described in a separate lumbar MRI report.  No mass lesion in the thoracic spine. There is no cord compression.  Spinal cord signal is normal.  Disc degeneration and spurring at T10-11 related to the chronic fracture of T11.  There is mild posterior element hypertrophy and mild spinal stenosis at this level.  No acute disc protrusion or cord deformity in the thoracic spine.   IMPRESSION:  Chronic compression fractures T3 and T11.  No acute fracture or mass in the thoracic spine.  Burst fracture L1, see below report  MRI LUMBAR SPINE  Findings: Burst fracture L1 with comminuted vertebral body and retropulsion of bone into the spinal canal.  No compression of the conus medullaris.  There is mild spinal stenosis at this level. There is diffuse bone marrow edema in the vertebral body consistent with an acute fracture.  This is most likely a benign fracture.  Chronic fracture T11.  No other lumbar fracture.  L1-2:  Mild disc degeneration  L2-3:  Negative  L3-4:  Mild disc and facet degeneration without stenosis  L4-5:  3 mm anterior slip.  Disc and facet degeneration with moderate spinal stenosis.  There is advanced facet hypertrophy bilaterally causing mild foraminal narrowing.  L5-S1:  Mild disc and facet degeneration.  Mild anterior slip.  IMPRESSION:  Acute burst fracture of L1 with retropulsion of bone into the spinal canal and mild spinal stenosis.  No compression of the conus medullaris.  This appears to be an acute fracture.  Moderate spinal stenosis L4-5.   Original Report Authenticated By: Janeece Riggers, M.D.     Medications: Scheduled Meds: . acetaminophen  500 mg Oral TID  . amLODipine  5 mg Oral Daily  .  ceFAZolin (ANCEF) IV  2 g Intravenous On Call to OR  . cefTRIAXone (ROCEPHIN)  IV  1 g Intravenous Q24H  . docusate sodium  100 mg Oral BID  . hydrochlorothiazide  25 mg Oral Daily  . irbesartan  150 mg Oral Daily  . levothyroxine  200 mcg Oral QAC breakfast  . metoprolol tartrate  25 mg Oral BID  . niacin  500 mg Oral QHS  . polyethylene glycol  17 g Oral Daily  . senna  1 tablet Oral BID  . sodium chloride  3 mL Intravenous Q12H      LOS: 3 days   RAI,RIPUDEEP M.D. [facility] 03/02/2013, 1:53 PM Pager: [phone]  If 7PM-7AM, please contact night-coverage [email or web address] Password [email or web address]

## 2013-03-02 NOTE — Transfer of Care (Signed)
Immediate Anesthesia Transfer of Care Note  Patient: [name]  Procedure(s) Performed: Procedure(s) with comments: KYPHOPLASTY (N/A) - Lumbar One Kyphoplasty  Patient Location: PACU  Anesthesia Type:General  Level of Consciousness: awake, alert , oriented and patient cooperative  Airway & Oxygen Therapy: Patient Spontanous Breathing and Patient connected to nasal cannula oxygen  Post-op Assessment: Report given to PACU RN, Post -op Vital signs reviewed and stable and Patient moving all extremities  Post vital signs: Reviewed and stable  Complications: No apparent anesthesia complications

## 2013-03-03 MED ORDER — WHITE PETROLATUM GEL
Status: AC
  Administered 2013-03-03: 11:00:00
  Filled 2013-03-03: qty 5

## 2013-03-03 NOTE — Progress Notes (Signed)
PT is evaluating patient for CIR. PR MD, if declined, patient can d/c home. Clinical Social Worker will sign off for now as social work intervention is no longer needed. Please consult [place] again if new need arises.   Sabino Niemann, MSW [phone]

## 2013-03-03 NOTE — Evaluation (Signed)
Physical Therapy Re-Evaluation Patient Details Name: [name] MRN: [id number] DOB: 09-12-1931 Today's Date: 03/03/2013 Time: 4098-1191 PT Time Calculation (min): 38 min  PT Assessment / Plan / Recommendation Clinical Impression  Pt is a 79 y/o female s/p Kyphoplasty for L1 burst fx.  Pt lives with her husband who can provide no more than surpervision assistance.  Daughter lives nearby and can assist pt PRN.  Pt did fairly well with PT today.  Some question of pt safety at home with spouse only. Acute PT will continue to progress pt mobility for safe return to home.   Pt could benefit from short term in-patient rehab consideration depending on progress    PT Assessment  Patient needs continued PT services    Follow Up Recommendations  CIR;Supervision/Assistance - 24 hour    Does the patient have the potential to tolerate intense rehabilitation      Barriers to Discharge Decreased caregiver support Spouse in poor health unable to provide manual assistance.      Equipment Recommendations  None recommended by PT    Recommendations for Other Services     Frequency Min 3X/week    Precautions / Restrictions Precautions Precautions: Fall Precaution Comments: Left elbow replacement in 93, no lifting or heavy pushing Restrictions Weight Bearing Restrictions: No   Pertinent Vitals/Pain 5/10 pain in low back      Mobility  Bed Mobility Bed Mobility: Not assessed Rolling Right: 4: Min assist Details for Bed Mobility Assistance: mod instructional cueing for technique when returning into bed. Transfers Transfers: Sit to Stand;Stand to [employer] Transfers Sit to Stand: 4: Min assist;From chair/3-in-1 Stand to Sit: 4: Min guard Details for Transfer Assistance: VCs for safe hand placement Ambulation/Gait Ambulation/Gait Assistance: 4: Min guard Ambulation Distance (Feet): 80 Feet Assistive device: Rolling walker Gait Pattern: Within Functional Limits    Exercises      PT Diagnosis: Difficulty walking;Acute pain  PT Problem List: Decreased activity tolerance;Decreased balance;Decreased mobility;Decreased knowledge of use of DME;Pain PT Treatment Interventions: DME instruction;Gait training;Functional mobility training;Therapeutic activities;Therapeutic exercise;Balance training;Neuromuscular re-education;Patient/family education;Stair training   PT Goals Acute Rehab PT Goals PT Goal Formulation: With patient/family Time For Goal Achievement: 03/07/13 Potential to Achieve Goals: Good Pt will Roll Supine to Right Side: with supervision Pt will Roll Supine to Left Side: with supervision Pt will go Supine/Side to Sit: with supervision;with HOB 0 degrees Pt will go Sit to Supine/Side: with supervision;with HOB 0 degrees Pt will go Sit to Stand: with supervision;with upper extremity assist PT Goal: Sit to Stand - Progress: Progressing toward goal Pt will go Stand to Sit: with supervision;with upper extremity assist PT Goal: Stand to Sit - Progress: Progressing toward goal Pt will Ambulate: 51 - 150 feet;with supervision;with rolling walker PT Goal: Ambulate - Progress: Progressing toward goal Pt will Go Up / Down Stairs: 3-5 stairs;with supervision;with rail(s) PT Goal: Up/Down Stairs - Progress: Not met  Visit Information  Last PT Received On: 03/03/13 Assistance Needed: +1    Subjective Data  Subjective: Agree to participate in PT Patient Stated Goal: to go home   Prior Functioning  Home Living Lives With: Spouse Available Help at Discharge: Family;Available 24 hours/day;Other (Comment) (Daughter available PRN) Type of Home: House Home Access: Stairs to enter [employer] of Steps: 5 Entrance Stairs-Rails: Left;Right Home Layout: One level;Full bath on main level;Able to live on main level with bedroom/bathroom Bathroom Shower/Tub: Tub/shower unit;Curtain Bathroom Toilet: Standard Bathroom Accessibility: Yes How Accessible:  Accessible via walker Home Adaptive Equipment:  Bedside commode/3-in-1;Walker - rolling;Straight cane;Grab bars in shower;Grab bars around toilet;Hand-held shower hose Prior Function Level of Independence: Independent Able to Take Stairs?: Yes Driving: Yes Vocation: 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 Dominant Hand: Right    Cognition  Cognition Overall Cognitive Status: Appears within functional limits for tasks assessed/performed Arousal/Alertness: Awake/alert Orientation Level: Appears intact for tasks assessed Behavior During Session: [facility] for tasks performed    Extremity/Trunk Assessment Right Upper Extremity Assessment RUE ROM/Strength/Tone: WFL for tasks assessed RUE Sensation: WFL - Light Touch RUE Coordination: WFL - gross/fine motor Left Upper Extremity Assessment LUE ROM/Strength/Tone: Deficits LUE ROM/Strength/Tone Deficits: shoulder flexion AROM 0-90 degrees, full AROM elbow flexion present but pt with history of elbow replacement and reporting some slight pain at the elbow so strength not assessed in this area.  Pt demonstrates grip strength at 3+/5. Right Lower Extremity Assessment RLE ROM/Strength/Tone: [facility] for tasks assessed Left Lower Extremity Assessment LLE ROM/Strength/Tone: [facility] for tasks assessed Trunk Assessment Trunk Assessment: Kyphotic   Balance Balance Balance Assessed: No Dynamic Standing Balance Dynamic Standing - Balance Support: No upper extremity supported Dynamic Standing - Level of Assistance: 4: Min assist (LOB posteriorly in standing at the sink.)  End of Session PT - End of Session Equipment Utilized During Treatment: Gait belt Activity Tolerance: Patient tolerated treatment well Patient left: in chair;with call bell/phone within reach;with family/visitor present Nurse Communication: Mobility status  GP     GREEN,PETER 03/03/2013, 5:21 PM Peter L. Green DPT [phone]

## 2013-03-03 NOTE — Progress Notes (Signed)
Subjective: Patient reports "I slept well all night after just one pain pill."  Objective: Vital signs in last 24 hours: Temp:  [97.4 F (36.3 C)-98.6 F (37 C)] 98.1 F (36.7 C) (03/14 0703) Pulse Rate:  [55-85] 60 (03/14 0703) Resp:  [18-20] 18 (03/14 0703) BP: (116-156)/(45-72) 116/64 mmHg (03/14 0839) SpO2:  [97 %-99 %] 98 % (03/14 0703)  Intake/Output from previous day: 03/13 0701 - 03/14 0700 In: 1291.8 [I.V.:1241.8; IV Piggyback:50] Out: 310 [Urine:300; Blood:10] Intake/Output this shift:    Alert, conversant. MAEW. Good strength BLE. Back pain MUCH improved per pt. - only "sore" at present. Kypho site with 2x2. No swelling or tenderness.   Lab Results:  Recent Labs  03/01/13 0605 03/02/13 0114  WBC 6.7 8.3  HGB 14.4 12.0  HCT 44.6 35.9*  PLT 266 307   BMET  Recent Labs  03/01/13 0605 03/02/13 0114  NA 139 139  K 4.3 3.9  CL 101 103  CO2 24 26  GLUCOSE 99 99  BUN 15 15  CREATININE 0.67 0.72  CALCIUM 9.7 9.3    Studies/Results: Dg Lumbar Spine 2-3 Views  03/02/2013  *RADIOLOGY REPORT*  Clinical Data: L1 kyphoplasty  DG C-ARM 1-60 MIN,LUMBAR SPINE - 2-3 VIEW  Technique: C-arm images were obtained  Comparison:  MRI 02/26/2013  Findings: Burst fracture of L1 has been treated with cement vertebral augmentation.  There is cement distributed bilaterally. Cement extends into the L1-2 disc space.  Limited bony detail  IMPRESSION: Cement  kyphoplasty of L1 fracture.   Original Report Authenticated By: Janeece Riggers, M.D.    Dg C-arm 1-60 Min  03/02/2013  *RADIOLOGY REPORT*  Clinical Data: L1 kyphoplasty  DG C-ARM 1-60 MIN,LUMBAR SPINE - 2-3 VIEW  Technique: C-arm images were obtained  Comparison:  MRI 02/26/2013  Findings: Burst fracture of L1 has been treated with cement vertebral augmentation.  There is cement distributed bilaterally. Cement extends into the L1-2 disc space.  Limited bony detail  IMPRESSION: Cement  kyphoplasty of L1 fracture.   Original Report  Authenticated By: Janeece Riggers, M.D.     Assessment/Plan: Improved    LOS: 4 days  Pt notes Hospitalist mentioned PT eval for possible inpt therapy later today.   From NS perspective, ok to d/c home if PT/CIR declined.  May resume home meds for mild pain prn or Oxycodone 5mg  prn (doesn't tolerate Hydrocodone).  Pt understands plan to call office for 3-4 week f/u with Dr. Venetia Maxon in office.   [name] 03/03/2013, 9:47 AM    Patient doing much better in terms of back pain following kyphoplasty.  Mobilize and PT per Hospitalist service.  OK to D/C home per Hospitalists.  F/U with me in office in 3 weeks with AP/Lat L spine X rays.

## 2013-03-03 NOTE — Evaluation (Signed)
Occupational Therapy Evaluation Patient Details Name: [name] MRN: [id number] DOB: Jul 10, 1931 Today's Date: 03/03/2013 Time: 4098-1191 OT Time Calculation (min): 36 min  OT Assessment / Plan / Recommendation Clinical Impression  Pt is a 79 yr old female admitted with pathological L1 fracture.  She underwent kyphoplasty 3/13 for repair.  Now presents with some back pain but also decreased overall strength and mobility.  Will benefit from acute care OT to help increase safety and performance of selfcare tasks at a safer level for home.  May benefit from CIR level short term secondary to her husband not being able to provide any physical assist    OT Assessment  Patient needs continued OT Services    Follow Up Recommendations  CIR    Barriers to Discharge Decreased caregiver support    Equipment Recommendations  Tub/shower bench    Recommendations for Other Services Rehab consult  Frequency  Min 2X/week    Precautions / Restrictions Precautions Precautions: Fall Precaution Comments: Left elbow replacement in 93, no lifting or heavy pushing Restrictions Weight Bearing Restrictions: No   Pertinent Vitals/Pain Vitals stable    ADL  Eating/Feeding: Simulated;Independent Where Assessed - Eating/Feeding: Chair Grooming: Performed;Minimal assistance Where Assessed - Grooming: Supported standing Upper Body Bathing: Simulated;Set up Where Assessed - Upper Body Bathing: Unsupported sitting Lower Body Bathing: Simulated;Minimal assistance Where Assessed - Lower Body Bathing: Supported sit to stand Upper Body Dressing: Simulated;Minimal assistance Where Assessed - Upper Body Dressing: Unsupported sitting Lower Body Dressing: Simulated Where Assessed - Lower Body Dressing: Supported sit to Pharmacist, hospital: Moderate assistance Toilet Transfer Method: Other (comment) (ambulate with RW) Toilet Transfer Equipment: Comfort height toilet Toileting - Clothing Manipulation and  Hygiene: Performed;Moderate assistance Where Assessed - Toileting Clothing Manipulation and Hygiene: Sit to stand from 3-in-1 or toilet Tub/Shower Transfer Method: Not assessed Equipment Used: Rolling walker Transfers/Ambulation Related to ADLs: Pt min assist level for mobility and sit to stand from toilet and from bedside chair. ADL Comments: Pt with decreased effieciency when attempting to donn the right sock.  May benefit from AE for LB selfcare to help decrease back pain.  Pt does not have any physical assist just supervision and feel at this time she is a high fall risk.  May benefit from short term rehab at [facility] level to reach supervision level.    OT Diagnosis: Generalized weakness;Acute pain  OT Problem List: Decreased strength;Impaired balance (sitting and/or standing);Pain OT Treatment Interventions: Self-care/ADL training;Therapeutic activities;Patient/family education;DME and/or AE instruction;Balance training   OT Goals Acute Rehab OT Goals OT Goal Formulation: With patient Time For Goal Achievement: 03/10/13 ADL Goals Pt Will Perform Grooming: Standing at sink;Supported ADL Goal: Grooming - Progress: Goal set today Pt Will Perform Lower Body Bathing: with modified independence;Sit to stand from bed;with adaptive equipment;Supported ADL Goal: Lower Body Bathing - Progress: Goal set today Pt Will Perform Lower Body Dressing: with modified independence;Sit to stand from bed;with adaptive equipment ADL Goal: Lower Body Dressing - Progress: Goal set today Pt Will Transfer to Toilet: with modified independence;with DME;3-in-1;Maintaining back safety precautions ADL Goal: Toilet Transfer - Progress: Goal set today Pt Will Perform Toileting - Hygiene: with modified independence;Sit to stand from 3-in-1/toilet ADL Goal: Toileting - Hygiene - Progress: Goal set today Pt Will Perform Tub/Shower Transfer: with supervision;with DME;Transfer tub bench ADL Goal: Tub/Shower Transfer -  Progress: Goal set today  Visit Information  Last OT Received On: 03/03/13 Assistance Needed: +1    Subjective Data  Subjective: I had my elbow  replaced by Dr. Butler Denmark in 93. Patient Stated Goal: Did not state   Prior Functioning     Home Living Lives With: Spouse Available Help at Discharge: Family;Available 24 hours/day;Other (Comment) (daughter available as needed) Type of Home: House Home Access: Stairs to enter [employer] of Steps: 5 Entrance Stairs-Rails: Left;Right Home Layout: One level;Full bath on main level;Able to live on main level with bedroom/bathroom Bathroom Shower/Tub: Tub/shower unit;C[employer]: Standard Bathroom Accessibility: Yes How Accessible: Accessible via walker Home Adaptive Equipment: Bedside commode/3-in-1;Walker - rolling;Straight cane;Grab bars in shower;Grab bars around toilet;Hand-held shower hose Prior Function Level of Independence: Independent Able to Take Stairs?: Yes Driving: Yes Vocation: 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 Dominant Hand: Right         Vision/Perception Vision - History Baseline Vision: No visual deficits Patient Visual Report: No change from baseline Vision - Assessment Eye Alignment: Within Functional Limits Vision Assessment: Vision not tested Perception Perception: Within Functional Limits Praxis Praxis: Intact   Cognition  Cognition Overall Cognitive Status: Appears within functional limits for tasks assessed/performed Arousal/Alertness: Awake/alert Orientation Level: Appears intact for tasks assessed Behavior During Session: [facility] for tasks performed    Extremity/Trunk Assessment Right Upper Extremity Assessment RUE ROM/Strength/Tone: WFL for tasks assessed RUE Sensation: WFL - Light Touch RUE Coordination: WFL - gross/fine motor Left Upper Extremity Assessment LUE ROM/Strength/Tone: Deficits LUE ROM/Strength/Tone Deficits: shoulder flexion AROM 0-90  degrees, full AROM elbow flexion present but pt with history of elbow replacement and reporting some slight pain at the elbow so strength not assessed in this area.  Pt demonstrates grip strength at 3+/5. Trunk Assessment Trunk Assessment: Kyphotic     Mobility Bed Mobility Bed Mobility: Sit to Supine Rolling Right: 4: Min assist Details for Bed Mobility Assistance: mod instructional cueing for technique when returning into bed. Transfers Transfers: Sit to Stand Sit to Stand: 4: Min assist;With upper extremity assist;From chair/3-in-1 Stand to Sit: 4: Min assist;With upper extremity assist;To toilet        Balance Balance Balance Assessed: Yes Dynamic Standing Balance Dynamic Standing - Balance Support: No upper extremity supported Dynamic Standing - Level of Assistance: 4: Min assist (LOB posteriorly in standing at the sink.)   End of Session OT - End of Session Equipment Utilized During Treatment: Gait belt Activity Tolerance: Patient tolerated treatment well Patient left: in bed;with family/visitor present Nurse Communication: Mobility status     MCGUIRE,JAMES OTR/L Pager number [phone] 03/03/2013, 4:59 PM

## 2013-03-03 NOTE — Progress Notes (Addendum)
Patient ID: [name]  female  ZOX:[id number]    DOB: [birth year]    DOA: 02/27/2013  PCP: Hoyle Sauer, MD  Assessment/Plan: Principal Problem:   Fracture of L1 vertebra with acute back pain - Continue pain control, s/p kyphoplasty on 3/13 - PT eval today  Active Problems: Altered mental status with confusion and hallucinations: Significantly improved, - follow urine culture, continue Rocephin    HTN (hypertension): Stable, continue home medication    RBBB: Stable  Chronic low back pain: Continue pain control, currently controlled  Hypothyroidism: Continue Synthroid  DVT Prophylaxis:  Code Status:  Disposition: PT evaluation today, will await disposition after that   Subjective: Eating breakfast, pain much better controlled, slept well last night  Objective: Weight change:   Intake/Output Summary (Last 24 hours) at 03/03/13 1248 Last data filed at 03/03/13 0600  Gross per 24 hour  Intake 1288.75 ml  Output     10 ml  Net 1278.75 ml   Blood pressure 119/50, pulse 60, temperature 98.1 F (36.7 C), temperature source Oral, resp. rate 18, SpO2 98.00%.  Physical Exam: General: Alert and awake, oriented x3, NAD CVS: S1-S2 clear Chest: CTAB Abdomen: soft, NT, ND, NBS Extremities: no c/c/e bilaterally   Lab Results: Basic Metabolic Panel:  Recent Labs Lab 03/01/13 0605 03/02/13 0114  NA 139 139  K 4.3 3.9  CL 101 103  CO2 24 26  GLUCOSE 99 99  BUN 15 15  CREATININE 0.67 0.72  CALCIUM 9.7 9.3  MG 2.1  --    Liver Function Tests:  Recent Labs Lab 03/02/13 0114  AST 13  ALT 8  ALKPHOS 79  BILITOT 0.3  PROT 6.5  ALBUMIN 3.1*   CBC:  Recent Labs Lab 02/27/13 1832 03/01/13 0605 03/02/13 0114  WBC 10.9* 6.7 8.3  NEUTROABS 8.4*  --   --   HGB 13.8 14.4 12.0  HCT 40.4 44.6 35.9*  MCV 87.4 88.7 85.5  PLT 332 266 307     Micro Results: Recent Results (from the past 240 hour(s))  URINE CULTURE     Status: None   Collection Time     03/01/13 12:00 PM      Result Value Range Status   Specimen Description URINE, CLEAN CATCH   Final   Special Requests NONE   Final   Culture  Setup Time 03/01/2013 13:02   Final   Colony Count PENDING   Incomplete   Culture Culture reincubated for better growth   Final   Report Status PENDING   Incomplete  SURGICAL PCR SCREEN     Status: None   Collection Time    03/02/13  5:59 AM      Result Value Range Status   MRSA, PCR NEGATIVE  NEGATIVE Final   Staphylococcus aureus NEGATIVE  NEGATIVE Final   Comment:            The Xpert SA Assay (FDA     approved for NASAL specimens     in patients over 73 years of age),     is one component of     a comprehensive surveillance     program.  Test performance has     been validated by [employer] for patients greater     than or equal to 49 year old.     It is not intended     to diagnose infection nor to     guide or monitor treatment.  Studies/Results: Mr Thoracic Spine Wo Contrast  02/26/2013  *RADIOLOGY REPORT*  Clinical Data:  Compression fracture.  Back pain 2 weeks  MRI THORACIC AND LUMBAR SPINE WITHOUT CONTRAST  Technique:  Multiplanar and multiecho pulse sequences of the thoracic and lumbar spine were obtained without intravenous contrast.  Comparison:  None  MRI THORACIC SPINE  Findings: Mild chronic compression fracture T3 and T11 without bone marrow edema.  Severe burst fracture of L1 which is described in a separate lumbar MRI report.  No mass lesion in the thoracic spine. There is no cord compression.  Spinal cord signal is normal.  Disc degeneration and spurring at T10-11 related to the chronic fracture of T11.  There is mild posterior element hypertrophy and mild spinal stenosis at this level.  No acute disc protrusion or cord deformity in the thoracic spine.  IMPRESSION:  Chronic compression fractures T3 and T11.  No acute fracture or mass in the thoracic spine.  Burst fracture L1, see below report  MRI LUMBAR SPINE   Findings: Burst fracture L1 with comminuted vertebral body and retropulsion of bone into the spinal canal.  No compression of the conus medullaris.  There is mild spinal stenosis at this level. There is diffuse bone marrow edema in the vertebral body consistent with an acute fracture.  This is most likely a benign fracture.  Chronic fracture T11.  No other lumbar fracture.  L1-2:  Mild disc degeneration  L2-3:  Negative  L3-4:  Mild disc and facet degeneration without stenosis  L4-5:  3 mm anterior slip.  Disc and facet degeneration with moderate spinal stenosis.  There is advanced facet hypertrophy bilaterally causing mild foraminal narrowing.  L5-S1:  Mild disc and facet degeneration.  Mild anterior slip.  IMPRESSION:  Acute burst fracture of L1 with retropulsion of bone into the spinal canal and mild spinal stenosis.  No compression of the conus medullaris.  This appears to be an acute fracture.  Moderate spinal stenosis L4-5.   Original Report Authenticated By: Janeece Riggers, M.D.    Mr Lumbar Spine Wo Contrast  02/26/2013  *RADIOLOGY REPORT*  Clinical Data:  Compression fracture.  Back pain 2 weeks  MRI THORACIC AND LUMBAR SPINE WITHOUT CONTRAST  Technique:  Multiplanar and multiecho pulse sequences of the thoracic and lumbar spine were obtained without intravenous contrast.  Comparison:  None  MRI THORACIC SPINE  Findings: Mild chronic compression fracture T3 and T11 without bone marrow edema.  Severe burst fracture of L1 which is described in a separate lumbar MRI report.  No mass lesion in the thoracic spine. There is no cord compression.  Spinal cord signal is normal.  Disc degeneration and spurring at T10-11 related to the chronic fracture of T11.  There is mild posterior element hypertrophy and mild spinal stenosis at this level.  No acute disc protrusion or cord deformity in the thoracic spine.  IMPRESSION:  Chronic compression fractures T3 and T11.  No acute fracture or mass in the thoracic spine.   Burst fracture L1, see below report  MRI LUMBAR SPINE  Findings: Burst fracture L1 with comminuted vertebral body and retropulsion of bone into the spinal canal.  No compression of the conus medullaris.  There is mild spinal stenosis at this level. There is diffuse bone marrow edema in the vertebral body consistent with an acute fracture.  This is most likely a benign fracture.  Chronic fracture T11.  No other lumbar fracture.  L1-2:  Mild disc degeneration  L2-3:  Negative  L3-4:  Mild disc and facet degeneration without stenosis  L4-5:  3 mm anterior slip.  Disc and facet degeneration with moderate spinal stenosis.  There is advanced facet hypertrophy bilaterally causing mild foraminal narrowing.  L5-S1:  Mild disc and facet degeneration.  Mild anterior slip.  IMPRESSION:  Acute burst fracture of L1 with retropulsion of bone into the spinal canal and mild spinal stenosis.  No compression of the conus medullaris.  This appears to be an acute fracture.  Moderate spinal stenosis L4-5.   Original Report Authenticated By: Janeece Riggers, M.D.     Medications: Scheduled Meds: . acetaminophen  500 mg Oral TID  . amLODipine  5 mg Oral Daily  . cefTRIAXone (ROCEPHIN)  IV  1 g Intravenous Q24H  . docusate sodium  100 mg Oral BID  . hydrochlorothiazide  25 mg Oral Daily  . irbesartan  150 mg Oral Daily  . levothyroxine  200 mcg Oral QAC breakfast  . metoprolol tartrate  25 mg Oral BID  . niacin  500 mg Oral QHS  . pantoprazole (PROTONIX) IV  40 mg Intravenous QHS  . polyethylene glycol  17 g Oral Daily  . senna  1 tablet Oral BID  . sodium chloride  3 mL Intravenous Q12H  . sodium chloride  3 mL Intravenous Q12H      LOS: 4 days   RAI,RIPUDEEP M.D. [facility] 03/03/2013, 12:48 PM Pager: [phone]  If 7PM-7AM, please contact night-coverage [email or web address] Password [email or web address]

## 2013-03-04 ENCOUNTER — Encounter (HOSPITAL_COMMUNITY): Payer: Self-pay | Admitting: Neurosurgery

## 2013-03-04 LAB — URINE CULTURE: Colony Count: >=100000

## 2013-03-04 MED ORDER — BISACODYL 10 MG RE SUPP
10.0000 mg | Freq: Once | RECTAL | Status: AC
  Administered 2013-03-04: 10 mg via RECTAL

## 2013-03-04 MED ORDER — PANTOPRAZOLE SODIUM 40 MG PO TBEC
40.0000 mg | DELAYED_RELEASE_TABLET | Freq: Every day | ORAL | Status: DC
  Administered 2013-03-04 – 2013-03-06 (×3): 40 mg via ORAL
  Filled 2013-03-04 (×3): qty 1

## 2013-03-04 MED ORDER — MAGNESIUM HYDROXIDE 400 MG/5ML PO SUSP
15.0000 mL | Freq: Once | ORAL | Status: AC
  Administered 2013-03-04: 15 mL via ORAL
  Filled 2013-03-04: qty 30

## 2013-03-04 NOTE — Progress Notes (Signed)
Physical Therapy Treatment Patient Details Name: [name] MRN: [id number] DOB: [birth year] Today's Date: 03/04/2013 Time: 4098-1191 PT Time Calculation (min): 25 min  PT Assessment / Plan / Recommendation Comments on Treatment Session  Pt progressing with mobility at this date.   Pt still not yet at (S) level which she needs to be to safely d/c home with husband.   Cont to recommend CIR.      Follow Up Recommendations  CIR;Supervision/Assistance - 24 hour     Does the patient have the potential to tolerate intense rehabilitation     Barriers to Discharge        Equipment Recommendations  None recommended by PT    Recommendations for Other Services    Frequency Min 3X/week   Plan Discharge plan remains appropriate    Precautions / Restrictions Precautions Precautions: Fall Precaution Comments: Left elbow replacement in 93, no lifting or heavy pushing Restrictions Weight Bearing Restrictions: No   Pertinent Vitals/Pain 7/10 back.      Mobility  Bed Mobility Bed Mobility: Not assessed Transfers Transfers: Sit to Stand;Stand to Sit Sit to Stand: 4: Min guard;With upper extremity assist;With armrests;From chair/3-in-1 Stand to Sit: 4: Min guard;With upper extremity assist;With armrests;To chair/3-in-1 Details for Transfer Assistance: cues for safest hand placement Ambulation/Gait Ambulation/Gait Assistance: 4: Min guard Ambulation Distance (Feet): 100 Feet Assistive device: Rolling walker Ambulation/Gait Assistance Details: cues for tall posture, body positioning inside RW Gait Pattern: Decreased stride length General Gait Details: Pt c/o pain & weakness but reports distance limited more so by weakness rather than pain  Stairs: No Wheelchair Mobility Wheelchair Mobility: No    Exercises     PT Diagnosis:    PT Problem List:   PT Treatment Interventions:     PT Goals Acute Rehab PT Goals Time For Goal Achievement: 03/07/13 Potential to Achieve Goals:  Good Pt will Roll Supine to Right Side: with supervision Pt will Roll Supine to Left Side: with supervision Pt will go Supine/Side to Sit: with supervision;with HOB 0 degrees Pt will go Sit to Supine/Side: with supervision;with HOB 0 degrees Pt will go Sit to Stand: with supervision;with upper extremity assist PT Goal: Sit to Stand - Progress: Progressing toward goal Pt will go Stand to Sit: with supervision;with upper extremity assist PT Goal: Stand to Sit - Progress: Progressing toward goal Pt will Ambulate: 51 - 150 feet;with supervision;with rolling walker PT Goal: Ambulate - Progress: Progressing toward goal Pt will Go Up / Down Stairs: 3-5 stairs;with supervision;with rail(s)  Visit Information  Last PT Received On: 03/04/13 Assistance Needed: +1    Subjective Data  Patient Stated Goal: to go home   Cognition  Cognition Overall Cognitive Status: Appears within functional limits for tasks assessed/performed Arousal/Alertness: Awake/alert Orientation Level: Appears intact for tasks assessed Behavior During Session: [facility] for tasks performed    Balance  Balance Balance Assessed: No  End of Session PT - End of Session Equipment Utilized During Treatment: Gait belt Activity Tolerance: Patient tolerated treatment well Patient left: in chair;with call bell/phone within reach;with family/visitor present Nurse Communication: Mobility status      Verdell Face, [place] [phone] 03/04/2013

## 2013-03-04 NOTE — Progress Notes (Signed)
Patient ID: [name]  female  ZOX:[id number]    DOB: [birth year]    DOA: 02/27/2013  PCP: Hoyle Sauer, MD  Assessment/Plan: Principal Problem:   Fracture of L1 vertebra with acute back pain - Continue pain control, s/p kyphoplasty on 3/13 - PT eval recommending CIR or 24 hr supervision - I called inpatient rehabilitation, and nobody is available for her CIR evaluation today  Active Problems: Altered mental status with confusion and hallucinations: Significantly improved, - Urine culture is still pending, continue Rocephin, patient is on day 3, DC Rocephin after today's dose    HTN (hypertension): Stable, continue home medication    RBBB: Stable  Chronic low back pain: Continue pain control, currently controlled  Hypothyroidism: Continue Synthroid  DVT Prophylaxis:  Code Status:  Disposition: Discussed with the patient and her daughter-in-law at bedside about the disposition, she wants to take her time and discuss with her family about in-patient rehab (dispo will be likely on Monday as they are not available over weekend for evaluation and if beds are available on Monday) or home health PT/OT/HHA and RN follow-up can be arranged. RN reported that her daughter had stated that she lives in [place] and cannot provide 24 hr coverage.   Subjective: Waking up at the time of my encounter this morning, states pain is better controlled  Objective: Weight change:   Intake/Output Summary (Last 24 hours) at 03/04/13 1058 Last data filed at 03/04/13 1016  Gross per 24 hour  Intake   1793 ml  Output      0 ml  Net   1793 ml   Blood pressure 128/61, pulse 49, temperature 98 F (36.7 C), temperature source Oral, resp. rate 20, SpO2 97.00%.  Physical Exam: General: Waking up, pain controlled, NAD CVS: S1-S2 clear Chest: CTAB Abdomen: soft, NT, ND, NBS Extremities: no c/c/e bilaterally   Lab Results: Basic Metabolic Panel:  Recent Labs Lab 03/01/13 0605  03/02/13 0114  NA 139 139  K 4.3 3.9  CL 101 103  CO2 24 26  GLUCOSE 99 99  BUN 15 15  CREATININE 0.67 0.72  CALCIUM 9.7 9.3  MG 2.1  --    Liver Function Tests:  Recent Labs Lab 03/02/13 0114  AST 13  ALT 8  ALKPHOS 79  BILITOT 0.3  PROT 6.5  ALBUMIN 3.1*   CBC:  Recent Labs Lab 02/27/13 1832 03/01/13 0605 03/02/13 0114  WBC 10.9* 6.7 8.3  NEUTROABS 8.4*  --   --   HGB 13.8 14.4 12.0  HCT 40.4 44.6 35.9*  MCV 87.4 88.7 85.5  PLT 332 266 307     Micro Results: Recent Results (from the past 240 hour(s))  URINE CULTURE     Status: None   Collection Time    03/01/13 12:00 PM      Result Value Range Status   Specimen Description URINE, CLEAN CATCH   Final   Special Requests NONE   Final   Culture  Setup Time 03/01/2013 13:02   Final   Colony Count PENDING   Incomplete   Culture Culture reincubated for better growth   Final   Report Status PENDING   Incomplete  SURGICAL PCR SCREEN     Status: None   Collection Time    03/02/13  5:59 AM      Result Value Range Status   MRSA, PCR NEGATIVE  NEGATIVE Final   Staphylococcus aureus NEGATIVE  NEGATIVE Final   Comment:  The Xpert SA Assay (FDA     approved for NASAL specimens     in patients over 45 years of age),     is one component of     a comprehensive surveillance     program.  Test performance has     been validated by [employer] for patients greater     than or equal to 2 year old.     It is not intended     to diagnose infection nor to     guide or monitor treatment.    Studies/Results: Mr Thoracic Spine Wo Contrast  02/26/2013  *RADIOLOGY REPORT*  Clinical Data:  Compression fracture.  Back pain 2 weeks  MRI THORACIC AND LUMBAR SPINE WITHOUT CONTRAST  Technique:  Multiplanar and multiecho pulse sequences of the thoracic and lumbar spine were obtained without intravenous contrast.  Comparison:  None  MRI THORACIC SPINE  Findings: Mild chronic compression fracture T3 and T11 without  bone marrow edema.  Severe burst fracture of L1 which is described in a separate lumbar MRI report.  No mass lesion in the thoracic spine. There is no cord compression.  Spinal cord signal is normal.  Disc degeneration and spurring at T10-11 related to the chronic fracture of T11.  There is mild posterior element hypertrophy and mild spinal stenosis at this level.  No acute disc protrusion or cord deformity in the thoracic spine.  IMPRESSION:  Chronic compression fractures T3 and T11.  No acute fracture or mass in the thoracic spine.  Burst fracture L1, see below report  MRI LUMBAR SPINE  Findings: Burst fracture L1 with comminuted vertebral body and retropulsion of bone into the spinal canal.  No compression of the conus medullaris.  There is mild spinal stenosis at this level. There is diffuse bone marrow edema in the vertebral body consistent with an acute fracture.  This is most likely a benign fracture.  Chronic fracture T11.  No other lumbar fracture.  L1-2:  Mild disc degeneration  L2-3:  Negative  L3-4:  Mild disc and facet degeneration without stenosis  L4-5:  3 mm anterior slip.  Disc and facet degeneration with moderate spinal stenosis.  There is advanced facet hypertrophy bilaterally causing mild foraminal narrowing.  L5-S1:  Mild disc and facet degeneration.  Mild anterior slip.  IMPRESSION:  Acute burst fracture of L1 with retropulsion of bone into the spinal canal and mild spinal stenosis.  No compression of the conus medullaris.  This appears to be an acute fracture.  Moderate spinal stenosis L4-5.   Original Report Authenticated By: Janeece Riggers, M.D.    Mr Lumbar Spine Wo Contrast  02/26/2013  *RADIOLOGY REPORT*  Clinical Data:  Compression fracture.  Back pain 2 weeks  MRI THORACIC AND LUMBAR SPINE WITHOUT CONTRAST  Technique:  Multiplanar and multiecho pulse sequences of the thoracic and lumbar spine were obtained without intravenous contrast.  Comparison:  None  MRI THORACIC SPINE  Findings:  Mild chronic compression fracture T3 and T11 without bone marrow edema.  Severe burst fracture of L1 which is described in a separate lumbar MRI report.  No mass lesion in the thoracic spine. There is no cord compression.  Spinal cord signal is normal.  Disc degeneration and spurring at T10-11 related to the chronic fracture of T11.  There is mild posterior element hypertrophy and mild spinal stenosis at this level.  No acute disc protrusion or cord deformity in the thoracic spine.  IMPRESSION:  Chronic compression fractures T3 and T11.  No acute fracture or mass in the thoracic spine.  Burst fracture L1, see below report  MRI LUMBAR SPINE  Findings: Burst fracture L1 with comminuted vertebral body and retropulsion of bone into the spinal canal.  No compression of the conus medullaris.  There is mild spinal stenosis at this level. There is diffuse bone marrow edema in the vertebral body consistent with an acute fracture.  This is most likely a benign fracture.  Chronic fracture T11.  No other lumbar fracture.  L1-2:  Mild disc degeneration  L2-3:  Negative  L3-4:  Mild disc and facet degeneration without stenosis  L4-5:  3 mm anterior slip.  Disc and facet degeneration with moderate spinal stenosis.  There is advanced facet hypertrophy bilaterally causing mild foraminal narrowing.  L5-S1:  Mild disc and facet degeneration.  Mild anterior slip.  IMPRESSION:  Acute burst fracture of L1 with retropulsion of bone into the spinal canal and mild spinal stenosis.  No compression of the conus medullaris.  This appears to be an acute fracture.  Moderate spinal stenosis L4-5.   Original Report Authenticated By: Janeece Riggers, M.D.     Medications: Scheduled Meds: . acetaminophen  500 mg Oral TID  . amLODipine  5 mg Oral Daily  . cefTRIAXone (ROCEPHIN)  IV  1 g Intravenous Q24H  . docusate sodium  100 mg Oral BID  . hydrochlorothiazide  25 mg Oral Daily  . irbesartan  150 mg Oral Daily  . levothyroxine  200 mcg Oral  QAC breakfast  . metoprolol tartrate  25 mg Oral BID  . niacin  500 mg Oral QHS  . pantoprazole (PROTONIX) IV  40 mg Intravenous QHS  . polyethylene glycol  17 g Oral Daily  . senna  1 tablet Oral BID  . sodium chloride  3 mL Intravenous Q12H  . sodium chloride  3 mL Intravenous Q12H      LOS: 5 days   Avereigh Spainhower M.D. [facility] 03/04/2013, 10:58 AM Pager: [phone]  If 7PM-7AM, please contact night-coverage [email or web address] Password [email or web address]

## 2013-03-04 NOTE — Progress Notes (Signed)
Doctor spoke with inpatient rehab, patient unable to be evaluated during the weekend.  Patient requests remaining inpatient until evaluation by rehab unit 4000. Understands that may not quailfy to rehab and could be planned discharge on Monday with home health. Will continue to follow.

## 2013-03-05 MED ORDER — ATORVASTATIN CALCIUM 40 MG PO TABS
40.0000 mg | ORAL_TABLET | Freq: Every day | ORAL | Status: DC
  Administered 2013-03-05: 40 mg via ORAL
  Filled 2013-03-05 (×2): qty 1

## 2013-03-05 MED ORDER — NITROFURANTOIN MONOHYD MACRO 100 MG PO CAPS
100.0000 mg | ORAL_CAPSULE | Freq: Two times a day (BID) | ORAL | Status: DC
  Administered 2013-03-05 – 2013-03-06 (×3): 100 mg via ORAL
  Filled 2013-03-05 (×4): qty 1

## 2013-03-05 MED ORDER — ASPIRIN 81 MG PO CHEW
81.0000 mg | CHEWABLE_TABLET | Freq: Every day | ORAL | Status: DC
  Administered 2013-03-05 – 2013-03-06 (×2): 81 mg via ORAL
  Filled 2013-03-05 (×2): qty 1

## 2013-03-05 NOTE — Progress Notes (Signed)
Patient ID: [name]  female  ZOX:[id number]    DOB: [birth year]    DOA: 02/27/2013  PCP: Hoyle Sauer, MD  Assessment/Plan: Principal Problem:   Fracture of L1 vertebra with acute back pain - Continue pain control, s/p kyphoplasty on 3/13 - PT eval recommending CIR or 24 hr supervision,  - CIR unavailable for evaluation over the weekend  Active Problems: Altered mental status with confusion and hallucinations:  resolved likely sec to pain and narcotics,  - Urine culture showed coag negative staph, DC'ed rocephin - since the culture is showing 100,000 colonies of coag neg staph, uncomplicated UTI, will treat it with 5 days of oral nitrofurantoin    HTN (hypertension): Stable, continue home medication    RBBB: Stable  Chronic low back pain: Continue pain control, currently controlled  Hypothyroidism: Continue Synthroid  DVT Prophylaxis:  Code Status:  Disposition: CIR eval tomorrow. Patient's wants her cardiologist, Dr. [place] to be notified. No urgent cardiology consult is needed at this time, I will notify Dr. [place] in the morning.  Subjective:  pain is better controlled, daughter in law at bed side   Objective: Weight change:   Intake/Output Summary (Last 24 hours) at 03/05/13 0950 Last data filed at 03/05/13 0612  Gross per 24 hour  Intake    723 ml  Output      0 ml  Net    723 ml   Blood pressure 139/48, pulse 70, temperature 97.9 F (36.6 C), temperature source Oral, resp. rate 18, SpO2 96.00%.  Physical Exam: General: AXO x3, NAD CVS: S1-S2 clear Chest: CTAB Abdomen: soft, NT, ND, NBS Extremities: no c/c/e bilaterally   Lab Results: Basic Metabolic Panel:  Recent Labs Lab 03/01/13 0605 03/02/13 0114  NA 139 139  K 4.3 3.9  CL 101 103  CO2 24 26  GLUCOSE 99 99  BUN 15 15  CREATININE 0.67 0.72  CALCIUM 9.7 9.3  MG 2.1  --    Liver Function Tests:  Recent Labs Lab 03/02/13 0114  AST 13  ALT 8  ALKPHOS 79  BILITOT 0.3  PROT  6.5  ALBUMIN 3.1*   CBC:  Recent Labs Lab 02/27/13 1832 03/01/13 0605 03/02/13 0114  WBC 10.9* 6.7 8.3  NEUTROABS 8.4*  --   --   HGB 13.8 14.4 12.0  HCT 40.4 44.6 35.9*  MCV 87.4 88.7 85.5  PLT 332 266 307     Micro Results: Recent Results (from the past 240 hour(s))  URINE CULTURE     Status: None   Collection Time    03/01/13 12:00 PM      Result Value Range Status   Specimen Description URINE, CLEAN CATCH   Final   Special Requests NONE   Final   Culture  Setup Time 03/01/2013 13:02   Final   Colony Count >=100,000 COLONIES/ML   Final   Culture     Final   Value: STAPHYLOCOCCUS SPECIES (COAGULASE NEGATIVE)     Note: RIFAMPIN AND GENTAMICIN SHOULD NOT BE USED AS SINGLE DRUGS FOR TREATMENT OF STAPH INFECTIONS.   Report Status 03/04/2013 FINAL   Final   Organism ID, Bacteria STAPHYLOCOCCUS SPECIES (COAGULASE NEGATIVE)   Final  SURGICAL PCR SCREEN     Status: None   Collection Time    03/02/13  5:59 AM      Result Value Range Status   MRSA, PCR NEGATIVE  NEGATIVE Final   Staphylococcus aureus NEGATIVE  NEGATIVE Final   Comment:  The Xpert SA Assay (FDA     approved for NASAL specimens     in patients over 5 years of age),     is one component of     a comprehensive surveillance     program.  Test performance has     been validated by [employer] for patients greater     than or equal to 48 year old.     It is not intended     to diagnose infection nor to     guide or monitor treatment.    Studies/Results: Mr Thoracic Spine Wo Contrast  02/26/2013  *RADIOLOGY REPORT*  Clinical Data:  Compression fracture.  Back pain 2 weeks  MRI THORACIC AND LUMBAR SPINE WITHOUT CONTRAST  Technique:  Multiplanar and multiecho pulse sequences of the thoracic and lumbar spine were obtained without intravenous contrast.  Comparison:  None  MRI THORACIC SPINE  Findings: Mild chronic compression fracture T3 and T11 without bone marrow edema.  Severe burst fracture of  L1 which is described in a separate lumbar MRI report.  No mass lesion in the thoracic spine. There is no cord compression.  Spinal cord signal is normal.  Disc degeneration and spurring at T10-11 related to the chronic fracture of T11.  There is mild posterior element hypertrophy and mild spinal stenosis at this level.  No acute disc protrusion or cord deformity in the thoracic spine.  IMPRESSION:  Chronic compression fractures T3 and T11.  No acute fracture or mass in the thoracic spine.  Burst fracture L1, see below report  MRI LUMBAR SPINE  Findings: Burst fracture L1 with comminuted vertebral body and retropulsion of bone into the spinal canal.  No compression of the conus medullaris.  There is mild spinal stenosis at this level. There is diffuse bone marrow edema in the vertebral body consistent with an acute fracture.  This is most likely a benign fracture.  Chronic fracture T11.  No other lumbar fracture.  L1-2:  Mild disc degeneration  L2-3:  Negative  L3-4:  Mild disc and facet degeneration without stenosis  L4-5:  3 mm anterior slip.  Disc and facet degeneration with moderate spinal stenosis.  There is advanced facet hypertrophy bilaterally causing mild foraminal narrowing.  L5-S1:  Mild disc and facet degeneration.  Mild anterior slip.  IMPRESSION:  Acute burst fracture of L1 with retropulsion of bone into the spinal canal and mild spinal stenosis.  No compression of the conus medullaris.  This appears to be an acute fracture.  Moderate spinal stenosis L4-5.   Original Report Authenticated By: Janeece Riggers, M.D.    Mr Lumbar Spine Wo Contrast  02/26/2013  *RADIOLOGY REPORT*  Clinical Data:  Compression fracture.  Back pain 2 weeks  MRI THORACIC AND LUMBAR SPINE WITHOUT CONTRAST  Technique:  Multiplanar and multiecho pulse sequences of the thoracic and lumbar spine were obtained without intravenous contrast.  Comparison:  None  MRI THORACIC SPINE  Findings: Mild chronic compression fracture T3 and T11  without bone marrow edema.  Severe burst fracture of L1 which is described in a separate lumbar MRI report.  No mass lesion in the thoracic spine. There is no cord compression.  Spinal cord signal is normal.  Disc degeneration and spurring at T10-11 related to the chronic fracture of T11.  There is mild posterior element hypertrophy and mild spinal stenosis at this level.  No acute disc protrusion or cord deformity in the thoracic spine.  IMPRESSION:  Chronic compression fractures T3 and T11.  No acute fracture or mass in the thoracic spine.  Burst fracture L1, see below report  MRI LUMBAR SPINE  Findings: Burst fracture L1 with comminuted vertebral body and retropulsion of bone into the spinal canal.  No compression of the conus medullaris.  There is mild spinal stenosis at this level. There is diffuse bone marrow edema in the vertebral body consistent with an acute fracture.  This is most likely a benign fracture.  Chronic fracture T11.  No other lumbar fracture.  L1-2:  Mild disc degeneration  L2-3:  Negative  L3-4:  Mild disc and facet degeneration without stenosis  L4-5:  3 mm anterior slip.  Disc and facet degeneration with moderate spinal stenosis.  There is advanced facet hypertrophy bilaterally causing mild foraminal narrowing.  L5-S1:  Mild disc and facet degeneration.  Mild anterior slip.  IMPRESSION:  Acute burst fracture of L1 with retropulsion of bone into the spinal canal and mild spinal stenosis.  No compression of the conus medullaris.  This appears to be an acute fracture.  Moderate spinal stenosis L4-5.   Original Report Authenticated By: Janeece Riggers, M.D.     Medications: Scheduled Meds: . acetaminophen  500 mg Oral TID  . amLODipine  5 mg Oral Daily  . aspirin  81 mg Oral Daily  . atorvastatin  40 mg Oral q1800  . cefTRIAXone (ROCEPHIN)  IV  1 g Intravenous Q24H  . docusate sodium  100 mg Oral BID  . hydrochlorothiazide  25 mg Oral Daily  . irbesartan  150 mg Oral Daily  .  levothyroxine  200 mcg Oral QAC breakfast  . metoprolol tartrate  25 mg Oral BID  . pantoprazole  40 mg Oral Q1200  . polyethylene glycol  17 g Oral Daily  . senna  1 tablet Oral BID  . sodium chloride  3 mL Intravenous Q12H  . sodium chloride  3 mL Intravenous Q12H      LOS: 6 days   RAI,RIPUDEEP M.D. [facility] 03/05/2013, 9:50 AM Pager: [phone]  If 7PM-7AM, please contact night-coverage [email or web address] Password [email or web address]

## 2013-03-05 NOTE — Progress Notes (Signed)
PT Cancellation Note  Patient Details Name: [name] MRN: [id number] DOB: [birth year]   Cancelled Treatment: Pt politely declined PT today due to just returning to bed with nursing after being up in recliner for "awhile". Pt lying in bed at angle, was able to adjust self to midline with cues and rail assist. Pt agreeable to therapy returning tomorrow.  Sallyanne Kuster 03/05/2013, 12:30 PM  Sallyanne Kuster, PTA Office- [phone]

## 2013-03-06 MED ORDER — DIAZEPAM 5 MG PO TABS: 5.0000 mg | ORAL_TABLET | Freq: Four times a day (QID) | ORAL | Status: DC | PRN

## 2013-03-06 MED ORDER — FLEET ENEMA 7-19 GM/118ML RE ENEM
1.0000 | ENEMA | Freq: Every day | RECTAL | Status: DC | PRN
  Administered 2013-03-06: 1 via RECTAL
  Filled 2013-03-06: qty 1

## 2013-03-06 MED ORDER — OXYCODONE HCL 5 MG PO TABS: 5.0000 mg | ORAL_TABLET | ORAL | Status: DC | PRN

## 2013-03-06 MED ORDER — POLYETHYLENE GLYCOL 3350 17 G PO PACK: 17.0000 g | PACK | Freq: Every day | ORAL | Status: DC | PRN

## 2013-03-06 MED ORDER — DSS 100 MG PO CAPS: 100.0000 mg | ORAL_CAPSULE | Freq: Two times a day (BID) | ORAL | Status: AC | PRN

## 2013-03-06 MED ORDER — PANTOPRAZOLE SODIUM 40 MG PO TBEC: 40.0000 mg | DELAYED_RELEASE_TABLET | Freq: Every day | ORAL | Status: DC

## 2013-03-06 MED ORDER — NITROFURANTOIN MONOHYD MACRO 100 MG PO CAPS: 100.0000 mg | ORAL_CAPSULE | Freq: Two times a day (BID) | ORAL | Status: DC

## 2013-03-06 NOTE — Progress Notes (Signed)
Physical Therapy Treatment Patient Details Name: [name] MRN: [id number] DOB: January 28, 1931 Today's Date: 03/06/2013 Time: 0865-7846 PT Time Calculation (min): 24 min  PT Assessment / Plan / Recommendation Comments on Treatment Session  Patient slowly progressing today. Would like to talk with CSW as she did not get approved from CIR. Patient needs to be supervision/Mod I level to return home with husband    Follow Up Recommendations  Supervision/Assistance - 24 hour;SNF     Does the patient have the potential to tolerate intense rehabilitation     Barriers to Discharge        Equipment Recommendations       Recommendations for Other Services    Frequency Min 3X/week   Plan Discharge plan remains appropriate    Precautions / Restrictions Precautions Precautions: Fall Precaution Comments: Left elbow replacement in 93, no lifting or heavy pushing Restrictions Weight Bearing Restrictions: No   Pertinent Vitals/Pain     Mobility  Bed Mobility Rolling Right: 4: Min guard Right Sidelying to Sit: 4: Min guard Sitting - Scoot to Edge of Bed: 4: Min guard Sit to Supine: 4: Min guard Details for Bed Mobility Assistance: Cues for technique, especialy back into bed Transfers Sit to Stand: 4: Min guard;With upper extremity assist;With armrests;From chair/3-in-1 Stand to Sit: 4: Min guard;With upper extremity assist;With armrests;To chair/3-in-1 Details for Transfer Assistance: cues for safest hand placement. Patient with tendency to pull up on RW Ambulation/Gait Ambulation/Gait Assistance: 4: Min guard Ambulation Distance (Feet): 50 Feet Ambulation/Gait Assistance Details: Cues for posture and safety with RW. Patient with grabbing groin pain on  Gait Pattern: Decreased stride length    Exercises     PT Diagnosis:    PT Problem List:   PT Treatment Interventions:     PT Goals Acute Rehab PT Goals PT Goal: Rolling Supine to Right Side - Progress: Progressing toward  goal PT Goal: Rolling Supine to Left Side - Progress: Progressing toward goal PT Goal: Supine/Side to Sit - Progress: Progressing toward goal PT Goal: Sit to Supine/Side - Progress: Progressing toward goal PT Goal: Sit to Stand - Progress: Progressing toward goal PT Goal: Stand to Sit - Progress: Progressing toward goal PT Goal: Ambulate - Progress: Progressing toward goal  Visit Information  Last PT Received On: 03/06/13 Assistance Needed: +1    Subjective Data      Cognition  Cognition Overall Cognitive Status: Appears within functional limits for tasks assessed/performed Arousal/Alertness: Awake/alert Orientation Level: Appears intact for tasks assessed Behavior During Session: [facility] for tasks performed    Balance     End of Session PT - End of Session Activity Tolerance: Patient limited by pain;Patient limited by fatigue Patient left: in chair;with call bell/phone within reach;with family/visitor present Nurse Communication: Mobility status   GP     Fredrich Birks 03/06/2013, 10:25 AM 03/06/2013 Fredrich Birks PTA [phone] pager [phone] office

## 2013-03-06 NOTE — Discharge Summary (Signed)
Physician Discharge Summary  Patient ID: [name] MRN: [id number] DOB/AGE: [birth year] 79 y.o.  Admit date: 02/27/2013 Discharge date: 03/06/2013  Primary Care Physician:  Hoyle Sauer, MD  Discharge Diagnoses:    . HTN (hypertension) . Chronic back pain greater than 3 months duration . RBBB . UTI (urinary tract infection) Fracture of the L1 vertebral status post kyphoplasty  Consults:  Neurosurgery, Dr. Maeola Harman    Discharge Medications:   Medication List    STOP taking these medications       amoxicillin 500 MG capsule  Commonly known as:  AMOXIL     Hydrocodone-Acetaminophen 5-300 MG Tabs      TAKE these medications       amLODipine 5 MG tablet  Commonly known as:  NORVASC  Take 1 tablet (5 mg total) by mouth daily.     aspirin 81 MG tablet  Take 81 mg by mouth daily.     beta carotene w/minerals tablet  Take 1 tablet by mouth daily.     bisacodyl 10 MG suppository  Commonly known as:  DULCOLAX  Place 10 mg rectally daily as needed for constipation.     diazepam 5 MG tablet  Commonly known as:  VALIUM  Take 1 tablet (5 mg total) by mouth every 6 (six) hours as needed (for muscle spasms).     DSS 100 MG Caps  Take 100 mg by mouth 2 (two) times daily as needed for constipation. For constipation     FOSAMAX PO  Take 70 mg by mouth once a week.     hydrochlorothiazide 25 MG tablet  Commonly known as:  HYDRODIURIL  Take 1 tablet (25 mg total) by mouth daily.     irbesartan 150 MG tablet  Commonly known as:  AVAPRO  Take 1 tablet (150 mg total) by mouth at bedtime.     levothyroxine 200 MCG tablet  Commonly known as:  SYNTHROID, LEVOTHROID  Take 200 mcg by mouth daily.     metoprolol tartrate 25 MG tablet  Commonly known as:  LOPRESSOR  Take 1 tablet (25 mg total) by mouth 2 (two) times daily.     niacin 500 MG CR tablet  Commonly known as:  NIASPAN  Take 500 mg by mouth at bedtime.     nitrofurantoin (macrocrystal-monohydrate)  100 MG capsule  Commonly known as:  MACROBID  Take 1 capsule (100 mg total) by mouth every 12 (twelve) hours. X 5 DAYS     oxyCODONE 5 MG immediate release tablet  Commonly known as:  Oxy IR/ROXICODONE  Take 1 tablet (5 mg total) by mouth every 4 (four) hours as needed for pain.     pantoprazole 40 MG tablet  Commonly known as:  PROTONIX  Take 1 tablet (40 mg total) by mouth daily.     polyethylene glycol packet  Commonly known as:  MIRALAX / GLYCOLAX  Take 17 g by mouth daily as needed.     rosuvastatin 20 MG tablet  Commonly known as:  CRESTOR  Take 20 mg by mouth daily.     senna 8.6 MG tablet  Commonly known as:  SENOKOT  Take 1 tablet by mouth daily as needed for constipation.         Brief H and P: For complete details please refer to admission H and P, but in brief 79 yo female with chronic lower back pain that had worsened over the last 3 weeks was sent here from Dr. Fredrich Birks office (neurosurgery)  for pain control. Pt slipped off her bed in October 2013 and did not have any significant pain until about 3-4 weeks ago. She denied any trauma to her back since she fell off the bed onto her buttocks. She denied any urinary incont/bowel incont or saddle anesthesia. She had been able to ambulate but her pain had been poorly controlled with hydrocodone. No fevers. Tolerating po. No n/v/d. No cp,sob, abd pain.    Hospital Course:  Patient is a 79 year old female with chronic back pain who was admitted with acute on chronic pain, MRI of the lumbar spine on 3/9 had shown acute burst fracture of L1 with retropulsion of the bone into the spinal canal and mild spinal stenosis. Apparently patient's pain was not well controlled hence she was sent to the hospital as recommended by neurosurgery. Patient was seen in patient by Dr. Venetia Maxon and she underwent kyphoplasty on 3/13. Patient continue to work with physical therapy for acute rehabilitation. She was declined by the inpatient rehabilitation  and she had made good progress. Home health physical therapy was set up to continue with the rehabilitation process.  Altered mental status with confusion and hallucinations: resolved likely sec to pain and narcotics, noticed during the initial period of admission. Urine culture showed coag negative staph. Initially patient was started on IV Rocephin however culture is showing 100,000 colonies of coag neg staph, uncomplicated UTI, will treat it with 5 days of oral nitrofurantoin   HTN (hypertension): Stable, continue home medication  RBBB: Stable  Chronic low back pain: Continue pain control, currently controlled  Hypothyroidism: Continue Synthroid   Day of Discharge BP 160/57  Pulse 62  Temp(Src) 98.1 F (36.7 C) (Oral)  Resp 18  SpO2 96%  Physical Exam: General: Alert and awake oriented x3 not in any acute distress. CVS: S1-S2 clear no murmur rubs or gallops Chest: clear to auscultation bilaterally, no wheezing rales or rhonchi Abdomen: soft nontender, nondistended, normal bowel sounds Extremities: no cyanosis, clubbing or edema noted bilaterally    The results of significant diagnostics from this hospitalization (including imaging, microbiology, ancillary and laboratory) are listed below for reference.    LAB RESULTS: Basic Metabolic Panel:  Recent Labs Lab 03/01/13 0605 03/02/13 0114  NA 139 139  K 4.3 3.9  CL 101 103  CO2 24 26  GLUCOSE 99 99  BUN 15 15  CREATININE 0.67 0.72  CALCIUM 9.7 9.3  MG 2.1  --    Liver Function Tests:  Recent Labs Lab 03/02/13 0114  AST 13  ALT 8  ALKPHOS 79  BILITOT 0.3  PROT 6.5  ALBUMIN 3.1*   No results found for this basename: LIPASE, AMYLASE,  in the last 168 hours No results found for this basename: AMMONIA,  in the last 168 hours CBC:  Recent Labs Lab 02/27/13 1832 03/01/13 0605 03/02/13 0114  WBC 10.9* 6.7 8.3  NEUTROABS 8.4*  --   --   HGB 13.8 14.4 12.0  HCT 40.4 44.6 35.9*  MCV 87.4 88.7 85.5  PLT 332  266 307    Significant Diagnostic Studies:  No results found.    Disposition and Follow-up:     Discharge Orders   Future Orders Complete By Expires     Diet - low sodium heart healthy  As directed     Discharge instructions  As directed     Comments:      Avoid heavy pushing, pulling or lifting weight above 10lbs    Increase activity slowly  As directed         DISPOSITION: Home DIET: Heart healthy diet ACTIVITY: As tolerated   DISCHARGE FOLLOW-UP Follow-up Information   Follow up with Hoyle Sauer, MD. Schedule an appointment as soon as possible for a visit in 2 weeks. (for hospital follow-up)    Contact information:   [address] [facility] [place] [address] [phone]       Follow up with STERN,JOSEPH D, MD. Schedule an appointment as soon as possible for a visit in 3 weeks. (will need AP/lateral xray on appt)    Contact information:   [address] [place] [address] [phone]       Time spent on Discharge: 40 mins  Signed:   Narya Beavin M.D. [facility] 03/06/2013, 1:26 PM Pager: [phone]

## 2013-03-06 NOTE — Progress Notes (Signed)
Thank you for consult on Ms. [name].  Progress/Therapy notes reviewed. Patient is doing well with therapy-pain is managed and she's min/min guard assist overall. Will defer CIR consult and  recommend HHPT/OT for follow up past discharge.

## 2013-03-06 NOTE — Progress Notes (Signed)
CARE MANAGEMENT NOTE 03/06/2013  Patient:  [name], [name]   Account Number:  [id number]  Date Initiated:  03/03/2013  Documentation initiated by:  Vance Peper  Subjective/Objective Assessment:   79 yr old female s/p kyphoplasty     Action/Plan:   CM spoke with patient concerning home health needs and DME. Choice offered. patient wanted to stay longer, explained no medical need and that she couldnt go to snf for same reason.  Pt has been provided rw and 3in1 in 2011,not eligible.   Anticipated DC Date:  03/06/2013   Anticipated DC Plan:  HOME W HOME HEALTH SERVICES      DC Planning Services  CM consult      [facility] Choice  HOME HEALTH   Choice offered to / List presented to:  C-1 Patient        HH arranged  HH-1 RN  HH-2 PT  HH-3 OT  HH-4 NURSE'S AIDE      HH agency  [facility]   Status of service:  Completed, signed off Medicare Important Message given?   (If response is "NO", the following Medicare IM given date fields will be blank) Date Medicare IM given:   Date Additional Medicare IM given:    Discharge Disposition:  HOME W HOME HEALTH SERVICES  Per UR Regulation:    If discussed at Long Length of Stay Meetings, dates discussed:    Comments:  03/06/13 12:34 Vance Peper, RN BSN Case Manager Patient states she will get walker and 3in1 from [place] or thrift store. Her husband is using the DME she recieved previously.Daughter will be transporting patient home.

## 2013-03-08 ENCOUNTER — Other Ambulatory Visit: Payer: Self-pay | Admitting: Cardiology

## 2013-03-08 DIAGNOSIS — IMO0002 Reserved for concepts with insufficient information to code with codable children: Secondary | ICD-10-CM | POA: Diagnosis not present

## 2013-03-08 DIAGNOSIS — E669 Obesity, unspecified: Secondary | ICD-10-CM | POA: Diagnosis not present

## 2013-03-08 DIAGNOSIS — N390 Urinary tract infection, site not specified: Secondary | ICD-10-CM | POA: Diagnosis not present

## 2013-03-08 DIAGNOSIS — I10 Essential (primary) hypertension: Secondary | ICD-10-CM | POA: Diagnosis not present

## 2013-03-09 DIAGNOSIS — M549 Dorsalgia, unspecified: Secondary | ICD-10-CM | POA: Diagnosis not present

## 2013-03-09 DIAGNOSIS — Z9889 Other specified postprocedural states: Secondary | ICD-10-CM | POA: Diagnosis not present

## 2013-03-10 ENCOUNTER — Encounter (HOSPITAL_COMMUNITY): Payer: Self-pay | Admitting: Emergency Medicine

## 2013-03-10 ENCOUNTER — Emergency Department (HOSPITAL_COMMUNITY)
Admission: EM | Admit: 2013-03-10 | Discharge: 2013-03-10 | Disposition: A | Discharge status: Home / Self Care | Payer: Medicare Other | Attending: Emergency Medicine | Admitting: Emergency Medicine

## 2013-03-10 DIAGNOSIS — Z8719 Personal history of other diseases of the digestive system: Secondary | ICD-10-CM | POA: Insufficient documentation

## 2013-03-10 DIAGNOSIS — Z7982 Long term (current) use of aspirin: Secondary | ICD-10-CM | POA: Insufficient documentation

## 2013-03-10 DIAGNOSIS — M549 Dorsalgia, unspecified: Secondary | ICD-10-CM | POA: Diagnosis not present

## 2013-03-10 DIAGNOSIS — E785 Hyperlipidemia, unspecified: Secondary | ICD-10-CM | POA: Insufficient documentation

## 2013-03-10 DIAGNOSIS — I10 Essential (primary) hypertension: Secondary | ICD-10-CM | POA: Diagnosis not present

## 2013-03-10 DIAGNOSIS — M8448XA Pathological fracture, other site, initial encounter for fracture: Secondary | ICD-10-CM | POA: Insufficient documentation

## 2013-03-10 DIAGNOSIS — E669 Obesity, unspecified: Secondary | ICD-10-CM | POA: Diagnosis not present

## 2013-03-10 DIAGNOSIS — Z79899 Other long term (current) drug therapy: Secondary | ICD-10-CM | POA: Diagnosis not present

## 2013-03-10 DIAGNOSIS — Z8543 Personal history of malignant neoplasm of ovary: Secondary | ICD-10-CM | POA: Diagnosis not present

## 2013-03-10 DIAGNOSIS — E039 Hypothyroidism, unspecified: Secondary | ICD-10-CM | POA: Insufficient documentation

## 2013-03-10 DIAGNOSIS — Z9889 Other specified postprocedural states: Secondary | ICD-10-CM | POA: Insufficient documentation

## 2013-03-10 DIAGNOSIS — S32000G Wedge compression fracture of unspecified lumbar vertebra, subsequent encounter for fracture with delayed healing: Secondary | ICD-10-CM

## 2013-03-10 DIAGNOSIS — M545 Low back pain: Secondary | ICD-10-CM | POA: Diagnosis not present

## 2013-03-10 DIAGNOSIS — IMO0002 Reserved for concepts with insufficient information to code with codable children: Secondary | ICD-10-CM | POA: Diagnosis not present

## 2013-03-10 LAB — URINE MICROSCOPIC-ADD ON

## 2013-03-10 LAB — POCT I-STAT, CHEM 8
BUN: 36 mg/dL — ABNORMAL HIGH (ref 6–23)
Creatinine, Ser: 1.2 mg/dL — ABNORMAL HIGH (ref 0.50–1.10)
Potassium: 4.2 mEq/L (ref 3.5–5.1)
Sodium: 139 mEq/L (ref 135–145)
TCO2: 29 mmol/L (ref 0–100)

## 2013-03-10 LAB — URINALYSIS, ROUTINE W REFLEX MICROSCOPIC
Glucose, UA: NEGATIVE mg/dL
Protein, ur: NEGATIVE mg/dL

## 2013-03-10 MED ORDER — OXYCODONE HCL 5 MG PO TABS: ORAL_TABLET | ORAL | Status: DC

## 2013-03-10 MED ORDER — ONDANSETRON HCL 4 MG/2ML IJ SOLN
4.0000 mg | Freq: Once | INTRAMUSCULAR | Status: AC
  Administered 2013-03-10: 4 mg via INTRAVENOUS
  Filled 2013-03-10: qty 2

## 2013-03-10 MED ORDER — SODIUM CHLORIDE 0.9 % IV BOLUS (SEPSIS)
1000.0000 mL | Freq: Once | INTRAVENOUS | Status: AC
  Administered 2013-03-10: 1000 mL via INTRAVENOUS

## 2013-03-10 MED ORDER — FENTANYL CITRATE 0.05 MG/ML IJ SOLN
50.0000 ug | Freq: Once | INTRAMUSCULAR | Status: AC
  Administered 2013-03-10: 50 ug via INTRAVENOUS
  Filled 2013-03-10: qty 2

## 2013-03-10 NOTE — ED Notes (Signed)
Pt notified a urine sample is needed.Can not go at this time

## 2013-03-10 NOTE — ED Provider Notes (Addendum)
History     CSN: [id number]  Arrival date & time 03/10/13  1037   First MD Initiated Contact with Patient 03/10/13 1056      Chief Complaint  Patient presents with  . Back Pain    (Consider location/radiation/quality/duration/timing/severity/associated sxs/prior treatment) Patient is a 79 y.o. female presenting with back pain. The history is provided by the patient and a relative.  Back Pain Location:  Lumbar spine Quality:  Aching, stabbing and shooting Radiates to:  Does not radiate Pain severity:  Moderate (pain is a 1/10 while lying and 8/10 when trying to walk) Pain is:  Same all the time Timing:  Intermittent Progression:  Unchanged Context comment:  Pt with known compression fx and kyphoplasty on the 13th of march and d/ced 4 days ago with ongoing pain with ambulation and no improvement from oxycodone 5 Relieved by:  Bed rest Worsened by:  Standing and movement (walking) Ineffective treatments: Has been taking 5 mg of oxycodone with minimal improvement. Associated symptoms: no abdominal pain, no abdominal swelling, no bladder incontinence, no bowel incontinence, no dysuria, no fever, no headaches, no leg pain, no numbness, no paresthesias and no weakness   Associated symptoms comment:  Decreased appetite and has not been drinking well Risk factors: hx of osteoporosis and recent surgery     Past Medical History  Diagnosis Date  . Ovarian cancer     Stage IA grade 2 ovarian cancer  . Obesity   . Hypertension   . Macular degeneration   . Hyperlipidemia   . Hypothyroidism   . Hiatal hernia     Past Surgical History  Procedure Laterality Date  . Elbow arthroplasty      replacing the humeral stem  . Cholecystectomy, laparoscopic    . Elbow replacement    . Colectomy       Rectosigmoid colectomy with reanastomosis  . Other surgical history    . Other surgical history      Ovarian cancer resection and staging  . Kyphoplasty N/A 03/02/2013    Procedure:  KYPHOPLASTY;  Surgeon: Maeola Harman, MD;  Location: [facility];  Service: Neurosurgery;  Laterality: N/A;  Lumbar One Kyphoplasty    Family History  Problem Relation Age of Onset  . Heart attack Father     History  Substance Use Topics  . Smoking status: Never Smoker   . Smokeless tobacco: Not on file  . Alcohol Use: Yes    OB History   Grav Para Term Preterm Abortions TAB SAB Ect Mult Living                  Review of Systems  Constitutional: Negative for fever.  Gastrointestinal: Negative for abdominal pain and bowel incontinence.  Genitourinary: Negative for bladder incontinence and dysuria.  Musculoskeletal: Positive for back pain.  Neurological: Negative for weakness, numbness, headaches and paresthesias.  All other systems reviewed and are negative.    Allergies  Review of patient's allergies indicates no known allergies.  Home Medications   Current Outpatient Rx  Name  Route  Sig  Dispense  Refill  . Alendronate Sodium (FOSAMAX PO)   Oral   Take 70 mg by mouth once a week.          Marland Kitchen amLODipine (NORVASC) 5 MG tablet      TAKE 1 TABLET (5 MG TOTAL) BY MOUTH DAILY.   30 tablet   6   . aspirin 81 MG tablet   Oral   Take 81 mg  by mouth daily.         . beta carotene w/minerals (OCUVITE) tablet   Oral   Take 1 tablet by mouth daily.         . bisacodyl (DULCOLAX) 10 MG suppository   Rectal   Place 10 mg rectally daily as needed for constipation.         . diazepam (VALIUM) 5 MG tablet   Oral   Take 1 tablet (5 mg total) by mouth every 6 (six) hours as needed (for muscle spasms).   30 tablet   0   . docusate sodium 100 MG CAPS   Oral   Take 100 mg by mouth 2 (two) times daily as needed for constipation. For constipation   60 capsule   3   . hydrochlorothiazide (HYDRODIURIL) 25 MG tablet   Oral   Take 1 tablet (25 mg total) by mouth daily.   90 tablet   3   . irbesartan (AVAPRO) 150 MG tablet   Oral   Take 1 tablet (150 mg  total) by mouth at bedtime.         Marland Kitchen levothyroxine (SYNTHROID, LEVOTHROID) 200 MCG tablet   Oral   Take 200 mcg by mouth daily.          . metoprolol tartrate (LOPRESSOR) 25 MG tablet   Oral   Take 1 tablet (25 mg total) by mouth 2 (two) times daily.   60 tablet   5   . niacin (NIASPAN) 500 MG CR tablet   Oral   Take 500 mg by mouth at bedtime.          . nitrofurantoin, macrocrystal-monohydrate, (MACROBID) 100 MG capsule   Oral   Take 1 capsule (100 mg total) by mouth every 12 (twelve) hours. X 5 DAYS   10 capsule   0   . oxyCODONE (OXY IR/ROXICODONE) 5 MG immediate release tablet   Oral   Take 1 tablet (5 mg total) by mouth every 4 (four) hours as needed for pain.   45 tablet   0   . pantoprazole (PROTONIX) 40 MG tablet   Oral   Take 1 tablet (40 mg total) by mouth daily.   30 tablet   3   . polyethylene glycol (MIRALAX / GLYCOLAX) packet   Oral   Take 17 g by mouth daily as needed.   30 each   0   . rosuvastatin (CRESTOR) 20 MG tablet   Oral   Take 20 mg by mouth daily.         Marland Kitchen senna (SENOKOT) 8.6 MG tablet   Oral   Take 1 tablet by mouth daily as needed for constipation.           BP 101/63  Pulse 65  Temp(Src) 97.6 F (36.4 C) (Oral)  Resp 15  Ht 5\' 1"  (1.549 m)  Wt 190 lb (86.183 kg)  BMI 35.92 kg/m2  SpO2 99%  Physical Exam  Nursing note and vitals reviewed. Constitutional: She is oriented to person, place, and time. She appears well-developed and well-nourished. No distress.  HENT:  Head: Normocephalic and atraumatic.  Mouth/Throat: Oropharynx is clear and moist.  Eyes: Conjunctivae and EOM are normal. Pupils are equal, round, and reactive to light.  Neck: Normal range of motion. Neck supple.  Cardiovascular: Normal rate, regular rhythm and intact distal pulses.   No murmur heard. Pulmonary/Chest: Effort normal and breath sounds normal. No respiratory distress. She has no wheezes. She has no  rales.  Abdominal: Soft. She  exhibits no distension. There is no tenderness. There is no rebound and no guarding.  Musculoskeletal: She exhibits no edema and no tenderness.       Lumbar back: She exhibits decreased range of motion, tenderness and bony tenderness. She exhibits no deformity, no spasm and normal pulse.  Neurological: She is alert and oriented to person, place, and time.  Skin: Skin is warm and dry. No rash noted. No erythema.  Psychiatric: She has a normal mood and affect. Her behavior is normal.    ED Course  Procedures (including critical care time)  Labs Reviewed  URINALYSIS, ROUTINE W REFLEX MICROSCOPIC - Abnormal; Notable for the following:    Color, Urine AMBER (*)    APPearance TURBID (*)    Hgb urine dipstick SMALL (*)    Bilirubin Urine SMALL (*)    Ketones, ur 40 (*)    Leukocytes, UA MODERATE (*)    All other components within normal limits  URINE MICROSCOPIC-ADD ON - Abnormal; Notable for the following:    Squamous Epithelial / LPF MANY (*)    Bacteria, UA FEW (*)    All other components within normal limits  POCT I-STAT, CHEM 8 - Abnormal; Notable for the following:    BUN 36 (*)    Creatinine, Ser 1.20 (*)    Glucose, Bld 114 (*)    Hemoglobin 18.0 (*)    HCT 53.0 (*)    All other components within normal limits  URINE CULTURE   No results found.   1. Lumbar compression fracture, with delayed healing, subsequent encounter       MDM   Patient presenting with the complaint of back pain that is worse with standing and trying to ambulate. She has a known L1 compression fracture that was fixed by kyphoplasty on the 13th. She's been home since Monday taking 5 mg of oxycodone for pain. She had plain films done by Dr. Lezlie Lye office yesterday that showed no new compression fractures in good alignment of the spine. She was going to see her regular physician today but because the pain was unable to get into the car so the ambulance took her to her doctor's office who then told her to  come here and did not see her.  Patient states that her pain is a 1 when she's lying in bed but it becomes much worse when she gets up to walk around.  She denies any fever, nausea or vomiting. No abdominal pain or urinary or bowel issues. Philip patient just needs better pain control. She was given fentanyl here. Patient states she's only taken 5 mg and has not tried to take 2 oxycodone. Discussed with the daughter that the most likely will give her better pain control. Daughter also states she has not been eating or drinking well. Patient has a normal blood pressure and is not tachycardic. However her mucous membranes are dry. I-STAT and UA pending. Patient given IV fluids.  1:54 PM Pt's pain improved with fentanyl.  Will attempt to ambulate.  Heme concentrated on exam and given fluids.  2:13 PM Pt able to ambulate in the hall with a wheelchair and daughter states she is much more mobile than prior with better pain control after 50 of Fentanyl.  Will have her increase her oxycodone to 10mg  prn and then back to 5 if pain is controlled.  2:13 PM Discussed with Dr. Felipa Eth who will f/u.   Gwyneth Sprout, MD 03/10/13 1414  Gwyneth Sprout, MD 03/10/13 1521

## 2013-03-10 NOTE — ED Notes (Signed)
Pt claims she cannot urinate at this time.

## 2013-03-10 NOTE — ED Notes (Signed)
AIDET performed. 

## 2013-03-10 NOTE — ED Notes (Signed)
Pt advised she needs to attempt to ambulate.

## 2013-03-10 NOTE — ED Notes (Signed)
Patient states she had too much pain to walk to car so she could go to her doctors appointment this morning.  Patient states that when [facility] took her to her primary, they turned them around and told them to take her to ED.

## 2013-03-11 LAB — URINE CULTURE: Colony Count: >=100000

## 2013-03-12 DIAGNOSIS — IMO0002 Reserved for concepts with insufficient information to code with codable children: Secondary | ICD-10-CM | POA: Diagnosis not present

## 2013-03-12 DIAGNOSIS — I10 Essential (primary) hypertension: Secondary | ICD-10-CM | POA: Diagnosis not present

## 2013-03-12 DIAGNOSIS — E669 Obesity, unspecified: Secondary | ICD-10-CM | POA: Diagnosis not present

## 2013-03-12 DIAGNOSIS — N390 Urinary tract infection, site not specified: Secondary | ICD-10-CM | POA: Diagnosis not present

## 2013-03-12 DIAGNOSIS — R52 Pain, unspecified: Secondary | ICD-10-CM | POA: Diagnosis not present

## 2013-03-13 DIAGNOSIS — I10 Essential (primary) hypertension: Secondary | ICD-10-CM | POA: Diagnosis not present

## 2013-03-13 DIAGNOSIS — N390 Urinary tract infection, site not specified: Secondary | ICD-10-CM | POA: Diagnosis not present

## 2013-03-13 DIAGNOSIS — IMO0002 Reserved for concepts with insufficient information to code with codable children: Secondary | ICD-10-CM | POA: Diagnosis not present

## 2013-03-13 DIAGNOSIS — R52 Pain, unspecified: Secondary | ICD-10-CM | POA: Diagnosis not present

## 2013-03-13 DIAGNOSIS — E669 Obesity, unspecified: Secondary | ICD-10-CM | POA: Diagnosis not present

## 2013-03-15 DIAGNOSIS — N390 Urinary tract infection, site not specified: Secondary | ICD-10-CM | POA: Diagnosis not present

## 2013-03-15 DIAGNOSIS — I10 Essential (primary) hypertension: Secondary | ICD-10-CM | POA: Diagnosis not present

## 2013-03-15 DIAGNOSIS — R52 Pain, unspecified: Secondary | ICD-10-CM | POA: Diagnosis not present

## 2013-03-15 DIAGNOSIS — E669 Obesity, unspecified: Secondary | ICD-10-CM | POA: Diagnosis not present

## 2013-03-15 DIAGNOSIS — IMO0002 Reserved for concepts with insufficient information to code with codable children: Secondary | ICD-10-CM | POA: Diagnosis not present

## 2013-03-16 DIAGNOSIS — R52 Pain, unspecified: Secondary | ICD-10-CM | POA: Diagnosis not present

## 2013-03-16 DIAGNOSIS — E669 Obesity, unspecified: Secondary | ICD-10-CM | POA: Diagnosis not present

## 2013-03-16 DIAGNOSIS — N390 Urinary tract infection, site not specified: Secondary | ICD-10-CM | POA: Diagnosis not present

## 2013-03-16 DIAGNOSIS — IMO0002 Reserved for concepts with insufficient information to code with codable children: Secondary | ICD-10-CM | POA: Diagnosis not present

## 2013-03-16 DIAGNOSIS — I10 Essential (primary) hypertension: Secondary | ICD-10-CM | POA: Diagnosis not present

## 2013-03-19 DIAGNOSIS — I10 Essential (primary) hypertension: Secondary | ICD-10-CM | POA: Diagnosis not present

## 2013-03-19 DIAGNOSIS — IMO0002 Reserved for concepts with insufficient information to code with codable children: Secondary | ICD-10-CM | POA: Diagnosis not present

## 2013-03-19 DIAGNOSIS — R52 Pain, unspecified: Secondary | ICD-10-CM | POA: Diagnosis not present

## 2013-03-19 DIAGNOSIS — E669 Obesity, unspecified: Secondary | ICD-10-CM | POA: Diagnosis not present

## 2013-03-19 DIAGNOSIS — N390 Urinary tract infection, site not specified: Secondary | ICD-10-CM | POA: Diagnosis not present

## 2013-03-20 DIAGNOSIS — E669 Obesity, unspecified: Secondary | ICD-10-CM | POA: Diagnosis not present

## 2013-03-20 DIAGNOSIS — N390 Urinary tract infection, site not specified: Secondary | ICD-10-CM | POA: Diagnosis not present

## 2013-03-20 DIAGNOSIS — IMO0002 Reserved for concepts with insufficient information to code with codable children: Secondary | ICD-10-CM | POA: Diagnosis not present

## 2013-03-20 DIAGNOSIS — R52 Pain, unspecified: Secondary | ICD-10-CM | POA: Diagnosis not present

## 2013-03-20 DIAGNOSIS — I10 Essential (primary) hypertension: Secondary | ICD-10-CM | POA: Diagnosis not present

## 2013-03-21 DIAGNOSIS — E669 Obesity, unspecified: Secondary | ICD-10-CM | POA: Diagnosis not present

## 2013-03-21 DIAGNOSIS — N390 Urinary tract infection, site not specified: Secondary | ICD-10-CM | POA: Diagnosis not present

## 2013-03-21 DIAGNOSIS — R52 Pain, unspecified: Secondary | ICD-10-CM | POA: Diagnosis not present

## 2013-03-21 DIAGNOSIS — IMO0002 Reserved for concepts with insufficient information to code with codable children: Secondary | ICD-10-CM | POA: Diagnosis not present

## 2013-03-21 DIAGNOSIS — I10 Essential (primary) hypertension: Secondary | ICD-10-CM | POA: Diagnosis not present

## 2013-03-22 DIAGNOSIS — D509 Iron deficiency anemia, unspecified: Secondary | ICD-10-CM | POA: Diagnosis not present

## 2013-03-22 DIAGNOSIS — E039 Hypothyroidism, unspecified: Secondary | ICD-10-CM | POA: Diagnosis not present

## 2013-03-22 DIAGNOSIS — K5900 Constipation, unspecified: Secondary | ICD-10-CM | POA: Diagnosis not present

## 2013-03-22 DIAGNOSIS — I10 Essential (primary) hypertension: Secondary | ICD-10-CM | POA: Diagnosis not present

## 2013-03-22 DIAGNOSIS — R630 Anorexia: Secondary | ICD-10-CM | POA: Diagnosis not present

## 2013-03-22 DIAGNOSIS — S32009A Unspecified fracture of unspecified lumbar vertebra, initial encounter for closed fracture: Secondary | ICD-10-CM | POA: Diagnosis not present

## 2013-03-23 DIAGNOSIS — R52 Pain, unspecified: Secondary | ICD-10-CM | POA: Diagnosis not present

## 2013-03-23 DIAGNOSIS — E669 Obesity, unspecified: Secondary | ICD-10-CM | POA: Diagnosis not present

## 2013-03-23 DIAGNOSIS — N390 Urinary tract infection, site not specified: Secondary | ICD-10-CM | POA: Diagnosis not present

## 2013-03-23 DIAGNOSIS — IMO0002 Reserved for concepts with insufficient information to code with codable children: Secondary | ICD-10-CM | POA: Diagnosis not present

## 2013-03-23 DIAGNOSIS — I10 Essential (primary) hypertension: Secondary | ICD-10-CM | POA: Diagnosis not present

## 2013-03-24 DIAGNOSIS — IMO0002 Reserved for concepts with insufficient information to code with codable children: Secondary | ICD-10-CM | POA: Diagnosis not present

## 2013-03-24 DIAGNOSIS — E669 Obesity, unspecified: Secondary | ICD-10-CM | POA: Diagnosis not present

## 2013-03-24 DIAGNOSIS — N390 Urinary tract infection, site not specified: Secondary | ICD-10-CM | POA: Diagnosis not present

## 2013-03-24 DIAGNOSIS — R52 Pain, unspecified: Secondary | ICD-10-CM | POA: Diagnosis not present

## 2013-03-24 DIAGNOSIS — I10 Essential (primary) hypertension: Secondary | ICD-10-CM | POA: Diagnosis not present

## 2013-03-27 DIAGNOSIS — N390 Urinary tract infection, site not specified: Secondary | ICD-10-CM | POA: Diagnosis not present

## 2013-03-27 DIAGNOSIS — IMO0002 Reserved for concepts with insufficient information to code with codable children: Secondary | ICD-10-CM | POA: Diagnosis not present

## 2013-03-27 DIAGNOSIS — R52 Pain, unspecified: Secondary | ICD-10-CM | POA: Diagnosis not present

## 2013-03-27 DIAGNOSIS — E669 Obesity, unspecified: Secondary | ICD-10-CM | POA: Diagnosis not present

## 2013-03-27 DIAGNOSIS — I10 Essential (primary) hypertension: Secondary | ICD-10-CM | POA: Diagnosis not present

## 2013-03-27 DIAGNOSIS — S22009A Unspecified fracture of unspecified thoracic vertebra, initial encounter for closed fracture: Secondary | ICD-10-CM | POA: Diagnosis not present

## 2013-03-28 DIAGNOSIS — I10 Essential (primary) hypertension: Secondary | ICD-10-CM | POA: Diagnosis not present

## 2013-03-28 DIAGNOSIS — R52 Pain, unspecified: Secondary | ICD-10-CM | POA: Diagnosis not present

## 2013-03-28 DIAGNOSIS — N390 Urinary tract infection, site not specified: Secondary | ICD-10-CM | POA: Diagnosis not present

## 2013-03-28 DIAGNOSIS — E669 Obesity, unspecified: Secondary | ICD-10-CM | POA: Diagnosis not present

## 2013-03-28 DIAGNOSIS — IMO0002 Reserved for concepts with insufficient information to code with codable children: Secondary | ICD-10-CM | POA: Diagnosis not present

## 2013-03-29 DIAGNOSIS — N390 Urinary tract infection, site not specified: Secondary | ICD-10-CM | POA: Diagnosis not present

## 2013-03-29 DIAGNOSIS — E669 Obesity, unspecified: Secondary | ICD-10-CM | POA: Diagnosis not present

## 2013-03-29 DIAGNOSIS — I10 Essential (primary) hypertension: Secondary | ICD-10-CM | POA: Diagnosis not present

## 2013-03-29 DIAGNOSIS — IMO0002 Reserved for concepts with insufficient information to code with codable children: Secondary | ICD-10-CM | POA: Diagnosis not present

## 2013-03-29 DIAGNOSIS — R52 Pain, unspecified: Secondary | ICD-10-CM | POA: Diagnosis not present

## 2013-04-03 DIAGNOSIS — E669 Obesity, unspecified: Secondary | ICD-10-CM | POA: Diagnosis not present

## 2013-04-03 DIAGNOSIS — R52 Pain, unspecified: Secondary | ICD-10-CM | POA: Diagnosis not present

## 2013-04-03 DIAGNOSIS — IMO0002 Reserved for concepts with insufficient information to code with codable children: Secondary | ICD-10-CM | POA: Diagnosis not present

## 2013-04-03 DIAGNOSIS — I10 Essential (primary) hypertension: Secondary | ICD-10-CM | POA: Diagnosis not present

## 2013-04-03 DIAGNOSIS — N390 Urinary tract infection, site not specified: Secondary | ICD-10-CM | POA: Diagnosis not present

## 2013-04-04 DIAGNOSIS — I10 Essential (primary) hypertension: Secondary | ICD-10-CM | POA: Diagnosis not present

## 2013-04-04 DIAGNOSIS — IMO0002 Reserved for concepts with insufficient information to code with codable children: Secondary | ICD-10-CM | POA: Diagnosis not present

## 2013-04-04 DIAGNOSIS — N390 Urinary tract infection, site not specified: Secondary | ICD-10-CM | POA: Diagnosis not present

## 2013-04-04 DIAGNOSIS — E669 Obesity, unspecified: Secondary | ICD-10-CM | POA: Diagnosis not present

## 2013-04-04 DIAGNOSIS — R52 Pain, unspecified: Secondary | ICD-10-CM | POA: Diagnosis not present

## 2013-04-12 DIAGNOSIS — R52 Pain, unspecified: Secondary | ICD-10-CM | POA: Diagnosis not present

## 2013-04-12 DIAGNOSIS — I10 Essential (primary) hypertension: Secondary | ICD-10-CM | POA: Diagnosis not present

## 2013-04-12 DIAGNOSIS — N390 Urinary tract infection, site not specified: Secondary | ICD-10-CM | POA: Diagnosis not present

## 2013-04-12 DIAGNOSIS — IMO0002 Reserved for concepts with insufficient information to code with codable children: Secondary | ICD-10-CM | POA: Diagnosis not present

## 2013-04-12 DIAGNOSIS — E669 Obesity, unspecified: Secondary | ICD-10-CM | POA: Diagnosis not present

## 2013-04-13 DIAGNOSIS — M949 Disorder of cartilage, unspecified: Secondary | ICD-10-CM | POA: Diagnosis not present

## 2013-04-13 DIAGNOSIS — Z6832 Body mass index (BMI) 32.0-32.9, adult: Secondary | ICD-10-CM | POA: Diagnosis not present

## 2013-04-13 DIAGNOSIS — M1990 Unspecified osteoarthritis, unspecified site: Secondary | ICD-10-CM | POA: Diagnosis not present

## 2013-04-13 DIAGNOSIS — M169 Osteoarthritis of hip, unspecified: Secondary | ICD-10-CM | POA: Diagnosis not present

## 2013-04-13 DIAGNOSIS — M25559 Pain in unspecified hip: Secondary | ICD-10-CM | POA: Diagnosis not present

## 2013-04-13 DIAGNOSIS — S32009A Unspecified fracture of unspecified lumbar vertebra, initial encounter for closed fracture: Secondary | ICD-10-CM | POA: Diagnosis not present

## 2013-04-13 DIAGNOSIS — M899 Disorder of bone, unspecified: Secondary | ICD-10-CM | POA: Diagnosis not present

## 2013-04-13 DIAGNOSIS — I10 Essential (primary) hypertension: Secondary | ICD-10-CM | POA: Diagnosis not present

## 2013-04-17 DIAGNOSIS — S22009A Unspecified fracture of unspecified thoracic vertebra, initial encounter for closed fracture: Secondary | ICD-10-CM | POA: Diagnosis not present

## 2013-04-18 DIAGNOSIS — E669 Obesity, unspecified: Secondary | ICD-10-CM | POA: Diagnosis not present

## 2013-04-18 DIAGNOSIS — I10 Essential (primary) hypertension: Secondary | ICD-10-CM | POA: Diagnosis not present

## 2013-04-18 DIAGNOSIS — IMO0002 Reserved for concepts with insufficient information to code with codable children: Secondary | ICD-10-CM | POA: Diagnosis not present

## 2013-04-18 DIAGNOSIS — R52 Pain, unspecified: Secondary | ICD-10-CM | POA: Diagnosis not present

## 2013-04-18 DIAGNOSIS — N390 Urinary tract infection, site not specified: Secondary | ICD-10-CM | POA: Diagnosis not present

## 2013-04-21 ENCOUNTER — Other Ambulatory Visit: Payer: Self-pay | Admitting: Neurosurgery

## 2013-04-21 DIAGNOSIS — M25552 Pain in left hip: Secondary | ICD-10-CM

## 2013-04-21 DIAGNOSIS — M545 Low back pain: Secondary | ICD-10-CM

## 2013-04-24 DIAGNOSIS — G5620 Lesion of ulnar nerve, unspecified upper limb: Secondary | ICD-10-CM | POA: Diagnosis not present

## 2013-04-27 ENCOUNTER — Ambulatory Visit
Admission: RE | Admit: 2013-04-27 | Discharge: 2013-04-27 | Disposition: A | Discharge status: Home / Self Care | Payer: Medicare Other | Source: Ambulatory Visit | Attending: Neurosurgery | Admitting: Neurosurgery

## 2013-04-27 DIAGNOSIS — M545 Low back pain: Secondary | ICD-10-CM

## 2013-04-27 DIAGNOSIS — S32009A Unspecified fracture of unspecified lumbar vertebra, initial encounter for closed fracture: Secondary | ICD-10-CM | POA: Diagnosis not present

## 2013-04-27 DIAGNOSIS — M25552 Pain in left hip: Secondary | ICD-10-CM

## 2013-04-27 DIAGNOSIS — M25559 Pain in unspecified hip: Secondary | ICD-10-CM | POA: Diagnosis not present

## 2013-05-05 DIAGNOSIS — S32009A Unspecified fracture of unspecified lumbar vertebra, initial encounter for closed fracture: Secondary | ICD-10-CM | POA: Diagnosis not present

## 2013-05-05 DIAGNOSIS — M810 Age-related osteoporosis without current pathological fracture: Secondary | ICD-10-CM | POA: Diagnosis not present

## 2013-05-11 DIAGNOSIS — M79609 Pain in unspecified limb: Secondary | ICD-10-CM | POA: Diagnosis not present

## 2013-05-16 DIAGNOSIS — E039 Hypothyroidism, unspecified: Secondary | ICD-10-CM | POA: Diagnosis not present

## 2013-05-16 DIAGNOSIS — S32009A Unspecified fracture of unspecified lumbar vertebra, initial encounter for closed fracture: Secondary | ICD-10-CM | POA: Diagnosis not present

## 2013-05-16 DIAGNOSIS — K5900 Constipation, unspecified: Secondary | ICD-10-CM | POA: Diagnosis not present

## 2013-05-16 DIAGNOSIS — Z6832 Body mass index (BMI) 32.0-32.9, adult: Secondary | ICD-10-CM | POA: Diagnosis not present

## 2013-05-16 DIAGNOSIS — D509 Iron deficiency anemia, unspecified: Secondary | ICD-10-CM | POA: Diagnosis not present

## 2013-05-16 DIAGNOSIS — S42309A Unspecified fracture of shaft of humerus, unspecified arm, initial encounter for closed fracture: Secondary | ICD-10-CM | POA: Diagnosis not present

## 2013-05-16 DIAGNOSIS — L989 Disorder of the skin and subcutaneous tissue, unspecified: Secondary | ICD-10-CM | POA: Diagnosis not present

## 2013-05-16 DIAGNOSIS — I10 Essential (primary) hypertension: Secondary | ICD-10-CM | POA: Diagnosis not present

## 2013-05-22 ENCOUNTER — Telehealth (INDEPENDENT_AMBULATORY_CARE_PROVIDER_SITE_OTHER): Payer: Self-pay | Admitting: *Deleted

## 2013-05-22 NOTE — Telephone Encounter (Signed)
Made in error

## 2013-05-29 DIAGNOSIS — T889XXS Complication of surgical and medical care, unspecified, sequela: Secondary | ICD-10-CM | POA: Diagnosis not present

## 2013-06-14 DIAGNOSIS — T889XXS Complication of surgical and medical care, unspecified, sequela: Secondary | ICD-10-CM | POA: Diagnosis not present

## 2013-06-15 DIAGNOSIS — Z6832 Body mass index (BMI) 32.0-32.9, adult: Secondary | ICD-10-CM | POA: Diagnosis not present

## 2013-06-15 DIAGNOSIS — E039 Hypothyroidism, unspecified: Secondary | ICD-10-CM | POA: Diagnosis not present

## 2013-06-15 DIAGNOSIS — S42309A Unspecified fracture of shaft of humerus, unspecified arm, initial encounter for closed fracture: Secondary | ICD-10-CM | POA: Diagnosis not present

## 2013-06-15 DIAGNOSIS — L989 Disorder of the skin and subcutaneous tissue, unspecified: Secondary | ICD-10-CM | POA: Diagnosis not present

## 2013-06-15 DIAGNOSIS — R0602 Shortness of breath: Secondary | ICD-10-CM | POA: Diagnosis not present

## 2013-06-15 DIAGNOSIS — R5381 Other malaise: Secondary | ICD-10-CM | POA: Diagnosis not present

## 2013-06-15 DIAGNOSIS — S32009A Unspecified fracture of unspecified lumbar vertebra, initial encounter for closed fracture: Secondary | ICD-10-CM | POA: Diagnosis not present

## 2013-07-05 DIAGNOSIS — H2510 Age-related nuclear cataract, unspecified eye: Secondary | ICD-10-CM | POA: Diagnosis not present

## 2013-07-05 DIAGNOSIS — H25019 Cortical age-related cataract, unspecified eye: Secondary | ICD-10-CM | POA: Diagnosis not present

## 2013-07-05 DIAGNOSIS — H524 Presbyopia: Secondary | ICD-10-CM | POA: Diagnosis not present

## 2013-07-12 DIAGNOSIS — H61009 Unspecified perichondritis of external ear, unspecified ear: Secondary | ICD-10-CM | POA: Diagnosis not present

## 2013-07-17 DIAGNOSIS — T889XXS Complication of surgical and medical care, unspecified, sequela: Secondary | ICD-10-CM | POA: Diagnosis not present

## 2013-07-18 DIAGNOSIS — M810 Age-related osteoporosis without current pathological fracture: Secondary | ICD-10-CM | POA: Diagnosis not present

## 2013-07-19 ENCOUNTER — Other Ambulatory Visit: Payer: Self-pay

## 2013-07-19 MED ORDER — METOPROLOL TARTRATE 25 MG PO TABS: 25.0000 mg | ORAL_TABLET | Freq: Two times a day (BID) | ORAL | Status: DC

## 2013-07-19 NOTE — Telephone Encounter (Signed)
metoprolol tartrate (LOPRESSOR) 25 MG tablet (Discontinued) 60 tablet 5 12/09/2012 01/25/2013 Take 1 tablet (25 mg total) by mouth 2 (two) times daily. - Oral

## 2013-07-28 DIAGNOSIS — T889XXS Complication of surgical and medical care, unspecified, sequela: Secondary | ICD-10-CM | POA: Diagnosis not present

## 2013-08-07 ENCOUNTER — Encounter: Payer: Self-pay | Admitting: Cardiology

## 2013-08-07 DIAGNOSIS — M810 Age-related osteoporosis without current pathological fracture: Secondary | ICD-10-CM | POA: Diagnosis not present

## 2013-08-07 DIAGNOSIS — I2510 Atherosclerotic heart disease of native coronary artery without angina pectoris: Secondary | ICD-10-CM | POA: Diagnosis not present

## 2013-08-07 DIAGNOSIS — I10 Essential (primary) hypertension: Secondary | ICD-10-CM | POA: Diagnosis not present

## 2013-08-07 DIAGNOSIS — E785 Hyperlipidemia, unspecified: Secondary | ICD-10-CM | POA: Diagnosis not present

## 2013-08-07 DIAGNOSIS — E039 Hypothyroidism, unspecified: Secondary | ICD-10-CM | POA: Diagnosis not present

## 2013-08-10 DIAGNOSIS — I10 Essential (primary) hypertension: Secondary | ICD-10-CM | POA: Diagnosis not present

## 2013-08-10 DIAGNOSIS — E039 Hypothyroidism, unspecified: Secondary | ICD-10-CM | POA: Diagnosis not present

## 2013-08-10 DIAGNOSIS — M810 Age-related osteoporosis without current pathological fracture: Secondary | ICD-10-CM | POA: Diagnosis not present

## 2013-08-10 DIAGNOSIS — K449 Diaphragmatic hernia without obstruction or gangrene: Secondary | ICD-10-CM | POA: Diagnosis not present

## 2013-08-10 DIAGNOSIS — M1990 Unspecified osteoarthritis, unspecified site: Secondary | ICD-10-CM | POA: Diagnosis not present

## 2013-08-10 DIAGNOSIS — C569 Malignant neoplasm of unspecified ovary: Secondary | ICD-10-CM | POA: Diagnosis not present

## 2013-08-10 DIAGNOSIS — Z0000 Encounter for general adult medical examination without abnormal findings: Secondary | ICD-10-CM | POA: Diagnosis not present

## 2013-08-10 DIAGNOSIS — R82998 Other abnormal findings in urine: Secondary | ICD-10-CM | POA: Diagnosis not present

## 2013-08-10 DIAGNOSIS — E785 Hyperlipidemia, unspecified: Secondary | ICD-10-CM | POA: Diagnosis not present

## 2013-08-11 ENCOUNTER — Ambulatory Visit (INDEPENDENT_AMBULATORY_CARE_PROVIDER_SITE_OTHER): Payer: Medicare Other | Admitting: Cardiology

## 2013-08-11 ENCOUNTER — Encounter: Payer: Self-pay | Admitting: Cardiology

## 2013-08-11 VITALS — BP 135/67 | HR 70 | Ht 61.0 in | Wt 173.0 lb

## 2013-08-11 DIAGNOSIS — I10 Essential (primary) hypertension: Secondary | ICD-10-CM

## 2013-08-11 DIAGNOSIS — I4510 Unspecified right bundle-branch block: Secondary | ICD-10-CM

## 2013-08-11 NOTE — Progress Notes (Signed)
[name] Date of Birth: 07-25-1931 Medical Record #[id number]  History of Present Illness: [name] is seen for follow up today. She has a history of hypertension, hyperlipidemia, and right bundle branch block. She's had previous left elbow replacement with revision in 2011. Recently she's had complete dislocation of her elbow prosthesis. She is being considered for repeat surgery. She has been feeling well. She denies any chest pain or shortness of breath. She has lost 21 pounds this year. She denies any palpitations  Current Outpatient Prescriptions on File Prior to Visit  Medication Sig Dispense Refill  . Alendronate Sodium (FOSAMAX PO) Take 70 mg by mouth once a week.       Marland Kitchen amLODipine (NORVASC) 5 MG tablet Take 5 mg by mouth daily.      Marland Kitchen aspirin 81 MG tablet Take 81 mg by mouth daily.      . beta carotene w/minerals (OCUVITE) tablet Take 1 tablet by mouth daily.      . bisacodyl (DULCOLAX) 10 MG suppository Place 10 mg rectally daily as needed for constipation.      . docusate sodium 100 MG CAPS Take 100 mg by mouth 2 (two) times daily as needed for constipation. For constipation  60 capsule  3  . hydrochlorothiazide (HYDRODIURIL) 25 MG tablet Take 1 tablet (25 mg total) by mouth daily.  90 tablet  3  . irbesartan (AVAPRO) 150 MG tablet Take 1 tablet (150 mg total) by mouth at bedtime.      Marland Kitchen levothyroxine (SYNTHROID, LEVOTHROID) 200 MCG tablet Take 200 mcg by mouth daily.       . metoprolol tartrate (LOPRESSOR) 25 MG tablet Take 1 tablet (25 mg total) by mouth 2 (two) times daily.  60 tablet  5  . niacin (NIASPAN) 500 MG CR tablet Take 500 mg by mouth at bedtime.       . pantoprazole (PROTONIX) 40 MG tablet Take 1 tablet (40 mg total) by mouth daily.  30 tablet  3  . polyethylene glycol (MIRALAX / GLYCOLAX) packet Take 17 g by mouth daily as needed.  30 each  0  . rosuvastatin (CRESTOR) 20 MG tablet Take 20 mg by mouth daily.      Marland Kitchen senna (SENOKOT) 8.6 MG tablet Take 1  tablet by mouth daily as needed for constipation.       No current facility-administered medications on file prior to visit.    No Known Allergies  Past Medical History  Diagnosis Date  . Ovarian cancer     Stage IA grade 2 ovarian cancer  . Obesity   . Hypertension   . Macular degeneration   . Hyperlipidemia   . Hypothyroidism   . Hiatal hernia     Past Surgical History  Procedure Laterality Date  . Elbow arthroplasty      replacing the humeral stem  . Cholecystectomy, laparoscopic    . Elbow replacement    . Colectomy       Rectosigmoid colectomy with reanastomosis  . Other surgical history    . Other surgical history      Ovarian cancer resection and staging  . Kyphoplasty N/A 03/02/2013    Procedure: KYPHOPLASTY;  Surgeon: Maeola Harman, MD;  Location: [facility];  Service: Neurosurgery;  Laterality: N/A;  Lumbar One Kyphoplasty    History  Smoking status  . Never Smoker   Smokeless tobacco  . Not on file    History  Alcohol Use  . Yes  Family History  Problem Relation Age of Onset  . Heart attack Father     Review of Systems: As noted in history of present illness.  All other systems were reviewed and are negative.  Physical Exam: BP 135/67  Pulse 70  Ht 5\' 1"  (1.549 m)  Wt 173 lb (78.472 kg)  BMI 32.7 kg/m2 She is an obese, elderly female in no acute distress.The patient is alert and oriented x 3.    The skin is warm and dry.  The HEENT exam is normal. The carotids are 2+ without bruits.  There is no thyromegaly.  There is no JVD.  The lungs are clear.   The heart exam reveals a regular rate with a normal S1 and S2.  There are no murmurs, gallops, or rubs.  The PMI is not displaced.   Abdominal exam reveals good bowel sounds.  There is no guarding or rebound.  There is no hepatosplenomegaly or tenderness.  There are no masses.  Exam of the legs reveal no clubbing, cyanosis, or edema. Her left elbow prosthesis is dislocated. The distal pulses are  intact.  Cranial nerves II - XII are intact.  Motor and sensory functions are intact.  She uses a cane to walk.  LABORATORY DATA: Laboratory data from 08/07/2013-complete chemistry panel is normal. CBC is normal. Total cholesterol 178, triglycerides 107, HDL 53, LDL 104. TSH 1.51.  Assessment / Plan: 1. Hypertension. Blood pressure is well controlled. Continue current medical therapy.  2. Chronic right bundle branch block.  3. Dislocation of prior elbow or placement. She is cleared for surgery from a cardiac standpoint.

## 2013-08-11 NOTE — Patient Instructions (Signed)
You are clear for elbow surgery.  Continue your current medication.  I will see you in 6 months.

## 2013-08-19 ENCOUNTER — Emergency Department (INDEPENDENT_AMBULATORY_CARE_PROVIDER_SITE_OTHER)
Admission: EM | Admit: 2013-08-19 | Discharge: 2013-08-19 | Disposition: A | Discharge status: Home / Self Care | Payer: Medicare Other | Source: Home / Self Care

## 2013-08-19 ENCOUNTER — Encounter (HOSPITAL_COMMUNITY): Payer: Self-pay | Admitting: Emergency Medicine

## 2013-08-19 DIAGNOSIS — B029 Zoster without complications: Secondary | ICD-10-CM | POA: Diagnosis not present

## 2013-08-19 MED ORDER — VALACYCLOVIR HCL 1 G PO TABS: 1000.0000 mg | ORAL_TABLET | Freq: Three times a day (TID) | ORAL | Status: DC

## 2013-08-19 NOTE — ED Notes (Signed)
C/o rash on back.  Patient states the rash has been there since Wednesday and her husband has been looking at it and feels as if its getting better.   Patient states she looked at it today and it did not look good to her so she took some medication and came here.  Rash does itch.

## 2013-08-19 NOTE — ED Provider Notes (Signed)
[name] is a 79 y.o. female who presents to Urgent Care today for rash. Patient notes a rash on her right back radiating to her right chest starting several days ago. It is painless. She took 2 g of amoxicillin last night for the rash which has not helped. She denies any tingling or numbness in the distribution of the rash. She feels well otherwise. She denies any history of shingles. No new soaps or creams or detergents.    PMH reviewed. Significant for hypertension, osteoporosis, history of ovarian cancer. Additionally significant for a left total elbow replacement now with pseudoarthrosis proximal to the prosthesis.  History  Substance Use Topics  . Smoking status: Never Smoker   . Smokeless tobacco: Not on file  . Alcohol Use: Yes   ROS as above Medications reviewed. No current facility-administered medications for this encounter.   Current Outpatient Prescriptions  Medication Sig Dispense Refill  . Alendronate Sodium (FOSAMAX PO) Take 70 mg by mouth once a week.       Marland Kitchen amLODipine (NORVASC) 5 MG tablet Take 5 mg by mouth daily.      Marland Kitchen aspirin 81 MG tablet Take 81 mg by mouth daily.      . beta carotene w/minerals (OCUVITE) tablet Take 1 tablet by mouth daily.      . bisacodyl (DULCOLAX) 10 MG suppository Place 10 mg rectally daily as needed for constipation.      . docusate sodium 100 MG CAPS Take 100 mg by mouth 2 (two) times daily as needed for constipation. For constipation  60 capsule  3  . hydrochlorothiazide (HYDRODIURIL) 25 MG tablet Take 1 tablet (25 mg total) by mouth daily.  90 tablet  3  . irbesartan (AVAPRO) 150 MG tablet Take 1 tablet (150 mg total) by mouth at bedtime.      Marland Kitchen levothyroxine (SYNTHROID, LEVOTHROID) 200 MCG tablet Take 200 mcg by mouth daily.       . metoprolol tartrate (LOPRESSOR) 25 MG tablet Take 1 tablet (25 mg total) by mouth 2 (two) times daily.  60 tablet  5  . niacin (NIASPAN) 500 MG CR tablet Take 500 mg by mouth at bedtime.       .  pantoprazole (PROTONIX) 40 MG tablet Take 1 tablet (40 mg total) by mouth daily.  30 tablet  3  . polyethylene glycol (MIRALAX / GLYCOLAX) packet Take 17 g by mouth daily as needed.  30 each  0  . rosuvastatin (CRESTOR) 20 MG tablet Take 20 mg by mouth daily.      Marland Kitchen senna (SENOKOT) 8.6 MG tablet Take 1 tablet by mouth daily as needed for constipation.      . TRAMADOL HCL PO Take by mouth as directed.      . valACYclovir (VALTREX) 1000 MG tablet Take 1 tablet (1,000 mg total) by mouth 3 (three) times daily.  21 tablet  0    Exam:  BP 152/48  Pulse 60  Temp(Src) 98.1 F (36.7 C) (Oral)  Resp 14  SpO2 100% Gen: Well NAD  skin: Vesicular rash on an erythematous base in a dermatomal pattern on the right thoracic back radiating to the right anterior chest.  No results found for this or any previous visit (from the past 24 hour(s)). No results found.  Assessment and Plan: 79 y.o. female with shingles.  Plan to treat with Valtrex 1 g 3 times a day for one week.  Followup with primary care provider Discussed warning signs or symptoms.  Please see discharge instructions. Patient expresses understanding.      Rodolph Bong, MD 08/19/13 [phone]

## 2013-08-22 DIAGNOSIS — Z6833 Body mass index (BMI) 33.0-33.9, adult: Secondary | ICD-10-CM | POA: Diagnosis not present

## 2013-08-22 DIAGNOSIS — B029 Zoster without complications: Secondary | ICD-10-CM | POA: Diagnosis not present

## 2013-08-22 NOTE — ED Notes (Signed)
Pt.'s labs faxed to [place] by mistake, by Dr. Vicente Males office.  I think they were supposed to go Dr. Peter [place].  I fowarded them to him. Vassie Moselle 08/22/2013

## 2013-09-11 DIAGNOSIS — IMO0001 Reserved for inherently not codable concepts without codable children: Secondary | ICD-10-CM | POA: Diagnosis not present

## 2013-09-11 DIAGNOSIS — T889XXS Complication of surgical and medical care, unspecified, sequela: Secondary | ICD-10-CM | POA: Diagnosis not present

## 2013-09-27 DIAGNOSIS — IMO0002 Reserved for concepts with insufficient information to code with codable children: Secondary | ICD-10-CM | POA: Diagnosis not present

## 2013-10-02 ENCOUNTER — Other Ambulatory Visit: Payer: Self-pay | Admitting: Cardiology

## 2013-10-19 DIAGNOSIS — B029 Zoster without complications: Secondary | ICD-10-CM | POA: Diagnosis not present

## 2013-10-19 DIAGNOSIS — S42309A Unspecified fracture of shaft of humerus, unspecified arm, initial encounter for closed fracture: Secondary | ICD-10-CM | POA: Diagnosis not present

## 2013-10-19 DIAGNOSIS — K429 Umbilical hernia without obstruction or gangrene: Secondary | ICD-10-CM | POA: Diagnosis not present

## 2013-10-19 DIAGNOSIS — R0602 Shortness of breath: Secondary | ICD-10-CM | POA: Diagnosis not present

## 2013-10-19 DIAGNOSIS — K5900 Constipation, unspecified: Secondary | ICD-10-CM | POA: Diagnosis not present

## 2013-10-19 DIAGNOSIS — Z23 Encounter for immunization: Secondary | ICD-10-CM | POA: Diagnosis not present

## 2013-10-19 DIAGNOSIS — Z6834 Body mass index (BMI) 34.0-34.9, adult: Secondary | ICD-10-CM | POA: Diagnosis not present

## 2013-11-27 DIAGNOSIS — IMO0001 Reserved for inherently not codable concepts without codable children: Secondary | ICD-10-CM | POA: Diagnosis not present

## 2014-01-12 ENCOUNTER — Other Ambulatory Visit: Payer: Self-pay

## 2014-01-12 MED ORDER — METOPROLOL TARTRATE 25 MG PO TABS: 25.0000 mg | ORAL_TABLET | Freq: Two times a day (BID) | ORAL | Status: DC

## 2014-01-18 DIAGNOSIS — M810 Age-related osteoporosis without current pathological fracture: Secondary | ICD-10-CM | POA: Diagnosis not present

## 2014-01-18 DIAGNOSIS — I10 Essential (primary) hypertension: Secondary | ICD-10-CM | POA: Diagnosis not present

## 2014-01-18 DIAGNOSIS — Z6833 Body mass index (BMI) 33.0-33.9, adult: Secondary | ICD-10-CM | POA: Diagnosis not present

## 2014-02-21 DIAGNOSIS — T84049A Periprosthetic fracture around unspecified internal prosthetic joint, initial encounter: Secondary | ICD-10-CM | POA: Diagnosis not present

## 2014-02-21 DIAGNOSIS — IMO0001 Reserved for inherently not codable concepts without codable children: Secondary | ICD-10-CM | POA: Diagnosis not present

## 2014-03-06 DIAGNOSIS — M810 Age-related osteoporosis without current pathological fracture: Secondary | ICD-10-CM | POA: Diagnosis not present

## 2014-03-06 DIAGNOSIS — Z6834 Body mass index (BMI) 34.0-34.9, adult: Secondary | ICD-10-CM | POA: Diagnosis not present

## 2014-04-09 ENCOUNTER — Ambulatory Visit (INDEPENDENT_AMBULATORY_CARE_PROVIDER_SITE_OTHER): Payer: Medicare Other | Admitting: Cardiology

## 2014-04-09 ENCOUNTER — Encounter: Payer: Self-pay | Admitting: Cardiology

## 2014-04-09 VITALS — BP 142/56 | HR 50 | Wt 176.0 lb

## 2014-04-09 DIAGNOSIS — I498 Other specified cardiac arrhythmias: Secondary | ICD-10-CM | POA: Diagnosis not present

## 2014-04-09 DIAGNOSIS — R001 Bradycardia, unspecified: Secondary | ICD-10-CM

## 2014-04-09 DIAGNOSIS — I4510 Unspecified right bundle-branch block: Secondary | ICD-10-CM

## 2014-04-09 DIAGNOSIS — I10 Essential (primary) hypertension: Secondary | ICD-10-CM

## 2014-04-09 NOTE — Progress Notes (Signed)
[name] Date of Birth: 28-Apr-1931 Medical Record #[id number]  History of Present Illness: [name] is seen for follow up today. She has a history of hypertension, hyperlipidemia, and right bundle branch block. She's had previous left elbow replacement with revision in 2011. She now has complete dislocation of her elbow prosthesis.  She has been feeling well. She denies any chest pain or shortness of breath.  She denies any palpitations. She states she has osteoporosis now.  Current Outpatient Prescriptions on File Prior to Visit  Medication Sig Dispense Refill  . Alendronate Sodium (FOSAMAX PO) Take 70 mg by mouth once a week.       Marland Kitchen amLODipine (NORVASC) 5 MG tablet Take 5 mg by mouth daily.      Marland Kitchen amLODipine (NORVASC) 5 MG tablet TAKE 1 TABLET BY MOUTH EVERY DAY  30 tablet  6  . aspirin 81 MG tablet Take 81 mg by mouth daily.      . beta carotene w/minerals (OCUVITE) tablet Take 1 tablet by mouth daily.      . bisacodyl (DULCOLAX) 10 MG suppository Place 10 mg rectally daily as needed for constipation.      . docusate sodium 100 MG CAPS Take 100 mg by mouth 2 (two) times daily as needed for constipation. For constipation  60 capsule  3  . hydrochlorothiazide (HYDRODIURIL) 25 MG tablet Take 1 tablet (25 mg total) by mouth daily.  90 tablet  3  . irbesartan (AVAPRO) 150 MG tablet Take 1 tablet (150 mg total) by mouth at bedtime.      Marland Kitchen levothyroxine (SYNTHROID, LEVOTHROID) 200 MCG tablet Take 200 mcg by mouth daily.       . metoprolol tartrate (LOPRESSOR) 25 MG tablet Take 1 tablet (25 mg total) by mouth 2 (two) times daily.  60 tablet  5  . pantoprazole (PROTONIX) 40 MG tablet Take 1 tablet (40 mg total) by mouth daily.  30 tablet  3  . polyethylene glycol (MIRALAX / GLYCOLAX) packet Take 17 g by mouth daily as needed.  30 each  0  . rosuvastatin (CRESTOR) 20 MG tablet Take 20 mg by mouth daily.      Marland Kitchen senna (SENOKOT) 8.6 MG tablet Take 1 tablet by mouth daily as needed for  constipation.      . TRAMADOL HCL PO Take by mouth as directed.      . valACYclovir (VALTREX) 1000 MG tablet Take 1 tablet (1,000 mg total) by mouth 3 (three) times daily.  21 tablet  0   No current facility-administered medications on file prior to visit.    No Known Allergies  Past Medical History  Diagnosis Date  . Ovarian cancer     Stage IA grade 2 ovarian cancer  . Obesity   . Hypertension   . Macular degeneration   . Hyperlipidemia   . Hypothyroidism   . Hiatal hernia   . Osteoporosis     Past Surgical History  Procedure Laterality Date  . Elbow arthroplasty      replacing the humeral stem  . Cholecystectomy, laparoscopic    . Elbow replacement    . Colectomy       Rectosigmoid colectomy with reanastomosis  . Other surgical history    . Other surgical history      Ovarian cancer resection and staging  . Kyphoplasty N/A 03/02/2013    Procedure: KYPHOPLASTY;  Surgeon: Erline Levine, MD;  Location: [place] [facility];  Service: Neurosurgery;  Laterality: N/A;  Lumbar One Kyphoplasty    History  Smoking status  . Never Smoker   Smokeless tobacco  . Not on file    History  Alcohol Use  . Yes    Family History  Problem Relation Age of Onset  . Heart attack Father     Review of Systems: As noted in history of present illness.  All other systems were reviewed and are negative.  Physical Exam: BP 142/56  Pulse 50  Wt 176 lb (79.833 kg) She is an obese, elderly female in no acute distress. The HEENT exam is normal. The carotids are 2+ without bruits.  There is no thyromegaly.  There is no JVD.  The lungs are clear.   The heart exam reveals a regular rate with a normal S1 and S2.  There are no murmurs, gallops, or rubs.  The PMI is not displaced.   Abdominal exam reveals good bowel sounds.  There is no guarding or rebound.  There is no hepatosplenomegaly or tenderness.  There are no masses.  Exam of the legs reveal no clubbing, cyanosis, or edema. Her left elbow  prosthesis is dislocated. The distal pulses are intact.  Cranial nerves II - XII are intact.  Motor and sensory functions are intact.  She uses a cane to walk.  LABORATORY DATA: Ecg shows NSR with rate 50 bpm. RBBB.  Assessment / Plan: 1. Hypertension. Blood pressure is well controlled. Continue current medical therapy.  2. Chronic right bundle branch block.  3. Dislocation of prior elbow or placement.   4. Hyperlipidemia. Based on AIM-High data I have recommended stopping niacin and continue statin therapy.

## 2014-04-09 NOTE — Patient Instructions (Signed)
Stop taking niacin  Continue your other therapy  I will see you in 6 months.

## 2014-04-30 ENCOUNTER — Other Ambulatory Visit: Payer: Self-pay | Admitting: Cardiology

## 2014-05-09 DIAGNOSIS — Z1231 Encounter for screening mammogram for malignant neoplasm of breast: Secondary | ICD-10-CM | POA: Diagnosis not present

## 2014-05-18 DIAGNOSIS — S42309A Unspecified fracture of shaft of humerus, unspecified arm, initial encounter for closed fracture: Secondary | ICD-10-CM | POA: Diagnosis not present

## 2014-05-18 DIAGNOSIS — Z6834 Body mass index (BMI) 34.0-34.9, adult: Secondary | ICD-10-CM | POA: Diagnosis not present

## 2014-05-18 DIAGNOSIS — M810 Age-related osteoporosis without current pathological fracture: Secondary | ICD-10-CM | POA: Diagnosis not present

## 2014-05-18 DIAGNOSIS — K429 Umbilical hernia without obstruction or gangrene: Secondary | ICD-10-CM | POA: Diagnosis not present

## 2014-05-18 DIAGNOSIS — E039 Hypothyroidism, unspecified: Secondary | ICD-10-CM | POA: Diagnosis not present

## 2014-05-18 DIAGNOSIS — I10 Essential (primary) hypertension: Secondary | ICD-10-CM | POA: Diagnosis not present

## 2014-05-18 DIAGNOSIS — Z1331 Encounter for screening for depression: Secondary | ICD-10-CM | POA: Diagnosis not present

## 2014-05-18 DIAGNOSIS — I2510 Atherosclerotic heart disease of native coronary artery without angina pectoris: Secondary | ICD-10-CM | POA: Diagnosis not present

## 2014-05-18 IMAGING — RF DG LUMBAR SPINE 2-3V
1 series · 1 of 1 positions shown · non-contrast
Comparison: MRI 02/26/2013

CLINICAL DATA: L1 kyphoplasty

DG C-ARM 1-60 MIN,LUMBAR SPINE - 2-3 VIEW
TECHNIQUE: C-arm images were obtained

[Series 1: run · 1 of 1 slices shown]
[im 1/1]
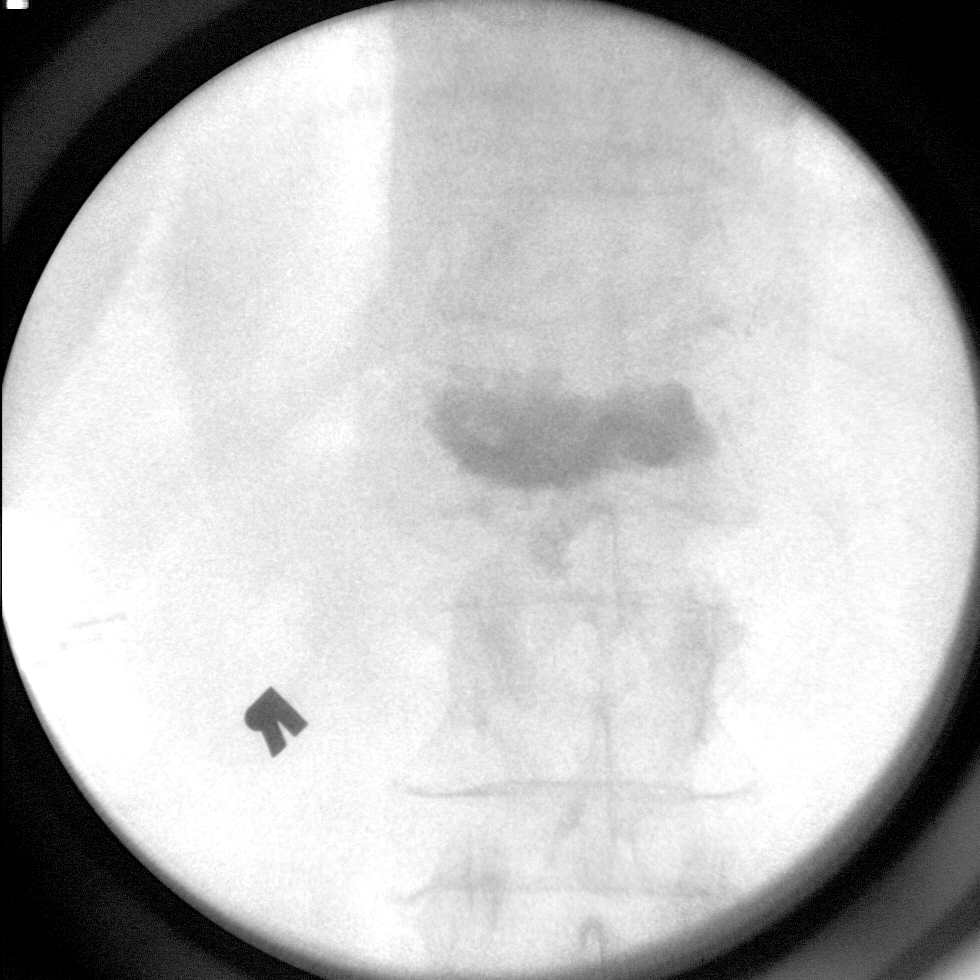

[1 of 1 positions shown; findings below may reference images not displayed]

FINDINGS: Burst fracture of L1 has been treated with cement
vertebral augmentation.  There is cement distributed bilaterally.
Cement extends into the L1-2 disc space.  Limited bony detail
IMPRESSION: Cement  kyphoplasty of L1 fracture.

## 2014-07-16 DIAGNOSIS — H2510 Age-related nuclear cataract, unspecified eye: Secondary | ICD-10-CM | POA: Diagnosis not present

## 2014-08-23 DIAGNOSIS — I2510 Atherosclerotic heart disease of native coronary artery without angina pectoris: Secondary | ICD-10-CM | POA: Diagnosis not present

## 2014-08-23 DIAGNOSIS — I10 Essential (primary) hypertension: Secondary | ICD-10-CM | POA: Diagnosis not present

## 2014-08-23 DIAGNOSIS — E039 Hypothyroidism, unspecified: Secondary | ICD-10-CM | POA: Diagnosis not present

## 2014-08-23 DIAGNOSIS — M810 Age-related osteoporosis without current pathological fracture: Secondary | ICD-10-CM | POA: Diagnosis not present

## 2014-08-23 DIAGNOSIS — R809 Proteinuria, unspecified: Secondary | ICD-10-CM | POA: Diagnosis not present

## 2014-08-23 DIAGNOSIS — E785 Hyperlipidemia, unspecified: Secondary | ICD-10-CM | POA: Diagnosis not present

## 2014-08-23 DIAGNOSIS — R82998 Other abnormal findings in urine: Secondary | ICD-10-CM | POA: Diagnosis not present

## 2014-08-28 DIAGNOSIS — I10 Essential (primary) hypertension: Secondary | ICD-10-CM | POA: Diagnosis not present

## 2014-08-28 DIAGNOSIS — E669 Obesity, unspecified: Secondary | ICD-10-CM | POA: Diagnosis not present

## 2014-08-28 DIAGNOSIS — E039 Hypothyroidism, unspecified: Secondary | ICD-10-CM | POA: Diagnosis not present

## 2014-08-28 DIAGNOSIS — D509 Iron deficiency anemia, unspecified: Secondary | ICD-10-CM | POA: Diagnosis not present

## 2014-08-28 DIAGNOSIS — C569 Malignant neoplasm of unspecified ovary: Secondary | ICD-10-CM | POA: Diagnosis not present

## 2014-08-28 DIAGNOSIS — Z0000 Encounter for general adult medical examination without abnormal findings: Secondary | ICD-10-CM | POA: Diagnosis not present

## 2014-08-28 DIAGNOSIS — H3530 Unspecified macular degeneration: Secondary | ICD-10-CM | POA: Diagnosis not present

## 2014-08-28 DIAGNOSIS — K449 Diaphragmatic hernia without obstruction or gangrene: Secondary | ICD-10-CM | POA: Diagnosis not present

## 2014-08-28 DIAGNOSIS — Z23 Encounter for immunization: Secondary | ICD-10-CM | POA: Diagnosis not present

## 2014-08-28 DIAGNOSIS — Z1331 Encounter for screening for depression: Secondary | ICD-10-CM | POA: Diagnosis not present

## 2014-08-28 DIAGNOSIS — E785 Hyperlipidemia, unspecified: Secondary | ICD-10-CM | POA: Diagnosis not present

## 2014-08-29 DIAGNOSIS — Z9660 Presence of unspecified orthopedic joint implant: Secondary | ICD-10-CM | POA: Diagnosis not present

## 2014-08-29 DIAGNOSIS — Z1212 Encounter for screening for malignant neoplasm of rectum: Secondary | ICD-10-CM | POA: Diagnosis not present

## 2014-08-29 DIAGNOSIS — M8448XD Pathological fracture, other site, subsequent encounter for fracture with routine healing: Secondary | ICD-10-CM | POA: Diagnosis not present

## 2014-09-04 ENCOUNTER — Other Ambulatory Visit: Payer: Self-pay | Admitting: *Deleted

## 2014-09-04 MED ORDER — METOPROLOL TARTRATE 25 MG PO TABS: 25.0000 mg | ORAL_TABLET | Freq: Two times a day (BID) | ORAL | Status: DC

## 2014-10-15 ENCOUNTER — Ambulatory Visit (INDEPENDENT_AMBULATORY_CARE_PROVIDER_SITE_OTHER): Payer: Medicare Other | Admitting: Cardiology

## 2014-10-15 ENCOUNTER — Encounter: Payer: Self-pay | Admitting: Cardiology

## 2014-10-15 VITALS — BP 136/78 | HR 52 | Ht 61.0 in | Wt 179.0 lb

## 2014-10-15 DIAGNOSIS — I10 Essential (primary) hypertension: Secondary | ICD-10-CM

## 2014-10-15 DIAGNOSIS — I4510 Unspecified right bundle-branch block: Secondary | ICD-10-CM | POA: Diagnosis not present

## 2014-10-15 NOTE — Progress Notes (Signed)
[name] Date of Birth: 01-23-1931 Medical Record #[id number]  History of Present Illness: [name] is seen for follow up today. She has a history of hypertension, hyperlipidemia, and right bundle branch block. She's had previous left elbow replacement with revision in 2011. She now has complete dislocation of her elbow prosthesis.  She has been feeling well. She denies any chest pain.  She denies any palpitations. She has occasional SOB but states it comes right back. She got a second opinion about elbow surgery at [facility] but decided it wasn't worth the risk.  Current Outpatient Prescriptions on File Prior to Visit  Medication Sig Dispense Refill  . Alendronate Sodium (FOSAMAX PO) Take 70 mg by mouth once a week.       Marland Kitchen amLODipine (NORVASC) 5 MG tablet Take 5 mg by mouth daily.      Marland Kitchen amLODipine (NORVASC) 5 MG tablet TAKE 1 TABLET BY MOUTH EVERY DAY  30 tablet  5  . aspirin 81 MG tablet Take 81 mg by mouth daily.      . beta carotene w/minerals (OCUVITE) tablet Take 1 tablet by mouth daily.      . bisacodyl (DULCOLAX) 10 MG suppository Place 10 mg rectally daily as needed for constipation.      . docusate sodium 100 MG CAPS Take 100 mg by mouth 2 (two) times daily as needed for constipation. For constipation  60 capsule  3  . hydrochlorothiazide (HYDRODIURIL) 25 MG tablet Take 1 tablet (25 mg total) by mouth daily.  90 tablet  3  . irbesartan (AVAPRO) 150 MG tablet Take 1 tablet (150 mg total) by mouth at bedtime.      Marland Kitchen levothyroxine (SYNTHROID, LEVOTHROID) 200 MCG tablet Take 200 mcg by mouth daily.       . metoprolol tartrate (LOPRESSOR) 25 MG tablet Take 1 tablet (25 mg total) by mouth 2 (two) times daily.  60 tablet  0  . polyethylene glycol (MIRALAX / GLYCOLAX) packet Take 17 g by mouth daily as needed.  30 each  0  . rosuvastatin (CRESTOR) 20 MG tablet Take 20 mg by mouth daily.      Marland Kitchen senna (SENOKOT) 8.6 MG tablet Take 1 tablet by mouth daily as needed for constipation.       . Teriparatide, Recombinant, (FORTEO [place]) Inject into the skin as directed.      Marland Kitchen TRAMADOL HCL PO Take by mouth as directed.       No current facility-administered medications on file prior to visit.    No Known Allergies  Past Medical History  Diagnosis Date  . Ovarian cancer     Stage IA grade 2 ovarian cancer  . Obesity   . Hypertension   . Macular degeneration   . Hyperlipidemia   . Hypothyroidism   . Hiatal hernia   . Osteoporosis     Past Surgical History  Procedure Laterality Date  . Elbow arthroplasty      replacing the humeral stem  . Cholecystectomy, laparoscopic    . Elbow replacement    . Colectomy       Rectosigmoid colectomy with reanastomosis  . Other surgical history    . Other surgical history      Ovarian cancer resection and staging  . Kyphoplasty N/A 03/02/2013    Procedure: KYPHOPLASTY;  Surgeon: Erline Levine, MD;  Location: [place] [facility];  Service: Neurosurgery;  Laterality: N/A;  Lumbar One Kyphoplasty    History  Smoking status  .  Never Smoker   Smokeless tobacco  . Not on file    History  Alcohol Use  . Yes    Family History  Problem Relation Age of Onset  . Heart attack Father     Review of Systems: As noted in history of present illness.  All other systems were reviewed and are negative.  Physical Exam: BP 136/78  Pulse 52  Ht 5\' 1"  (1.549 m)  Wt 179 lb (81.194 kg)  BMI 33.84 kg/m2 She is an obese, elderly female in no acute distress. The HEENT exam is normal. The carotids are 2+ without bruits.  There is no thyromegaly.  There is no JVD.  The lungs are clear.   The heart exam reveals a regular rate with a normal S1 and S2.  There are no murmurs, gallops, or rubs.  The PMI is not displaced.     Exam of the legs reveal no clubbing, cyanosis, or edema. Her left elbow prosthesis is dislocated. The distal pulses are intact.  Cranial nerves II - XII are intact.  She uses a cane to walk.  LABORATORY DATA:   Assessment / Plan: 1.  Hypertension. Blood pressure is well controlled. Continue current medical therapy.  2. Chronic right bundle branch block.  3. Dislocation of prior elbow or placement.   4. Hyperlipidemia. On statin therapy. Labs followed by Dr. Dagmar Hait.

## 2014-10-15 NOTE — Patient Instructions (Signed)
Continue your current therapy  I will see you in 6 months.   

## 2014-10-26 ENCOUNTER — Other Ambulatory Visit: Payer: Self-pay | Admitting: Cardiology

## 2014-10-26 MED ORDER — AMLODIPINE BESYLATE 5 MG PO TABS: ORAL_TABLET | ORAL | Status: DC

## 2014-11-29 DIAGNOSIS — Z23 Encounter for immunization: Secondary | ICD-10-CM | POA: Diagnosis not present

## 2015-03-01 DIAGNOSIS — E039 Hypothyroidism, unspecified: Secondary | ICD-10-CM | POA: Diagnosis not present

## 2015-03-01 DIAGNOSIS — D509 Iron deficiency anemia, unspecified: Secondary | ICD-10-CM | POA: Diagnosis not present

## 2015-03-01 DIAGNOSIS — I2510 Atherosclerotic heart disease of native coronary artery without angina pectoris: Secondary | ICD-10-CM | POA: Diagnosis not present

## 2015-03-01 DIAGNOSIS — S32009A Unspecified fracture of unspecified lumbar vertebra, initial encounter for closed fracture: Secondary | ICD-10-CM | POA: Diagnosis not present

## 2015-03-01 DIAGNOSIS — R0602 Shortness of breath: Secondary | ICD-10-CM | POA: Diagnosis not present

## 2015-03-01 DIAGNOSIS — M810 Age-related osteoporosis without current pathological fracture: Secondary | ICD-10-CM | POA: Diagnosis not present

## 2015-03-01 DIAGNOSIS — I10 Essential (primary) hypertension: Secondary | ICD-10-CM | POA: Diagnosis not present

## 2015-03-01 DIAGNOSIS — Z6833 Body mass index (BMI) 33.0-33.9, adult: Secondary | ICD-10-CM | POA: Diagnosis not present

## 2015-03-01 DIAGNOSIS — C569 Malignant neoplasm of unspecified ovary: Secondary | ICD-10-CM | POA: Diagnosis not present

## 2015-03-01 DIAGNOSIS — Z1389 Encounter for screening for other disorder: Secondary | ICD-10-CM | POA: Diagnosis not present

## 2015-04-24 ENCOUNTER — Ambulatory Visit: Payer: Medicare Other | Admitting: Cardiology

## 2015-04-30 ENCOUNTER — Other Ambulatory Visit: Payer: Self-pay

## 2015-04-30 MED ORDER — AMLODIPINE BESYLATE 5 MG PO TABS: 5.0000 mg | ORAL_TABLET | Freq: Every day | ORAL | Status: DC

## 2015-04-30 NOTE — Telephone Encounter (Signed)
Rx(s) sent to pharmacy electronically.  

## 2015-06-17 DIAGNOSIS — Z1231 Encounter for screening mammogram for malignant neoplasm of breast: Secondary | ICD-10-CM | POA: Diagnosis not present

## 2015-06-19 ENCOUNTER — Telehealth: Payer: Self-pay | Admitting: Adult Health

## 2015-06-19 DIAGNOSIS — R922 Inconclusive mammogram: Secondary | ICD-10-CM | POA: Diagnosis not present

## 2015-06-19 DIAGNOSIS — R928 Other abnormal and inconclusive findings on diagnostic imaging of breast: Secondary | ICD-10-CM | POA: Diagnosis not present

## 2015-06-19 NOTE — Telephone Encounter (Signed)
Per pt she has already spoken with someone from our office. Will sign off

## 2015-08-28 DIAGNOSIS — T84048D Periprosthetic fracture around other internal prosthetic joint, subsequent encounter: Secondary | ICD-10-CM | POA: Diagnosis not present

## 2015-09-03 DIAGNOSIS — I2510 Atherosclerotic heart disease of native coronary artery without angina pectoris: Secondary | ICD-10-CM | POA: Diagnosis not present

## 2015-09-03 DIAGNOSIS — M810 Age-related osteoporosis without current pathological fracture: Secondary | ICD-10-CM | POA: Diagnosis not present

## 2015-09-03 DIAGNOSIS — N390 Urinary tract infection, site not specified: Secondary | ICD-10-CM | POA: Diagnosis not present

## 2015-09-03 DIAGNOSIS — E785 Hyperlipidemia, unspecified: Secondary | ICD-10-CM | POA: Diagnosis not present

## 2015-09-03 DIAGNOSIS — I10 Essential (primary) hypertension: Secondary | ICD-10-CM | POA: Diagnosis not present

## 2015-09-03 DIAGNOSIS — R8299 Other abnormal findings in urine: Secondary | ICD-10-CM | POA: Diagnosis not present

## 2015-09-05 ENCOUNTER — Ambulatory Visit: Payer: Medicare Other | Admitting: Cardiology

## 2015-09-16 DIAGNOSIS — H2513 Age-related nuclear cataract, bilateral: Secondary | ICD-10-CM | POA: Diagnosis not present

## 2015-09-17 DIAGNOSIS — Z23 Encounter for immunization: Secondary | ICD-10-CM | POA: Diagnosis not present

## 2015-09-18 ENCOUNTER — Encounter (HOSPITAL_COMMUNITY): Payer: Self-pay | Admitting: Vascular Surgery

## 2015-09-18 ENCOUNTER — Emergency Department (HOSPITAL_COMMUNITY)
Admission: EM | Admit: 2015-09-18 | Discharge: 2015-09-18 | Disposition: A | Discharge status: Home / Self Care | Payer: Medicare Other | Attending: Emergency Medicine | Admitting: Emergency Medicine

## 2015-09-18 DIAGNOSIS — Z87891 Personal history of nicotine dependence: Secondary | ICD-10-CM | POA: Insufficient documentation

## 2015-09-18 DIAGNOSIS — Z8719 Personal history of other diseases of the digestive system: Secondary | ICD-10-CM | POA: Insufficient documentation

## 2015-09-18 DIAGNOSIS — R112 Nausea with vomiting, unspecified: Secondary | ICD-10-CM

## 2015-09-18 DIAGNOSIS — I10 Essential (primary) hypertension: Secondary | ICD-10-CM | POA: Diagnosis not present

## 2015-09-18 DIAGNOSIS — E039 Hypothyroidism, unspecified: Secondary | ICD-10-CM | POA: Diagnosis not present

## 2015-09-18 DIAGNOSIS — Z8543 Personal history of malignant neoplasm of ovary: Secondary | ICD-10-CM | POA: Insufficient documentation

## 2015-09-18 DIAGNOSIS — Z79899 Other long term (current) drug therapy: Secondary | ICD-10-CM | POA: Diagnosis not present

## 2015-09-18 DIAGNOSIS — N390 Urinary tract infection, site not specified: Secondary | ICD-10-CM | POA: Diagnosis not present

## 2015-09-18 DIAGNOSIS — R197 Diarrhea, unspecified: Secondary | ICD-10-CM | POA: Diagnosis not present

## 2015-09-18 DIAGNOSIS — E669 Obesity, unspecified: Secondary | ICD-10-CM | POA: Diagnosis not present

## 2015-09-18 DIAGNOSIS — Z7982 Long term (current) use of aspirin: Secondary | ICD-10-CM | POA: Insufficient documentation

## 2015-09-18 DIAGNOSIS — E860 Dehydration: Secondary | ICD-10-CM | POA: Diagnosis not present

## 2015-09-18 DIAGNOSIS — M810 Age-related osteoporosis without current pathological fracture: Secondary | ICD-10-CM | POA: Diagnosis not present

## 2015-09-18 DIAGNOSIS — E785 Hyperlipidemia, unspecified: Secondary | ICD-10-CM | POA: Insufficient documentation

## 2015-09-18 DIAGNOSIS — Z8669 Personal history of other diseases of the nervous system and sense organs: Secondary | ICD-10-CM | POA: Diagnosis not present

## 2015-09-18 LAB — URINE MICROSCOPIC-ADD ON

## 2015-09-18 LAB — URINALYSIS, ROUTINE W REFLEX MICROSCOPIC
Glucose, UA: NEGATIVE mg/dL
Ketones, ur: NEGATIVE mg/dL
Nitrite: NEGATIVE
PH: 5 (ref 5.0–8.0)
Protein, ur: NEGATIVE mg/dL
SPECIFIC GRAVITY, URINE: 1.023 (ref 1.005–1.030)
Urobilinogen, UA: 0.2 mg/dL (ref 0.0–1.0)

## 2015-09-18 LAB — CBC
HEMATOCRIT: 40.6 % (ref 36.0–46.0)
Hemoglobin: 13.1 g/dL (ref 12.0–15.0)
MCH: 29 pg (ref 26.0–34.0)
MCHC: 32.3 g/dL (ref 30.0–36.0)
MCV: 90 fL (ref 78.0–100.0)
Platelets: 223 10*3/uL (ref 150–400)
RBC: 4.51 MIL/uL (ref 3.87–5.11)
RDW: 14 % (ref 11.5–15.5)
WBC: 10.6 10*3/uL — ABNORMAL HIGH (ref 4.0–10.5)

## 2015-09-18 LAB — COMPREHENSIVE METABOLIC PANEL
ALBUMIN: 3.7 g/dL (ref 3.5–5.0)
ALT: 21 U/L (ref 14–54)
AST: 32 U/L (ref 15–41)
Alkaline Phosphatase: 68 U/L (ref 38–126)
Anion gap: 13 (ref 5–15)
BILIRUBIN TOTAL: 0.9 mg/dL (ref 0.3–1.2)
BUN: 50 mg/dL — AB (ref 6–20)
CHLORIDE: 104 mmol/L (ref 101–111)
CO2: 24 mmol/L (ref 22–32)
Calcium: 9.3 mg/dL (ref 8.9–10.3)
Creatinine, Ser: 1.64 mg/dL — ABNORMAL HIGH (ref 0.44–1.00)
GFR calc Af Amer: 33 mL/min — ABNORMAL LOW (ref >60)
GFR calc non Af Amer: 28 mL/min — ABNORMAL LOW (ref >60)
GLUCOSE: 135 mg/dL — AB (ref 65–99)
POTASSIUM: 3.8 mmol/L (ref 3.5–5.1)
Sodium: 141 mmol/L (ref 135–145)
TOTAL PROTEIN: 6.9 g/dL (ref 6.5–8.1)

## 2015-09-18 LAB — I-STAT CG4 LACTIC ACID, ED: LACTIC ACID, VENOUS: 2.07 mmol/L — AB (ref 0.5–2.0)

## 2015-09-18 LAB — LIPASE, BLOOD: Lipase: 22 U/L (ref 22–51)

## 2015-09-18 MED ORDER — SODIUM CHLORIDE 0.9 % IV BOLUS (SEPSIS)
1000.0000 mL | Freq: Once | INTRAVENOUS | Status: AC
  Administered 2015-09-18: 1000 mL via INTRAVENOUS

## 2015-09-18 MED ORDER — CEPHALEXIN 500 MG PO CAPS: 500.0000 mg | ORAL_CAPSULE | Freq: Four times a day (QID) | ORAL | Status: DC

## 2015-09-18 MED ORDER — DEXTROSE 5 % IV SOLN
1.0000 g | Freq: Once | INTRAVENOUS | Status: AC
  Administered 2015-09-18: 1 g via INTRAVENOUS
  Filled 2015-09-18: qty 10

## 2015-09-18 MED ORDER — ONDANSETRON 4 MG PO TBDP: 4.0000 mg | ORAL_TABLET | Freq: Once | ORAL | Status: DC | PRN

## 2015-09-18 MED ORDER — SODIUM CHLORIDE 0.9 % IV BOLUS (SEPSIS)
500.0000 mL | Freq: Once | INTRAVENOUS | Status: AC
  Administered 2015-09-18: 500 mL via INTRAVENOUS

## 2015-09-18 NOTE — ED Notes (Addendum)
Pt reports to the ED for eval of productive yellow cough, nasal congestion, and N/V/D since yesterday. She was given a flu shot and reports she has felt this way ever since. Pt denies any blood in her emesis but did note some blood on her bed but is unsure of where it came from. Pt denies any abd pain, fevers, or chills. Pt A&Ox4, resp e/u, skin warm and dry. Able to keep down coca cola.

## 2015-09-18 NOTE — ED Notes (Signed)
Dr. Vanita Panda aware of patients BP

## 2015-09-18 NOTE — ED Notes (Signed)
Pt helped onto bedside commode. Phlebotomy here to draw labs

## 2015-09-18 NOTE — ED Provider Notes (Signed)
  Physical Exam  BP 105/42 mmHg  Pulse 61  Temp(Src) 98.1 F (36.7 C) (Oral)  Resp 18  SpO2 97%  Physical Exam  ED Course  Procedures  MDM Care assumed at sign out. Patient here with vomiting, diarrhea. Nontender abdomen as per Dr. Vanita Panda. Has borderline BP. Sign out pending UA. UA + UTI. Given ceftriaxone. BP improved with IVF. Lactate 2. Urine and blood cultures sent. Appears well despite border BP. Abdomen remained nontender. Offered admission but refused. Will dc home with zofran, keflex.   Results for orders placed or performed during the hospital encounter of 09/18/15  Lipase, blood  Result Value Ref Range   Lipase 22 22 - 51 U/L  Comprehensive metabolic panel  Result Value Ref Range   Sodium 141 135 - 145 mmol/L   Potassium 3.8 3.5 - 5.1 mmol/L   Chloride 104 101 - 111 mmol/L   CO2 24 22 - 32 mmol/L   Glucose, Bld 135 (H) 65 - 99 mg/dL   BUN 50 (H) 6 - 20 mg/dL   Creatinine, Ser 1.64 (H) 0.44 - 1.00 mg/dL   Calcium 9.3 8.9 - 10.3 mg/dL   Total Protein 6.9 6.5 - 8.1 g/dL   Albumin 3.7 3.5 - 5.0 g/dL   AST 32 15 - 41 U/L   ALT 21 14 - 54 U/L   Alkaline Phosphatase 68 38 - 126 U/L   Total Bilirubin 0.9 0.3 - 1.2 mg/dL   GFR calc non Af Amer 28 (L) >60 mL/min   GFR calc Af Amer 33 (L) >60 mL/min   Anion gap 13 5 - 15  CBC  Result Value Ref Range   WBC 10.6 (H) 4.0 - 10.5 K/uL   RBC 4.51 3.87 - 5.11 MIL/uL   Hemoglobin 13.1 12.0 - 15.0 g/dL   HCT 40.6 36.0 - 46.0 %   MCV 90.0 78.0 - 100.0 fL   MCH 29.0 26.0 - 34.0 pg   MCHC 32.3 30.0 - 36.0 g/dL   RDW 14.0 11.5 - 15.5 %   Platelets 223 150 - 400 K/uL  Urinalysis, Routine w reflex microscopic (not at [facility])  Result Value Ref Range   Color, Urine YELLOW YELLOW   APPearance CLOUDY (A) CLEAR   Specific Gravity, Urine 1.023 1.005 - 1.030   pH 5.0 5.0 - 8.0   Glucose, UA NEGATIVE NEGATIVE mg/dL   Hgb urine dipstick MODERATE (A) NEGATIVE   Bilirubin Urine SMALL (A) NEGATIVE   Ketones, ur NEGATIVE NEGATIVE mg/dL    Protein, ur NEGATIVE NEGATIVE mg/dL   Urobilinogen, UA 0.2 0.0 - 1.0 mg/dL   Nitrite NEGATIVE NEGATIVE   Leukocytes, UA LARGE (A) NEGATIVE  Urine microscopic-add on  Result Value Ref Range   Squamous Epithelial / LPF FEW (A) RARE   WBC, UA TOO NUMEROUS TO COUNT <3 WBC/hpf   RBC / HPF 0-2 <3 RBC/hpf   Bacteria, UA MANY (A) RARE   Casts GRANULAR CAST (A) NEGATIVE   Urine-Other MUCOUS PRESENT   I-Stat CG4 Lactic Acid, ED  Result Value Ref Range   Lactic Acid, Venous 2.07 (HH) 0.5 - 2.0 mmol/L   Comment NOTIFIED PHYSICIAN    No results found.    Wandra Arthurs, MD 09/18/15 2052

## 2015-09-18 NOTE — ED Notes (Signed)
Denies any nausea currently will hold zofran until needed

## 2015-09-18 NOTE — Discharge Instructions (Signed)
As discussed, today's evaluation has been largely notable for demonstration of dehydration.  Although you have improved, and is important that you have a repeat evaluation with your primary care physician to insure that you are adequately recovered.  Take keflex for UTI.   Take zofran for nausea.   Return here for abdominal pain, passing out, vomiting, fever, back pain, trouble urinating.

## 2015-09-18 NOTE — ED Provider Notes (Signed)
CSN: [id number]     Arrival date & time 09/18/15  1139 History   First MD Initiated Contact with Patient 09/18/15 1410     Chief Complaint  Patient presents with  . Emesis     HPI  Patient presents with concern of nausea, vomiting, diarrhea, weakness. Symptoms began one day ago, since onset have been persistent. No confusion, disorientation, syncope No falling. Patient has no fever. No sick contacts. No abdominal pain at all. Since onset, no clear alleviating or exacerbating factors.     Past Medical History  Diagnosis Date  . Ovarian cancer     Stage IA grade 2 ovarian cancer  . Obesity   . Hypertension   . Macular degeneration   . Hyperlipidemia   . Hypothyroidism   . Hiatal hernia   . Osteoporosis    Past Surgical History  Procedure Laterality Date  . Elbow arthroplasty      replacing the humeral stem  . Cholecystectomy, laparoscopic    . Elbow replacement    . Colectomy       Rectosigmoid colectomy with reanastomosis  . Other surgical history    . Other surgical history      Ovarian cancer resection and staging  . Kyphoplasty N/A 03/02/2013    Procedure: KYPHOPLASTY;  Surgeon: Erline Levine, MD;  Location: [place] [facility];  Service: Neurosurgery;  Laterality: N/A;  Lumbar One Kyphoplasty   Family History  Problem Relation Age of Onset  . Heart attack Father    Social History  Substance Use Topics  . Smoking status: Former Research scientist (life sciences)  . Smokeless tobacco: None  . Alcohol Use: Yes     Comment: occasionally   OB History    No data available     Review of Systems  Constitutional:       Per HPI, otherwise negative  HENT:       Per HPI, otherwise negative  Respiratory:       Per HPI, otherwise negative  Cardiovascular:       Per HPI, otherwise negative  Gastrointestinal: Positive for nausea, vomiting and diarrhea. Negative for abdominal pain and abdominal distention.  Endocrine:       Negative aside from HPI  Genitourinary:       Neg aside from HPI    Musculoskeletal:       Per HPI, otherwise negative  Skin: Negative.   Neurological: Negative for syncope.      Allergies  Review of patient's allergies indicates no known allergies.  Home Medications   Prior to Admission medications   Medication Sig Start Date End Date Taking? Authorizing Provider  Alendronate Sodium (FOSAMAX PO) Take 70 mg by mouth once a week.     Historical Provider, MD  amLODipine (NORVASC) 5 MG tablet Take 1 tablet (5 mg total) by mouth daily. 04/30/15   Peter M Martinique, MD  aspirin 81 MG tablet Take 81 mg by mouth daily.    Historical Provider, MD  beta carotene w/minerals (OCUVITE) tablet Take 1 tablet by mouth daily.    Historical Provider, MD  bisacodyl (DULCOLAX) 10 MG suppository Place 10 mg rectally daily as needed for constipation.    Historical Provider, MD  docusate sodium 100 MG CAPS Take 100 mg by mouth 2 (two) times daily as needed for constipation. For constipation 03/06/13   Ripudeep Krystal Eaton, MD  hydrochlorothiazide (HYDRODIURIL) 25 MG tablet Take 1 tablet (25 mg total) by mouth daily. 12/09/12   Peter M Martinique, MD  irbesartan [place])  150 MG tablet Take 1 tablet (150 mg total) by mouth at bedtime. 12/09/12   Peter M Martinique, MD  levothyroxine (SYNTHROID, LEVOTHROID) 200 MCG tablet Take 200 mcg by mouth daily.  08/28/11   Historical Provider, MD  metoprolol tartrate (LOPRESSOR) 25 MG tablet Take 1 tablet (25 mg total) by mouth 2 (two) times daily. 09/04/14   Peter M Martinique, MD  pantoprazole (PROTONIX) 40 MG tablet Take 40 mg by mouth daily.    Historical Provider, MD  polyethylene glycol (MIRALAX / GLYCOLAX) packet Take 17 g by mouth daily as needed. 03/06/13   Ripudeep Krystal Eaton, MD  rosuvastatin (CRESTOR) 20 MG tablet Take 20 mg by mouth daily.    Historical Provider, MD  senna (SENOKOT) 8.6 MG tablet Take 1 tablet by mouth daily as needed for constipation.    Historical Provider, MD  Teriparatide, Recombinant, (FORTEO [place]) Inject into the skin as directed.     Historical Provider, MD  TRAMADOL HCL PO Take by mouth as directed.    Historical Provider, MD   BP 92/37 mmHg  Pulse 71  Temp(Src) 98.1 F (36.7 C) (Oral)  Resp 18  SpO2 95% Physical Exam  Constitutional: She is oriented to person, place, and time. She has a sickly appearance. No distress.  HENT:  Head: Normocephalic and atraumatic.  Eyes: Conjunctivae and EOM are normal.  Cardiovascular: Normal rate and regular rhythm.   Pulmonary/Chest: Effort normal and breath sounds normal. No stridor. No respiratory distress.  Abdominal: Soft. Bowel sounds are normal. She exhibits no distension and no mass. There is no tenderness. There is no rebound and no guarding.  Musculoskeletal: She exhibits no edema.  Neurological: She is alert and oriented to person, place, and time. No cranial nerve deficit.  Skin: Skin is warm and dry.  Psychiatric: She has a normal mood and affect.  Nursing note and vitals reviewed.   ED Course  Procedures (including critical care time) Labs Review Labs Reviewed  COMPREHENSIVE METABOLIC PANEL - Abnormal; Notable for the following:    Glucose, Bld 135 (*)    BUN 50 (*)    Creatinine, Ser 1.64 (*)    GFR calc non Af Amer 28 (*)    GFR calc Af Amer 33 (*)    All other components within normal limits  CBC - Abnormal; Notable for the following:    WBC 10.6 (*)    All other components within normal limits  LIPASE, BLOOD  URINALYSIS, ROUTINE W REFLEX MICROSCOPIC (NOT AT [facility])    Imaging Review No results found. I have personally reviewed and evaluated these images and lab results as part of my medical decision-making.  4:29 PM Patient feels "remarkably better." Urinalysis pending  MDM  Patient presents with one day of nausea, vomiting, diarrhea, weakness per Patient has no abdominal pain, denies any pain at all. Patient is initially weak appearing, but with fluid resuscitation she improves Patient's initial labs notable for mild lactic acidosis,  creatinine elevation consistent with dehydration. Patient's syncytial improvement with IV fluids was reassuring, but on sign out, the patient urinalysis pending.   Carmin Muskrat, MD 09/19/15 2239

## 2015-09-20 LAB — URINE CULTURE

## 2015-09-23 LAB — CULTURE, BLOOD (ROUTINE X 2)
CULTURE: NO GROWTH
Culture: NO GROWTH

## 2015-09-24 ENCOUNTER — Ambulatory Visit: Payer: Medicare Other | Admitting: Cardiology

## 2015-09-24 ENCOUNTER — Other Ambulatory Visit: Payer: Self-pay

## 2015-09-26 ENCOUNTER — Encounter: Payer: Self-pay | Admitting: Cardiology

## 2015-09-26 ENCOUNTER — Ambulatory Visit (INDEPENDENT_AMBULATORY_CARE_PROVIDER_SITE_OTHER): Payer: Medicare Other | Admitting: Cardiology

## 2015-09-26 VITALS — BP 160/68 | HR 48 | Ht 59.0 in | Wt 171.3 lb

## 2015-09-26 DIAGNOSIS — I4510 Unspecified right bundle-branch block: Secondary | ICD-10-CM

## 2015-09-26 DIAGNOSIS — R001 Bradycardia, unspecified: Secondary | ICD-10-CM | POA: Diagnosis not present

## 2015-09-26 DIAGNOSIS — I10 Essential (primary) hypertension: Secondary | ICD-10-CM

## 2015-09-26 NOTE — Patient Instructions (Signed)
Continue your current therapy  Watch your blood pressure- let me know if it is staying over 620 systolic  I will see you in one year

## 2015-09-27 NOTE — Progress Notes (Signed)
[name] Date of Birth: February 25, 1931 Medical Record #[id number]  History of Present Illness: [name] is seen for follow up. She has a history of hypertension, hyperlipidemia, and right bundle branch block. She's had previous left elbow replacement with revision in 2011. She now has complete dislocation of her elbow prosthesis.  On follow up she has been feeling well. She denies any chest pain.  She denies any palpitations. Really no new complaints.   Current Outpatient Prescriptions on File Prior to Visit  Medication Sig Dispense Refill  . Alendronate Sodium (FOSAMAX PO) Take 70 mg by mouth once a week.     Marland Kitchen amLODipine (NORVASC) 5 MG tablet Take 1 tablet (5 mg total) by mouth daily. 30 tablet 5  . aspirin 81 MG tablet Take 81 mg by mouth daily.    . beta carotene w/minerals (OCUVITE) tablet Take 1 tablet by mouth daily.    . bisacodyl (DULCOLAX) 10 MG suppository Place 10 mg rectally daily as needed for constipation.    . docusate sodium 100 MG CAPS Take 100 mg by mouth 2 (two) times daily as needed for constipation. For constipation 60 capsule 3  . hydrochlorothiazide (HYDRODIURIL) 25 MG tablet Take 1 tablet (25 mg total) by mouth daily. 90 tablet 3  . irbesartan (AVAPRO) 150 MG tablet Take 1 tablet (150 mg total) by mouth at bedtime.    Marland Kitchen levothyroxine (SYNTHROID, LEVOTHROID) 150 MCG tablet Take 150 mcg by mouth daily.  11  . metoprolol tartrate (LOPRESSOR) 25 MG tablet Take 1 tablet (25 mg total) by mouth 2 (two) times daily. 60 tablet 0  . ondansetron (ZOFRAN-ODT) 4 MG disintegrating tablet Take 1 tablet (4 mg total) by mouth once as needed for nausea. 20 tablet 0  . pantoprazole (PROTONIX) 40 MG tablet Take 40 mg by mouth daily.    . polyethylene glycol (MIRALAX / GLYCOLAX) packet Take 17 g by mouth daily as needed. 30 each 0  . rosuvastatin (CRESTOR) 20 MG tablet Take 20 mg by mouth daily.    Marland Kitchen senna (SENOKOT) 8.6 MG tablet Take 1 tablet by mouth daily as needed for  constipation.    . Teriparatide, Recombinant, (FORTEO [place]) Inject into the skin as directed.    . traMADol (ULTRAM) 50 MG tablet TAKE 1 TABLET EVERY 4 HOURS  0   No current facility-administered medications on file prior to visit.    No Known Allergies  Past Medical History  Diagnosis Date  . Ovarian cancer ([place])     Stage IA grade 2 ovarian cancer  . Obesity   . Hypertension   . Macular degeneration   . Hyperlipidemia   . Hypothyroidism   . Hiatal hernia   . Osteoporosis     Past Surgical History  Procedure Laterality Date  . Elbow arthroplasty      replacing the humeral stem  . Cholecystectomy, laparoscopic    . Elbow replacement    . Colectomy       Rectosigmoid colectomy with reanastomosis  . Other surgical history    . Other surgical history      Ovarian cancer resection and staging  . Kyphoplasty N/A 03/02/2013    Procedure: KYPHOPLASTY;  Surgeon: Erline Levine, MD;  Location: [facility];  Service: Neurosurgery;  Laterality: N/A;  Lumbar One Kyphoplasty    History  Smoking status  . Former Smoker  Smokeless tobacco  . Not on file    History  Alcohol Use  . 0.0 oz/week  .  0 Standard drinks or equivalent per week    Comment: occasionally    Family History  Problem Relation Age of Onset  . Heart attack Father     Review of Systems: As noted in history of present illness.  All other systems were reviewed and are negative.  Physical Exam: BP 160/68 mmHg  Pulse 48  Ht 4\' 11"  (1.499 m)  Wt 77.701 kg (171 lb 4.8 oz)  BMI 34.58 kg/m2 She is an obese, elderly female in no acute distress. The HEENT exam is normal. The carotids are 2+ without bruits.  There is no thyromegaly.  There is no JVD.  The lungs are clear.   The heart exam reveals a regular rate with a normal S1 and S2.  There are no murmurs, gallops, or rubs.  The PMI is not displaced.     Exam of the legs reveal no clubbing, cyanosis, or edema. Her left elbow prosthesis is dislocated. The distal  pulses are intact.  Cranial nerves II - XII are intact.  She uses a cane to walk.  LABORATORY DATA: Ecg today shows sinus bradycardia with rate 48. RBBB. No acute change. I have personally reviewed and interpreted this study.   Assessment / Plan: 1. Hypertension. Blood pressure is elevated today but has been under good control in the past. She is on multiple meds. Continue current medical therapy. I have asked her to monitor her BP at home and let me know if it is persistently higher than 161 systolic.   2. Chronic right bundle branch block.  3. Dislocation of prior elbow or placement.   4. Hyperlipidemia. On statin therapy. Labs followed by Dr. Dagmar Hait.

## 2015-10-02 DIAGNOSIS — M810 Age-related osteoporosis without current pathological fracture: Secondary | ICD-10-CM | POA: Diagnosis not present

## 2015-10-02 DIAGNOSIS — I10 Essential (primary) hypertension: Secondary | ICD-10-CM | POA: Diagnosis not present

## 2015-10-02 DIAGNOSIS — Z6832 Body mass index (BMI) 32.0-32.9, adult: Secondary | ICD-10-CM | POA: Diagnosis not present

## 2015-10-02 DIAGNOSIS — Z87440 Personal history of urinary (tract) infections: Secondary | ICD-10-CM | POA: Diagnosis not present

## 2015-10-11 DIAGNOSIS — Z1212 Encounter for screening for malignant neoplasm of rectum: Secondary | ICD-10-CM | POA: Diagnosis not present

## 2015-11-18 ENCOUNTER — Other Ambulatory Visit: Payer: Self-pay | Admitting: *Deleted

## 2015-11-18 MED ORDER — AMLODIPINE BESYLATE 5 MG PO TABS: 5.0000 mg | ORAL_TABLET | Freq: Every day | ORAL | Status: DC

## 2016-01-09 DIAGNOSIS — N183 Chronic kidney disease, stage 3 (moderate): Secondary | ICD-10-CM | POA: Diagnosis not present

## 2016-01-09 DIAGNOSIS — R8299 Other abnormal findings in urine: Secondary | ICD-10-CM | POA: Diagnosis not present

## 2016-01-09 DIAGNOSIS — N390 Urinary tract infection, site not specified: Secondary | ICD-10-CM | POA: Diagnosis not present

## 2016-01-09 DIAGNOSIS — E784 Other hyperlipidemia: Secondary | ICD-10-CM | POA: Diagnosis not present

## 2016-01-09 DIAGNOSIS — I2510 Atherosclerotic heart disease of native coronary artery without angina pectoris: Secondary | ICD-10-CM | POA: Diagnosis not present

## 2016-01-09 DIAGNOSIS — E038 Other specified hypothyroidism: Secondary | ICD-10-CM | POA: Diagnosis not present

## 2016-01-09 DIAGNOSIS — M810 Age-related osteoporosis without current pathological fracture: Secondary | ICD-10-CM | POA: Diagnosis not present

## 2016-01-10 DIAGNOSIS — Z6833 Body mass index (BMI) 33.0-33.9, adult: Secondary | ICD-10-CM | POA: Diagnosis not present

## 2016-01-10 DIAGNOSIS — K449 Diaphragmatic hernia without obstruction or gangrene: Secondary | ICD-10-CM | POA: Diagnosis not present

## 2016-01-10 DIAGNOSIS — H3530 Unspecified macular degeneration: Secondary | ICD-10-CM | POA: Diagnosis not present

## 2016-01-10 DIAGNOSIS — Z1389 Encounter for screening for other disorder: Secondary | ICD-10-CM | POA: Diagnosis not present

## 2016-01-10 DIAGNOSIS — E668 Other obesity: Secondary | ICD-10-CM | POA: Diagnosis not present

## 2016-01-10 DIAGNOSIS — Z0000 Encounter for general adult medical examination without abnormal findings: Secondary | ICD-10-CM | POA: Diagnosis not present

## 2016-01-10 DIAGNOSIS — I10 Essential (primary) hypertension: Secondary | ICD-10-CM | POA: Diagnosis not present

## 2016-01-10 DIAGNOSIS — E038 Other specified hypothyroidism: Secondary | ICD-10-CM | POA: Diagnosis not present

## 2016-01-10 DIAGNOSIS — I2510 Atherosclerotic heart disease of native coronary artery without angina pectoris: Secondary | ICD-10-CM | POA: Diagnosis not present

## 2016-01-10 DIAGNOSIS — N183 Chronic kidney disease, stage 3 (moderate): Secondary | ICD-10-CM | POA: Diagnosis not present

## 2016-01-10 DIAGNOSIS — E784 Other hyperlipidemia: Secondary | ICD-10-CM | POA: Diagnosis not present

## 2016-01-10 DIAGNOSIS — K429 Umbilical hernia without obstruction or gangrene: Secondary | ICD-10-CM | POA: Diagnosis not present

## 2016-01-14 DIAGNOSIS — N63 Unspecified lump in breast: Secondary | ICD-10-CM | POA: Diagnosis not present

## 2016-07-27 DIAGNOSIS — R413 Other amnesia: Secondary | ICD-10-CM | POA: Diagnosis not present

## 2016-07-27 DIAGNOSIS — Z6833 Body mass index (BMI) 33.0-33.9, adult: Secondary | ICD-10-CM | POA: Diagnosis not present

## 2016-08-28 DIAGNOSIS — Z1231 Encounter for screening mammogram for malignant neoplasm of breast: Secondary | ICD-10-CM | POA: Diagnosis not present

## 2016-12-24 ENCOUNTER — Other Ambulatory Visit: Payer: Self-pay

## 2016-12-24 MED ORDER — AMLODIPINE BESYLATE 5 MG PO TABS: 5.0000 mg | ORAL_TABLET | Freq: Every day | ORAL | 0 refills | Status: DC

## 2016-12-25 DIAGNOSIS — Z23 Encounter for immunization: Secondary | ICD-10-CM | POA: Diagnosis not present

## 2017-01-22 DIAGNOSIS — M810 Age-related osteoporosis without current pathological fracture: Secondary | ICD-10-CM | POA: Diagnosis not present

## 2017-01-22 DIAGNOSIS — I10 Essential (primary) hypertension: Secondary | ICD-10-CM | POA: Diagnosis not present

## 2017-01-22 DIAGNOSIS — E038 Other specified hypothyroidism: Secondary | ICD-10-CM | POA: Diagnosis not present

## 2017-01-22 DIAGNOSIS — R8299 Other abnormal findings in urine: Secondary | ICD-10-CM | POA: Diagnosis not present

## 2017-01-22 DIAGNOSIS — E784 Other hyperlipidemia: Secondary | ICD-10-CM | POA: Diagnosis not present

## 2017-01-29 DIAGNOSIS — I2510 Atherosclerotic heart disease of native coronary artery without angina pectoris: Secondary | ICD-10-CM | POA: Diagnosis not present

## 2017-01-29 DIAGNOSIS — Z6834 Body mass index (BMI) 34.0-34.9, adult: Secondary | ICD-10-CM | POA: Diagnosis not present

## 2017-01-29 DIAGNOSIS — Z0000 Encounter for general adult medical examination without abnormal findings: Secondary | ICD-10-CM | POA: Diagnosis not present

## 2017-01-29 DIAGNOSIS — M1990 Unspecified osteoarthritis, unspecified site: Secondary | ICD-10-CM | POA: Diagnosis not present

## 2017-01-29 DIAGNOSIS — K449 Diaphragmatic hernia without obstruction or gangrene: Secondary | ICD-10-CM | POA: Diagnosis not present

## 2017-01-29 DIAGNOSIS — E038 Other specified hypothyroidism: Secondary | ICD-10-CM | POA: Diagnosis not present

## 2017-01-29 DIAGNOSIS — E784 Other hyperlipidemia: Secondary | ICD-10-CM | POA: Diagnosis not present

## 2017-01-29 DIAGNOSIS — Z1389 Encounter for screening for other disorder: Secondary | ICD-10-CM | POA: Diagnosis not present

## 2017-01-29 DIAGNOSIS — F0390 Unspecified dementia without behavioral disturbance: Secondary | ICD-10-CM | POA: Diagnosis not present

## 2017-01-29 DIAGNOSIS — N183 Chronic kidney disease, stage 3 (moderate): Secondary | ICD-10-CM | POA: Diagnosis not present

## 2017-01-29 DIAGNOSIS — E669 Obesity, unspecified: Secondary | ICD-10-CM | POA: Diagnosis not present

## 2017-01-29 DIAGNOSIS — I10 Essential (primary) hypertension: Secondary | ICD-10-CM | POA: Diagnosis not present

## 2017-02-10 ENCOUNTER — Other Ambulatory Visit: Payer: Self-pay | Admitting: Cardiology

## 2017-03-14 ENCOUNTER — Other Ambulatory Visit: Payer: Self-pay | Admitting: Cardiology

## 2017-03-29 DIAGNOSIS — F0390 Unspecified dementia without behavioral disturbance: Secondary | ICD-10-CM | POA: Diagnosis not present

## 2017-03-29 DIAGNOSIS — E038 Other specified hypothyroidism: Secondary | ICD-10-CM | POA: Diagnosis not present

## 2017-04-02 ENCOUNTER — Other Ambulatory Visit: Payer: Self-pay | Admitting: Cardiology

## 2017-04-06 ENCOUNTER — Other Ambulatory Visit: Payer: Self-pay | Admitting: Cardiology

## 2017-04-08 ENCOUNTER — Telehealth: Payer: Self-pay | Admitting: Cardiology

## 2017-04-08 NOTE — Telephone Encounter (Signed)
New message     *STAT* If patient is at the pharmacy, call can be transferred to refill team.   1. Which medications need to be refilled? (please list name of each medication and dose if known) amLODipine (NORVASC) 5 MG tablet  2. Which pharmacy/location (including street and city if local pharmacy) is medication to be sent to? [id number] [place], [place] - [place]. AT [address]  3. Do they need a 30 day or 90 day supply? 90 day supply

## 2017-04-09 MED ORDER — AMLODIPINE BESYLATE 5 MG PO TABS: 5.0000 mg | ORAL_TABLET | Freq: Every day | ORAL | 0 refills | Status: DC

## 2017-04-09 NOTE — Telephone Encounter (Signed)
Rx(s) sent to pharmacy electronically.  

## 2017-04-29 ENCOUNTER — Other Ambulatory Visit: Payer: Self-pay | Admitting: Cardiology

## 2017-04-29 NOTE — Telephone Encounter (Signed)
REFILL 

## 2017-05-10 DIAGNOSIS — Z6834 Body mass index (BMI) 34.0-34.9, adult: Secondary | ICD-10-CM | POA: Diagnosis not present

## 2017-05-10 DIAGNOSIS — F0390 Unspecified dementia without behavioral disturbance: Secondary | ICD-10-CM | POA: Diagnosis not present

## 2017-05-10 DIAGNOSIS — I10 Essential (primary) hypertension: Secondary | ICD-10-CM | POA: Diagnosis not present

## 2017-05-10 DIAGNOSIS — L438 Other lichen planus: Secondary | ICD-10-CM | POA: Diagnosis not present

## 2017-05-10 DIAGNOSIS — H3530 Unspecified macular degeneration: Secondary | ICD-10-CM | POA: Diagnosis not present

## 2017-05-10 DIAGNOSIS — E038 Other specified hypothyroidism: Secondary | ICD-10-CM | POA: Diagnosis not present

## 2017-05-10 DIAGNOSIS — R143 Flatulence: Secondary | ICD-10-CM | POA: Diagnosis not present

## 2017-06-11 ENCOUNTER — Encounter: Payer: Self-pay | Admitting: *Deleted

## 2017-06-22 ENCOUNTER — Telehealth: Payer: Self-pay | Admitting: Cardiology

## 2017-06-22 ENCOUNTER — Other Ambulatory Visit: Payer: Self-pay | Admitting: Cardiology

## 2017-06-23 NOTE — Progress Notes (Deleted)
[name] Date of Birth: 1931/03/19 Medical Record #[id number]  History of Present Illness: [name] is seen for follow up. She has a history of hypertension, hyperlipidemia, and right bundle branch block. She's had previous left elbow replacement with revision in 2011. She now has complete dislocation of her elbow prosthesis.  On follow up she has been feeling well. She denies any chest pain.  She denies any palpitations. Really no new complaints.   Current Outpatient Prescriptions on File Prior to Visit  Medication Sig Dispense Refill  . Alendronate Sodium (FOSAMAX PO) Take 70 mg by mouth once a week.     Marland Kitchen amLODipine (NORVASC) 5 MG tablet TAKE 1 TABLET (5 MG TOTAL) BY MOUTH DAILY. KEEP OV. 30 tablet 0  . aspirin 81 MG tablet Take 81 mg by mouth daily.    . beta carotene w/minerals (OCUVITE) tablet Take 1 tablet by mouth daily.    . bisacodyl (DULCOLAX) 10 MG suppository Place 10 mg rectally daily as needed for constipation.    . docusate sodium 100 MG CAPS Take 100 mg by mouth 2 (two) times daily as needed for constipation. For constipation 60 capsule 3  . hydrochlorothiazide (HYDRODIURIL) 25 MG tablet Take 1 tablet (25 mg total) by mouth daily. 90 tablet 3  . irbesartan (AVAPRO) 150 MG tablet Take 1 tablet (150 mg total) by mouth at bedtime.    Marland Kitchen levothyroxine (SYNTHROID, LEVOTHROID) 150 MCG tablet Take 150 mcg by mouth daily.  11  . levothyroxine (SYNTHROID, LEVOTHROID) 150 MCG tablet Take 150 mcg by mouth daily before breakfast.    . metoprolol tartrate (LOPRESSOR) 25 MG tablet Take 1 tablet (25 mg total) by mouth 2 (two) times daily. 60 tablet 0  . ondansetron (ZOFRAN-ODT) 4 MG disintegrating tablet Take 1 tablet (4 mg total) by mouth once as needed for nausea. 20 tablet 0  . pantoprazole (PROTONIX) 40 MG tablet Take 40 mg by mouth daily.    . polyethylene glycol (MIRALAX / GLYCOLAX) packet Take 17 g by mouth daily as needed. 30 each 0  . rosuvastatin (CRESTOR) 20 MG tablet  Take 20 mg by mouth daily.    Marland Kitchen senna (SENOKOT) 8.6 MG tablet Take 1 tablet by mouth daily as needed for constipation.    . Teriparatide, Recombinant, (FORTEO [place]) Inject into the skin as directed.    . traMADol (ULTRAM) 50 MG tablet TAKE 1 TABLET EVERY 4 HOURS  0   No current facility-administered medications on file prior to visit.     No Known Allergies  Past Medical History:  Diagnosis Date  . Hiatal hernia   . Hyperlipidemia   . Hypertension   . Hypothyroidism   . Macular degeneration   . Obesity   . Osteoporosis   . Ovarian cancer ([place])    Stage IA grade 2 ovarian cancer    Past Surgical History:  Procedure Laterality Date  . CHOLECYSTECTOMY, LAPAROSCOPIC    . colectomy      Rectosigmoid colectomy with reanastomosis  . ELBOW ARTHROPLASTY     replacing the humeral stem  . elbow replacement    . KYPHOPLASTY N/A 03/02/2013   Procedure: KYPHOPLASTY;  Surgeon: Erline Levine, MD;  Location: [facility];  Service: Neurosurgery;  Laterality: N/A;  Lumbar One Kyphoplasty  . OTHER SURGICAL HISTORY    . OTHER SURGICAL HISTORY     Ovarian cancer resection and staging    History  Smoking Status  . Former Smoker  Smokeless Tobacco  .  Never Used    History  Alcohol Use  . 0.0 oz/week    Comment: occasionally    Family History  Problem Relation Age of Onset  . Heart attack Father     Review of Systems: As noted in history of present illness.  All other systems were reviewed and are negative.  Physical Exam: There were no vitals taken for this visit. She is an obese, elderly female in no acute distress. The HEENT exam is normal. The carotids are 2+ without bruits.  There is no thyromegaly.  There is no JVD.  The lungs are clear.   The heart exam reveals a regular rate with a normal S1 and S2.  There are no murmurs, gallops, or rubs.  The PMI is not displaced.     Exam of the legs reveal no clubbing, cyanosis, or edema. Her left elbow prosthesis is dislocated. The  distal pulses are intact.  Cranial nerves II - XII are intact.  She uses a cane to walk.  LABORATORY DATA: Labs dated 01/22/17: cholesterol 205, triglycerides 84, HDL 54, LDL 134. CMET and TSH normal.  Ecg today shows sinus bradycardia with rate 48. RBBB. No acute change. I have personally reviewed and interpreted this study.   Assessment / Plan: 1. Hypertension. Blood pressure is elevated today but has been under good control in the past. She is on multiple meds. Continue current medical therapy. I have asked her to monitor her BP at home and let me know if it is persistently higher than 977 systolic.   2. Chronic right bundle branch block.  3. Dislocation of prior elbow or placement.   4. Hyperlipidemia. On statin therapy. Labs followed by Dr. Dagmar Hait.

## 2017-06-24 ENCOUNTER — Ambulatory Visit: Payer: Self-pay | Admitting: Cardiology

## 2017-07-01 ENCOUNTER — Ambulatory Visit: Payer: Self-pay | Admitting: Cardiology

## 2017-07-03 NOTE — Progress Notes (Signed)
[name] Date of Birth: [birth year] Medical Record #[id number]  History of Present Illness: [name] is seen for follow up. She has a history of hypertension, hyperlipidemia, and right bundle branch block. She's had previous left elbow replacement with revision in 2011. She now has complete dislocation of her elbow prosthesis.  On follow up she has been feeling well. She denies any chest pain, palpitations, SOB or fatigue. No dizziness. She has adjusted well to her husband's passing this year. Son is staying with her. Notes mild dementia.  Current Outpatient Prescriptions on File Prior to Visit  Medication Sig Dispense Refill  . Alendronate Sodium (FOSAMAX PO) Take 70 mg by mouth once a week.     Marland Kitchen amLODipine (NORVASC) 5 MG tablet TAKE 1 TABLET (5 MG TOTAL) BY MOUTH DAILY. KEEP OV. 30 tablet 0  . aspirin 81 MG tablet Take 81 mg by mouth daily.    . beta carotene w/minerals (OCUVITE) tablet Take 1 tablet by mouth daily.    . bisacodyl (DULCOLAX) 10 MG suppository Place 10 mg rectally daily as needed for constipation.    . docusate sodium 100 MG CAPS Take 100 mg by mouth 2 (two) times daily as needed for constipation. For constipation 60 capsule 3  . hydrochlorothiazide (HYDRODIURIL) 25 MG tablet Take 1 tablet (25 mg total) by mouth daily. 90 tablet 3  . irbesartan (AVAPRO) 150 MG tablet Take 1 tablet (150 mg total) by mouth at bedtime.    Marland Kitchen levothyroxine (SYNTHROID, LEVOTHROID) 150 MCG tablet Take 150 mcg by mouth daily.  11  . levothyroxine (SYNTHROID, LEVOTHROID) 150 MCG tablet Take 150 mcg by mouth daily before breakfast.    . ondansetron (ZOFRAN-ODT) 4 MG disintegrating tablet Take 1 tablet (4 mg total) by mouth once as needed for nausea. 20 tablet 0  . pantoprazole (PROTONIX) 40 MG tablet Take 40 mg by mouth daily.    . polyethylene glycol (MIRALAX / GLYCOLAX) packet Take 17 g by mouth daily as needed. 30 each 0  . rosuvastatin (CRESTOR) 20 MG tablet Take 20 mg by mouth daily.     Marland Kitchen senna (SENOKOT) 8.6 MG tablet Take 1 tablet by mouth daily as needed for constipation.    . Teriparatide, Recombinant, (FORTEO [place]) Inject into the skin as directed.    . traMADol (ULTRAM) 50 MG tablet TAKE 1 TABLET EVERY 4 HOURS  0   No current facility-administered medications on file prior to visit.     No Known Allergies  Past Medical History:  Diagnosis Date  . Hiatal hernia   . Hyperlipidemia   . Hypertension   . Hypothyroidism   . Macular degeneration   . Obesity   . Osteoporosis   . Ovarian cancer ([place])    Stage IA grade 2 ovarian cancer    Past Surgical History:  Procedure Laterality Date  . CHOLECYSTECTOMY, LAPAROSCOPIC    . colectomy      Rectosigmoid colectomy with reanastomosis  . ELBOW ARTHROPLASTY     replacing the humeral stem  . elbow replacement    . KYPHOPLASTY N/A 03/02/2013   Procedure: KYPHOPLASTY;  Surgeon: Erline Levine, MD;  Location: [facility];  Service: Neurosurgery;  Laterality: N/A;  Lumbar One Kyphoplasty  . OTHER SURGICAL HISTORY    . OTHER SURGICAL HISTORY     Ovarian cancer resection and staging    History  Smoking Status  . Former Smoker  Smokeless Tobacco  . Never Used    History  Alcohol Use  .  0.0 oz/week    Comment: occasionally    Family History  Problem Relation Age of Onset  . Heart attack Father     Review of Systems: As noted in history of present illness.  All other systems were reviewed and are negative.  Physical Exam: BP (!) 102/48   Pulse (!) 45   Ht 4\' 11"  (1.499 m)   Wt 167 lb 9.6 oz (76 kg)   BMI 33.85 kg/m  She is an obese, elderly female in no acute distress. The HEENT exam is normal. The carotids are 2+ without bruits.  There is no thyromegaly.  There is no JVD.  The lungs are clear.   The heart exam reveals a regular rate with a normal S1 and S2.  There are no murmurs, gallops, or rubs.  The PMI is not displaced.     Exam of the legs reveal no clubbing, cyanosis, or edema. Her left elbow  prosthesis is dislocated. The distal pulses are intact.  Cranial nerves II - XII are intact.  She uses a cane to walk.  LABORATORY DATA: Ecg today shows sinus bradycardia with rate 45. RBBB. No acute change. I have personally reviewed and interpreted this study.   Assessment / Plan: 1. Hypertension. Blood pressure is on the low side toay. She is also severely bradycardic. She is on multiple meds. We will discontinue metoprolol today. Continue her other therapy. Follow up in one year.   2. Chronic right bundle branch block.  3. Dislocation of prior elbow or placement.   4. Hyperlipidemia. On statin therapy. Labs followed by Dr. Dagmar Hait.

## 2017-07-05 ENCOUNTER — Ambulatory Visit (INDEPENDENT_AMBULATORY_CARE_PROVIDER_SITE_OTHER): Payer: Medicare Other | Admitting: Cardiology

## 2017-07-05 ENCOUNTER — Encounter: Payer: Self-pay | Admitting: Cardiology

## 2017-07-05 VITALS — BP 102/48 | HR 45 | Ht 59.0 in | Wt 167.6 lb

## 2017-07-05 DIAGNOSIS — I10 Essential (primary) hypertension: Secondary | ICD-10-CM

## 2017-07-05 DIAGNOSIS — I4510 Unspecified right bundle-branch block: Secondary | ICD-10-CM | POA: Diagnosis not present

## 2017-07-05 DIAGNOSIS — R001 Bradycardia, unspecified: Secondary | ICD-10-CM | POA: Diagnosis not present

## 2017-07-05 NOTE — Patient Instructions (Signed)
Stop taking metoprolol  Continue your other therapy  I will see you in one year.

## 2017-07-07 NOTE — Telephone Encounter (Signed)
close

## 2017-08-04 ENCOUNTER — Other Ambulatory Visit: Payer: Self-pay | Admitting: Cardiology

## 2017-10-06 DIAGNOSIS — Z23 Encounter for immunization: Secondary | ICD-10-CM | POA: Diagnosis not present

## 2017-10-06 DIAGNOSIS — F0390 Unspecified dementia without behavioral disturbance: Secondary | ICD-10-CM | POA: Diagnosis not present

## 2017-10-06 DIAGNOSIS — E668 Other obesity: Secondary | ICD-10-CM | POA: Diagnosis not present

## 2017-10-06 DIAGNOSIS — E038 Other specified hypothyroidism: Secondary | ICD-10-CM | POA: Diagnosis not present

## 2017-10-06 DIAGNOSIS — N3281 Overactive bladder: Secondary | ICD-10-CM | POA: Diagnosis not present

## 2017-10-06 DIAGNOSIS — Z6832 Body mass index (BMI) 32.0-32.9, adult: Secondary | ICD-10-CM | POA: Diagnosis not present

## 2017-10-06 DIAGNOSIS — L298 Other pruritus: Secondary | ICD-10-CM | POA: Diagnosis not present

## 2017-10-06 DIAGNOSIS — N183 Chronic kidney disease, stage 3 (moderate): Secondary | ICD-10-CM | POA: Diagnosis not present

## 2017-10-06 DIAGNOSIS — I10 Essential (primary) hypertension: Secondary | ICD-10-CM | POA: Diagnosis not present

## 2017-10-06 DIAGNOSIS — R413 Other amnesia: Secondary | ICD-10-CM | POA: Diagnosis not present

## 2017-10-06 DIAGNOSIS — R296 Repeated falls: Secondary | ICD-10-CM | POA: Diagnosis not present

## 2017-10-07 DIAGNOSIS — N390 Urinary tract infection, site not specified: Secondary | ICD-10-CM | POA: Diagnosis not present

## 2017-10-07 DIAGNOSIS — Z0000 Encounter for general adult medical examination without abnormal findings: Secondary | ICD-10-CM | POA: Diagnosis not present

## 2018-02-17 DIAGNOSIS — Z6832 Body mass index (BMI) 32.0-32.9, adult: Secondary | ICD-10-CM | POA: Diagnosis not present

## 2018-02-17 DIAGNOSIS — J40 Bronchitis, not specified as acute or chronic: Secondary | ICD-10-CM | POA: Diagnosis not present

## 2018-02-17 DIAGNOSIS — R05 Cough: Secondary | ICD-10-CM | POA: Diagnosis not present

## 2018-03-03 DIAGNOSIS — E038 Other specified hypothyroidism: Secondary | ICD-10-CM | POA: Diagnosis not present

## 2018-03-03 DIAGNOSIS — H6123 Impacted cerumen, bilateral: Secondary | ICD-10-CM | POA: Diagnosis not present

## 2018-03-03 DIAGNOSIS — L298 Other pruritus: Secondary | ICD-10-CM | POA: Diagnosis not present

## 2018-03-03 DIAGNOSIS — F0390 Unspecified dementia without behavioral disturbance: Secondary | ICD-10-CM | POA: Diagnosis not present

## 2018-03-03 DIAGNOSIS — I10 Essential (primary) hypertension: Secondary | ICD-10-CM | POA: Diagnosis not present

## 2018-03-03 DIAGNOSIS — R0609 Other forms of dyspnea: Secondary | ICD-10-CM | POA: Diagnosis not present

## 2018-03-03 DIAGNOSIS — Z6832 Body mass index (BMI) 32.0-32.9, adult: Secondary | ICD-10-CM | POA: Diagnosis not present

## 2018-03-03 DIAGNOSIS — J40 Bronchitis, not specified as acute or chronic: Secondary | ICD-10-CM | POA: Diagnosis not present

## 2018-03-14 DIAGNOSIS — Z8709 Personal history of other diseases of the respiratory system: Secondary | ICD-10-CM | POA: Diagnosis not present

## 2018-03-14 DIAGNOSIS — Z7289 Other problems related to lifestyle: Secondary | ICD-10-CM | POA: Diagnosis not present

## 2018-03-14 DIAGNOSIS — H6121 Impacted cerumen, right ear: Secondary | ICD-10-CM | POA: Diagnosis not present

## 2018-05-24 DIAGNOSIS — J40 Bronchitis, not specified as acute or chronic: Secondary | ICD-10-CM | POA: Diagnosis not present

## 2018-05-24 DIAGNOSIS — L210 Seborrhea capitis: Secondary | ICD-10-CM | POA: Diagnosis not present

## 2018-05-24 DIAGNOSIS — Z6832 Body mass index (BMI) 32.0-32.9, adult: Secondary | ICD-10-CM | POA: Diagnosis not present

## 2018-05-24 DIAGNOSIS — I10 Essential (primary) hypertension: Secondary | ICD-10-CM | POA: Diagnosis not present

## 2018-05-24 DIAGNOSIS — R05 Cough: Secondary | ICD-10-CM | POA: Diagnosis not present

## 2018-05-30 ENCOUNTER — Other Ambulatory Visit: Payer: Self-pay | Admitting: Cardiology

## 2018-06-02 DIAGNOSIS — D509 Iron deficiency anemia, unspecified: Secondary | ICD-10-CM | POA: Diagnosis not present

## 2018-06-02 DIAGNOSIS — K644 Residual hemorrhoidal skin tags: Secondary | ICD-10-CM | POA: Diagnosis not present

## 2018-06-02 DIAGNOSIS — Z6833 Body mass index (BMI) 33.0-33.9, adult: Secondary | ICD-10-CM | POA: Diagnosis not present

## 2018-06-02 DIAGNOSIS — K625 Hemorrhage of anus and rectum: Secondary | ICD-10-CM | POA: Diagnosis not present

## 2018-06-13 ENCOUNTER — Other Ambulatory Visit: Payer: Self-pay | Admitting: Cardiology

## 2018-06-13 MED ORDER — AMLODIPINE BESYLATE 5 MG PO TABS: 5.0000 mg | ORAL_TABLET | Freq: Every day | ORAL | 0 refills | Status: DC

## 2018-06-13 NOTE — Telephone Encounter (Signed)
New Message   *STAT* If patient is at the pharmacy, call can be transferred to refill team.   1. Which medications need to be refilled? (please list name of each medication and dose if known) amLODipine (NORVASC) 5 MG tablet  2. Which pharmacy/location (including street and city if local pharmacy) is medication to be sent to? [id number] [place], [place] - [address]. AT [address]   3. Do they need a 30 day or 90 day supply? [place]

## 2018-07-06 DIAGNOSIS — Z1389 Encounter for screening for other disorder: Secondary | ICD-10-CM | POA: Diagnosis not present

## 2018-07-06 DIAGNOSIS — N183 Chronic kidney disease, stage 3 (moderate): Secondary | ICD-10-CM | POA: Diagnosis not present

## 2018-07-06 DIAGNOSIS — F0390 Unspecified dementia without behavioral disturbance: Secondary | ICD-10-CM | POA: Diagnosis not present

## 2018-07-06 DIAGNOSIS — I10 Essential (primary) hypertension: Secondary | ICD-10-CM | POA: Diagnosis not present

## 2018-07-06 DIAGNOSIS — E038 Other specified hypothyroidism: Secondary | ICD-10-CM | POA: Diagnosis not present

## 2018-07-06 DIAGNOSIS — Z6833 Body mass index (BMI) 33.0-33.9, adult: Secondary | ICD-10-CM | POA: Diagnosis not present

## 2018-07-06 DIAGNOSIS — L299 Pruritus, unspecified: Secondary | ICD-10-CM | POA: Diagnosis not present

## 2018-07-06 DIAGNOSIS — K625 Hemorrhage of anus and rectum: Secondary | ICD-10-CM | POA: Diagnosis not present

## 2018-09-29 DIAGNOSIS — Z23 Encounter for immunization: Secondary | ICD-10-CM | POA: Diagnosis not present

## 2018-12-01 NOTE — Progress Notes (Signed)
[name] Date of Birth: 1931/06/20 Medical Record #[id number]  History of Present Illness: [name] is seen for follow up. She has a history of hypertension, hyperlipidemia, and right bundle branch block. She's had previous left elbow replacement with revision in 2011. She later had complete dislocation of her elbow prosthesis. On her last visit she was severely bradycardic and her metoprolol was discontinued.   On follow up she has been feeling well. She denies any chest pain, palpitations, SOB or fatigue. No dizziness. She remains active with several clubs and [place] the mountains several times a year.   Current Outpatient Medications on File Prior to Visit  Medication Sig Dispense Refill  . aspirin 81 MG tablet Take 81 mg by mouth daily.    . beta carotene w/minerals (OCUVITE) tablet Take 1 tablet by mouth daily.    Marland Kitchen docusate sodium 100 MG CAPS Take 100 mg by mouth 2 (two) times daily as needed for constipation. For constipation 60 capsule 3  . hydrochlorothiazide (HYDRODIURIL) 25 MG tablet Take 1 tablet (25 mg total) by mouth daily. 90 tablet 3  . irbesartan (AVAPRO) 150 MG tablet Take 1 tablet (150 mg total) by mouth at bedtime.    Marland Kitchen levothyroxine (SYNTHROID, LEVOTHROID) 150 MCG tablet Take 150 mcg by mouth daily.  11  . pantoprazole (PROTONIX) 40 MG tablet Take 40 mg by mouth daily.    . rosuvastatin (CRESTOR) 20 MG tablet Take 20 mg by mouth daily.     No current facility-administered medications on file prior to visit.     No Known Allergies  Past Medical History:  Diagnosis Date  . Hiatal hernia   . Hyperlipidemia   . Hypertension   . Hypothyroidism   . Macular degeneration   . Obesity   . Osteoporosis   . Ovarian cancer ([place])    Stage IA grade 2 ovarian cancer    Past Surgical History:  Procedure Laterality Date  . CHOLECYSTECTOMY, LAPAROSCOPIC    . colectomy      Rectosigmoid colectomy with reanastomosis  . ELBOW ARTHROPLASTY     replacing the  humeral stem  . elbow replacement    . KYPHOPLASTY N/A 03/02/2013   Procedure: KYPHOPLASTY;  Surgeon: Erline Levine, MD;  Location: [facility];  Service: Neurosurgery;  Laterality: N/A;  Lumbar One Kyphoplasty  . OTHER SURGICAL HISTORY    . OTHER SURGICAL HISTORY     Ovarian cancer resection and staging    Social History   Tobacco Use  Smoking Status Former Smoker  Smokeless Tobacco Never Used    Social History   Substance and Sexual Activity  Alcohol Use Yes  . Alcohol/week: 0.0 standard drinks   Comment: occasionally    Family History  Problem Relation Age of Onset  . Heart attack Father     Review of Systems: As noted in history of present illness.  All other systems were reviewed and are negative.  Physical Exam: BP (!) 98/58   Pulse 61   Ht 5\' 4"  (1.626 m)   Wt 170 lb 9.6 oz (77.4 kg)   BMI 29.28 kg/m  GENERAL:  Well appearing WF in NAD. Walks with a cane. HEENT:  PERRL, EOMI, sclera are clear. Oropharynx is clear. NECK:  No jugular venous distention, carotid upstroke brisk and symmetric, no bruits, no thyromegaly or adenopathy LUNGS:  Clear to auscultation bilaterally CHEST:  Unremarkable HEART:  RRR,  PMI not displaced or sustained,S1 and S2 within normal limits, no S3,  no S4: no clicks, no rubs, no murmurs ABD:  Soft, nontender. BS +, no masses or bruits. No hepatomegaly, no splenomegaly EXT:  2 + pulses throughout, no edema, no cyanosis no clubbing. Left elbow prosthesis is dislocated. SKIN:  Warm and dry.  No rashes NEURO:  Alert and oriented x 3. Cranial nerves II through XII intact. PSYCH:  Cognitively intact    LABORATORY DATA: Dated 01/22/17: cholesterol 205, triglycerides 84, HDL 54, LDL 134.  Dated 06/02/18: Hgb 10.3. creatinine 0.9. TSH normal  Ecg today shows NSR rate 61. RBBB. No acute change. I have personally reviewed and interpreted this study.   Assessment / Plan: 1. Hypertension. Blood pressure is low even with stopping metoprolol.  Bradycardia has improved. Recommend she stop taking amlodipine now. Continue Avapro and HCTZ. If BP remains low can stop HCTZ.  Continue her other therapy. Follow up in one year.   2. Chronic right bundle branch block.  3. Dislocation of prior elbow or placement.   4. Hyperlipidemia. On statin therapy. Labs followed by Dr. Dagmar Hait.

## 2018-12-02 ENCOUNTER — Encounter: Payer: Self-pay | Admitting: Cardiology

## 2018-12-02 ENCOUNTER — Ambulatory Visit (INDEPENDENT_AMBULATORY_CARE_PROVIDER_SITE_OTHER): Payer: Medicare Other | Admitting: Cardiology

## 2018-12-02 ENCOUNTER — Encounter (INDEPENDENT_AMBULATORY_CARE_PROVIDER_SITE_OTHER): Payer: Self-pay

## 2018-12-02 VITALS — BP 98/58 | HR 61 | Ht 64.0 in | Wt 170.6 lb

## 2018-12-02 DIAGNOSIS — R001 Bradycardia, unspecified: Secondary | ICD-10-CM

## 2018-12-02 DIAGNOSIS — I4510 Unspecified right bundle-branch block: Secondary | ICD-10-CM | POA: Diagnosis not present

## 2018-12-02 DIAGNOSIS — I10 Essential (primary) hypertension: Secondary | ICD-10-CM

## 2018-12-02 NOTE — Patient Instructions (Signed)
Stop taking amlodipine  Continue your other therapy  Follow up in one year.

## 2018-12-06 DIAGNOSIS — W19XXXA Unspecified fall, initial encounter: Secondary | ICD-10-CM | POA: Diagnosis not present

## 2018-12-06 DIAGNOSIS — L299 Pruritus, unspecified: Secondary | ICD-10-CM | POA: Diagnosis not present

## 2018-12-06 DIAGNOSIS — N183 Chronic kidney disease, stage 3 (moderate): Secondary | ICD-10-CM | POA: Diagnosis not present

## 2018-12-06 DIAGNOSIS — F0390 Unspecified dementia without behavioral disturbance: Secondary | ICD-10-CM | POA: Diagnosis not present

## 2018-12-06 DIAGNOSIS — E038 Other specified hypothyroidism: Secondary | ICD-10-CM | POA: Diagnosis not present

## 2018-12-06 DIAGNOSIS — I10 Essential (primary) hypertension: Secondary | ICD-10-CM | POA: Diagnosis not present

## 2018-12-06 DIAGNOSIS — K625 Hemorrhage of anus and rectum: Secondary | ICD-10-CM | POA: Diagnosis not present

## 2018-12-06 DIAGNOSIS — Z6834 Body mass index (BMI) 34.0-34.9, adult: Secondary | ICD-10-CM | POA: Diagnosis not present

## 2018-12-06 DIAGNOSIS — E668 Other obesity: Secondary | ICD-10-CM | POA: Diagnosis not present

## 2018-12-07 DIAGNOSIS — D509 Iron deficiency anemia, unspecified: Secondary | ICD-10-CM | POA: Diagnosis not present

## 2018-12-07 DIAGNOSIS — R7989 Other specified abnormal findings of blood chemistry: Secondary | ICD-10-CM | POA: Diagnosis not present

## 2018-12-07 DIAGNOSIS — N183 Chronic kidney disease, stage 3 (moderate): Secondary | ICD-10-CM | POA: Diagnosis not present

## 2019-01-02 ENCOUNTER — Other Ambulatory Visit: Payer: Self-pay | Admitting: Cardiology

## 2019-01-04 ENCOUNTER — Encounter: Payer: Self-pay | Admitting: Gastroenterology

## 2019-01-12 DIAGNOSIS — N183 Chronic kidney disease, stage 3 (moderate): Secondary | ICD-10-CM | POA: Diagnosis not present

## 2019-01-13 DIAGNOSIS — K625 Hemorrhage of anus and rectum: Secondary | ICD-10-CM | POA: Diagnosis not present

## 2019-01-17 ENCOUNTER — Other Ambulatory Visit (INDEPENDENT_AMBULATORY_CARE_PROVIDER_SITE_OTHER): Payer: Medicare Other

## 2019-01-17 ENCOUNTER — Encounter (INDEPENDENT_AMBULATORY_CARE_PROVIDER_SITE_OTHER): Payer: Self-pay

## 2019-01-17 ENCOUNTER — Ambulatory Visit (INDEPENDENT_AMBULATORY_CARE_PROVIDER_SITE_OTHER): Payer: Medicare Other | Admitting: Gastroenterology

## 2019-01-17 ENCOUNTER — Encounter: Payer: Self-pay | Admitting: Gastroenterology

## 2019-01-17 VITALS — BP 130/70 | HR 68 | Ht 59.0 in | Wt 171.5 lb

## 2019-01-17 DIAGNOSIS — D509 Iron deficiency anemia, unspecified: Secondary | ICD-10-CM | POA: Diagnosis not present

## 2019-01-17 DIAGNOSIS — K625 Hemorrhage of anus and rectum: Secondary | ICD-10-CM

## 2019-01-17 LAB — CBC WITH DIFFERENTIAL/PLATELET
BASOS ABS: 0.1 10*3/uL (ref 0.0–0.1)
Basophils Relative: 0.7 % (ref 0.0–3.0)
EOS ABS: 0.3 10*3/uL (ref 0.0–0.7)
Eosinophils Relative: 3.5 % (ref 0.0–5.0)
HCT: 37.3 % (ref 36.0–46.0)
Hemoglobin: 11.8 g/dL — ABNORMAL LOW (ref 12.0–15.0)
LYMPHS ABS: 1.6 10*3/uL (ref 0.7–4.0)
LYMPHS PCT: 19.8 % (ref 12.0–46.0)
MCHC: 31.7 g/dL (ref 30.0–36.0)
MCV: 79.1 fl (ref 78.0–100.0)
Monocytes Absolute: 0.8 10*3/uL (ref 0.1–1.0)
Monocytes Relative: 10.1 % (ref 3.0–12.0)
NEUTROS ABS: 5.3 10*3/uL (ref 1.4–7.7)
Neutrophils Relative %: 65.9 % (ref 43.0–77.0)
Platelets: 275 10*3/uL (ref 150.0–400.0)
RBC: 4.72 Mil/uL (ref 3.87–5.11)
RDW: 26.5 % — AB (ref 11.5–15.5)
WBC: 8 10*3/uL (ref 4.0–10.5)

## 2019-01-17 NOTE — Progress Notes (Signed)
HPI :  85 year old female with a reported history of ovarian cancer s/p surgery, dementia, hypertension, hyperlipidemia, referred here by Dr. Prince Solian, MD for iron deficiency anemia.  Labs done by Dr. Dagmar Hait in December as outlined:  Iron 23, TIBC 398, iron sat 6%, ferritin 4.2 Hgb 9.9, MCV 76.8, WBC 7.67, plt 262 BUN 20, Cr 0.9 LFTs normal Thyroid studies normal  The patient is accompanied by her son today. She denies any routine blood in her stools. She did have one episode of what is described to be a fair volume of red blood in the toilet within the past 4-6 weeks. She states that has not since recurred. She denies any rectal pain or abdominal pains. She has an average one to 2 bowel movements per day and is fairly regular. She denies any constipation or straining. She is eating well. No nausea or vomiting. No reflux symptoms. No dysphagia. No weight loss.  Prior records report history of colonoscopy with Dr. Cristina Gong in 2008, showing a "surgical anastomosis" but no polyps noted. The patient denies any knowledge of a prior bowel surgery. She has been using pantoprazole to control reflux symptoms. She is unaware of how long she's been on that regimen. She denies any NSAID use. She does take a baby aspirin. She has been taking oral iron since she is been noted to be iron deficient and tolerates it well. She denies any family history of colon cancer or gastric cancer. She denies any cardiopulmonary symptoms or chest pain. She is not on any other anticoagulation. She's never had a prior upper endoscopy.  She uses a walker for ambulation.  Colonoscopy - 08/23/2007 - Dr. Carma Leaven - patent anastomosis, no polyps seen   Past Medical History:  Diagnosis Date  . Hiatal hernia   . Hyperlipidemia   . Hypertension   . Hypothyroidism   . Macular degeneration   . Obesity   . Osteoporosis   . Ovarian cancer ([place])    Stage IA grade 2 ovarian cancer     Past Surgical History:  Procedure  Laterality Date  . CHOLECYSTECTOMY, LAPAROSCOPIC    . colectomy  2005    Rectosigmoid colectomy with reanastomosis  . ELBOW ARTHROPLASTY     replacing the humeral stem  . KYPHOPLASTY N/A 03/02/2013   Procedure: KYPHOPLASTY;  Surgeon: Erline Levine, MD;  Location: [facility];  Service: Neurosurgery;  Laterality: N/A;  Lumbar One Kyphoplasty  . OTHER SURGICAL HISTORY    . OTHER SURGICAL HISTORY     Ovarian cancer resection and staging   Family History  Problem Relation Age of Onset  . Heart attack Father    Social History   Tobacco Use  . Smoking status: Former Smoker    Types: Cigarettes    Last attempt to quit: 1993    Years since quitting: 27.0  . Smokeless tobacco: Never Used  Substance Use Topics  . Alcohol use: Not Currently    Alcohol/week: 0.0 standard drinks  . Drug use: No   Current Outpatient Medications  Medication Sig Dispense Refill  . aspirin 81 MG tablet Take 81 mg by mouth daily.    . beta carotene w/minerals (OCUVITE) tablet Take 1 tablet by mouth daily.    . cholecalciferol (VITAMIN D3) 25 MCG (1000 UT) tablet Take 1,000 Units by mouth daily.    . diphenhydrAMINE (BENADRYL) 25 MG tablet Take 25 mg by mouth daily.    Marland Kitchen docusate sodium 100 MG CAPS Take 100 mg by mouth 2 (  two) times daily as needed for constipation. For constipation 60 capsule 3  . donepezil (ARICEPT) 10 MG tablet Take 1 tablet by mouth daily.    . ferrous sulfate 325 (65 FE) MG EC tablet Take 325 mg by mouth daily.    . hydrochlorothiazide (HYDRODIURIL) 25 MG tablet Take 1 tablet (25 mg total) by mouth daily. 90 tablet 3  . irbesartan (AVAPRO) 150 MG tablet Take 1 tablet (150 mg total) by mouth at bedtime.    Marland Kitchen loratadine (CLARITIN) 10 MG tablet Take 10 mg by mouth daily.    . Multiple Vitamins-Minerals (WOMENS 50+ ADVANCED PO) Take 1 tablet by mouth daily.    Heide Spark Shell 500 MG TABS Take 1 tablet by mouth daily.    . pantoprazole (PROTONIX) 40 MG tablet Take 40 mg by mouth daily.    .  rosuvastatin (CRESTOR) 20 MG tablet Take 20 mg by mouth daily.    Marland Kitchen SYNTHROID 137 MCG tablet Take 137 mcg by mouth daily.    . vitamin C (ASCORBIC ACID) 500 MG tablet Take 500 mg by mouth daily.     No current facility-administered medications for this visit.    No Known Allergies   Review of Systems: All systems reviewed and negative except where noted in HPI.    No results found.  Physical Exam: BP 130/70 (BP Location: Right Arm, Patient Position: Sitting, Cuff Size: Normal)   Pulse 68   Ht 4\' 11"  (1.499 m) Comment: height measured without shoes  Wt 171 lb 8 oz (77.8 kg)   BMI 34.64 kg/m  Constitutional: Pleasant, female in no acute distress - son answers several question for her HEENT: Normocephalic and atraumatic. Conjunctivae are normal. No scleral icterus. Neck supple.  Cardiovascular: Normal rate, regular rhythm.  Pulmonary/chest: Effort normal and breath sounds normal. No wheezing, rales or rhonchi. Abdominal: Soft, nondistended, nontender. . There are no masses palpable. No hepatomegaly. DRE - standby Tia Alert CMA - no mass lesions appreciated Extremities: no edema, left upper extremity fracture / deformity Lymphadenopathy: No cervical adenopathy noted. Neurological: Alert and oriented to person place Skin: Skin is warm and dry. No rashes noted. Psychiatric: Normal mood and affect. Behavior is normal.   ASSESSMENT AND PLAN: 85 year old female here for assessment of the following issues:  Iron deficiency anemia / rectal bleeding - noted to have an iron deficiency anemia in the setting of one episode of fair volume rectal bleeding. She has not had any recurrence of her symptoms. Her DRE shows no obvious rectal mass. I discussed options with the patient and her son. Normally in this situation an EGD and colonoscopy would be recommended to further evaluate to exclude malignancy and clarify etiology. I discussed what this would entail with her including risks and benefits  of the procedure and anesthesia. Given her age, the patient states she does not want a colonoscopy, while the son however is in favor of endoscopic evaluation however is concerned that the patient will continue to refuse it. I discussed with the patient that one option is observation, however the risk of that is not detecting an underlying malignancy. She verbalized understanding of the risks of proceeding with evaluation versus risks of not evaluating the anemia. She wants to think about this and talk more with her son at home as well as other family members prior to making a decision, although her preference is to declined endoscopy at this time. I will repeat her CBC today to ensure the hemoglobin is improving on  oral iron. If her anemia is significantly worsened, sone feels she may be more willing to have an evaluation.   [place] Cellar, MD [facility]  CC: Prince Solian, MD

## 2019-01-17 NOTE — Patient Instructions (Signed)
If you are age 83 or older, your body mass index should be between 23-30. Your Body mass index is 34.64 kg/m. If this is out of the aforementioned range listed, please consider follow up with your Primary Care Provider.  If you are age 42 or younger, your body mass index should be between 19-25. Your Body mass index is 34.64 kg/m. If this is out of the aformentioned range listed, please consider follow up with your Primary Care Provider.   Please go to the lab in the basement of our building to have lab work done as you leave today. Hit "B" for basement when you get on the elevator.  When the doors open the lab is on your left.  We will call you with the results. Thank you.  Thank you for entrusting me with your care and for choosing [facility], Dr. [place] Cellar

## 2019-01-19 ENCOUNTER — Other Ambulatory Visit: Payer: Self-pay

## 2019-01-19 DIAGNOSIS — K625 Hemorrhage of anus and rectum: Secondary | ICD-10-CM

## 2019-01-19 DIAGNOSIS — D509 Iron deficiency anemia, unspecified: Secondary | ICD-10-CM

## 2019-04-06 ENCOUNTER — Telehealth: Payer: Self-pay

## 2019-04-06 NOTE — Telephone Encounter (Signed)
Called and spoke to pt's husband.  He took the message that pt needs to go to the lab and will take her there this week. CBC order is in.

## 2019-04-06 NOTE — Telephone Encounter (Signed)
-----   Message from Sara Flock, MD sent at 04/06/2019  8:40 AM EDT ----- [name] I think it would be best if this patient could come to the lab at her convenience to get the CBC done. Thanks ----- Message ----- From: Roetta Sessions, CMA Sent: 04/05/2019  11:11 AM EDT To: Sara Flock, MD  OK to postpone until May?  [name]    ----- Message ----- From: Mohammed Kindle, RN Sent: 04/05/2019 To: Roetta Sessions, CMA  Hello, This patient will need a repeat CBC at the end of April to recheck Hgb per Dr. Havery Moros.  CBC on 01/17/2019 showed Hgb 11.8;  order already placed in Epic; I will need you to make sure the patient is aware to have labs drawn.   Thank you Bre

## 2019-04-12 ENCOUNTER — Other Ambulatory Visit (INDEPENDENT_AMBULATORY_CARE_PROVIDER_SITE_OTHER): Payer: Medicare Other

## 2019-04-12 DIAGNOSIS — D509 Iron deficiency anemia, unspecified: Secondary | ICD-10-CM | POA: Diagnosis not present

## 2019-04-12 DIAGNOSIS — K625 Hemorrhage of anus and rectum: Secondary | ICD-10-CM | POA: Diagnosis not present

## 2019-04-12 LAB — CBC WITH DIFFERENTIAL/PLATELET
Basophils Absolute: 0 10*3/uL (ref 0.0–0.1)
Basophils Relative: 0.4 % (ref 0.0–3.0)
Eosinophils Absolute: 0.3 10*3/uL (ref 0.0–0.7)
Eosinophils Relative: 3 % (ref 0.0–5.0)
HCT: 42 % (ref 36.0–46.0)
Hemoglobin: 14.2 g/dL (ref 12.0–15.0)
Lymphocytes Relative: 18.4 % (ref 12.0–46.0)
Lymphs Abs: 1.6 10*3/uL (ref 0.7–4.0)
MCHC: 33.8 g/dL (ref 30.0–36.0)
MCV: 86.9 fl (ref 78.0–100.0)
Monocytes Absolute: 0.9 10*3/uL (ref 0.1–1.0)
Monocytes Relative: 10.2 % (ref 3.0–12.0)
Neutro Abs: 6.1 10*3/uL (ref 1.4–7.7)
Neutrophils Relative %: 68 % (ref 43.0–77.0)
Platelets: 251 10*3/uL (ref 150.0–400.0)
RBC: 4.84 Mil/uL (ref 3.87–5.11)
RDW: 15.8 % — ABNORMAL HIGH (ref 11.5–15.5)
WBC: 8.9 10*3/uL (ref 4.0–10.5)

## 2019-05-17 NOTE — Progress Notes (Signed)
Prescreened pt with her son, Newton Pigg, for tomorrow's appt. Confirmed Doximity

## 2019-05-18 ENCOUNTER — Other Ambulatory Visit: Payer: Self-pay

## 2019-05-18 ENCOUNTER — Ambulatory Visit (INDEPENDENT_AMBULATORY_CARE_PROVIDER_SITE_OTHER): Payer: Medicare Other | Admitting: Gastroenterology

## 2019-05-18 ENCOUNTER — Encounter: Payer: Self-pay | Admitting: Gastroenterology

## 2019-05-18 VITALS — Ht 59.0 in

## 2019-05-18 DIAGNOSIS — D509 Iron deficiency anemia, unspecified: Secondary | ICD-10-CM

## 2019-05-18 NOTE — Progress Notes (Signed)
Virtual Visit via Telephone Note  I connected with [name] on 05/18/19 at  2:00 PM EDT by telephone and verified that I am speaking with the correct person using two identifiers.  I discussed the limitations, risks, security and privacy concerns of performing an evaluation and management service by telephone and the availability of in person appointments. I also discussed with the patient that there may be a patient responsible charge related to this service. The patient expressed understanding and agreed to proceed.  THIS ENCOUNTER IS A VIRTUAL VISIT DUE TO COVID-19 - PATIENT WAS NOT SEEN IN THE OFFICE. PATIENT HAS CONSENTED TO VIRTUAL VISIT / TELEMEDICINE VISIT   Location of patient: home Location of provider: office Persons participating: myself, patient, patient's son  HPI : 85 y/o female here for a follow up visit for iron deficiency anemia.  Labs done by Dr. Dagmar Hait in December as outlined: iron 23, TIBC 398, iron sat 6%, ferritin 4.2, Hgb 9.9, MCV 76.8  She was referred to me in January. At the time she had no bowel symptoms at present but had endorsed some remote rectal bleeding. Her last colonoscopy was in 2008 per Dr. Carma Leaven did not find any polyps. We had discussed ddx and options. At the time the patient and son declined EGD and colonoscopy and treated with iron supplementation. Hgb increased from 9.9 to 11.8 as of [name] 28th. Hgb then normalized in April 22nd - Hgb to 14.2.  She denies any blood in the stools. Bowels are regular. Taking iron once daily. She tolerates it well, not having any upset stomach or constipation. NO NSAIDs use. She has been on protonix 40mg  once daily for reflux which works well. No abdominal pains.   Colonoscopy - 08/23/2007 - Dr. Carma Leaven - patent anastomosis, no polyps seen  No prior upper endoscopy.    Past Medical History:  Diagnosis Date  . Hiatal hernia   . Hyperlipidemia   . Hypertension   . Hypothyroidism   . Macular degeneration   .  Obesity   . Osteoporosis   . Ovarian cancer ([place])    Stage IA grade 2 ovarian cancer     Past Surgical History:  Procedure Laterality Date  . CHOLECYSTECTOMY, LAPAROSCOPIC    . colectomy  2005    Rectosigmoid colectomy with reanastomosis  . ELBOW ARTHROPLASTY     replacing the humeral stem  . KYPHOPLASTY N/A 03/02/2013   Procedure: KYPHOPLASTY;  Surgeon: Erline Levine, MD;  Location: [place] [facility];  Service: Neurosurgery;  Laterality: N/A;  Lumbar One Kyphoplasty  . OTHER SURGICAL HISTORY    . OTHER SURGICAL HISTORY     Ovarian cancer resection and staging   Family History  Problem Relation Age of Onset  . Heart attack Father    Social History   Tobacco Use  . Smoking status: Former Smoker    Types: Cigarettes    Last attempt to quit: 1993    Years since quitting: 27.4  . Smokeless tobacco: Never Used  Substance Use Topics  . Alcohol use: Not Currently    Alcohol/week: 0.0 standard drinks  . Drug use: No   Current Outpatient Medications  Medication Sig Dispense Refill  . aspirin 81 MG tablet Take 81 mg by mouth daily.    . beta carotene w/minerals (OCUVITE) tablet Take 1 tablet by mouth daily.    . cholecalciferol (VITAMIN D3) 25 MCG (1000 UT) tablet Take 1,000 Units by mouth daily.    . diphenhydrAMINE (BENADRYL) 25 MG  tablet Take 25 mg by mouth daily.    Marland Kitchen docusate sodium 100 MG CAPS Take 100 mg by mouth 2 (two) times daily as needed for constipation. For constipation 60 capsule 3  . donepezil (ARICEPT) 10 MG tablet Take 1 tablet by mouth daily.    . ferrous sulfate 325 (65 FE) MG EC tablet Take 325 mg by mouth daily.    . hydrochlorothiazide (HYDRODIURIL) 25 MG tablet Take 1 tablet (25 mg total) by mouth daily. 90 tablet 3  . irbesartan (AVAPRO) 150 MG tablet Take 1 tablet (150 mg total) by mouth at bedtime. (Patient taking differently: Take 150 mg by mouth daily. )    . loratadine (CLARITIN) 10 MG tablet Take 10 mg by mouth daily.    . Multiple Vitamins-Minerals  (WOMENS 50+ ADVANCED PO) Take 1 tablet by mouth daily.    Heide Spark Shell 500 MG TABS Take 1 tablet by mouth daily.    . pantoprazole (PROTONIX) 40 MG tablet Take 40 mg by mouth daily.    . rosuvastatin (CRESTOR) 20 MG tablet Take 20 mg by mouth daily.    Marland Kitchen SYNTHROID 137 MCG tablet Take 137 mcg by mouth daily.    . vitamin C (ASCORBIC ACID) 500 MG tablet Take 500 mg by mouth daily.     No current facility-administered medications for this visit.    No Known Allergies   Review of Systems: All systems reviewed and negative except where noted in HPI.   Lab Results  Component Value Date   WBC 8.9 04/12/2019   HGB 14.2 04/12/2019   HCT 42.0 04/12/2019   MCV 86.9 04/12/2019   PLT 251.0 04/12/2019    CBC Latest Ref Rng & Units 04/12/2019 01/17/2019 09/18/2015  WBC 4.0 - 10.5 K/uL 8.9 8.0 10.6(H)  Hemoglobin 12.0 - 15.0 g/dL 14.2 11.8(L) 13.1  Hematocrit 36.0 - 46.0 % 42.0 37.3 40.6  Platelets 150.0 - 400.0 K/uL 251.0 275.0 223    No results found for: IRON, TIBC, FERRITIN   Physical Exam: Ht 4\' 11"  (1.499 m)   BMI 34.64 kg/m  NA  ASSESSMENT AND PLAN: 85 y/o female here for reassessment of the following issues:  Iron deficiency anemia - one remote episode of fair volume of rectal bleeding several months ago now, no recurrence. Prior DRE showed no mass lesions. In January I saw patient and her son and discussed options regarding her iron deficiency - we discussed EGD and colonoscopy versus observation given her age and medical problems. They declined endoscopic evaluation at the time and preferred monitoring on iron therapy. Her Hgb has normalized nicely and she feels well without any complaints. We discussed options again - she understands what EGD and colonoscopy would entail, and that by declining endoscopic evaluation there is a risk of not detecting an underlying malignancy. She and son feel strongly that they do NOT want further evaluation for this issue unless she becomes  symptomatic or anemia recurs or worsens. Will plan on repeat CBC in 6 months or so, continue iron for now. If she develops any symptoms in the interim they will contact me. All questions answered.  [place] Cellar, MD [facility]

## 2019-06-06 DIAGNOSIS — Z0000 Encounter for general adult medical examination without abnormal findings: Secondary | ICD-10-CM | POA: Diagnosis not present

## 2019-06-06 DIAGNOSIS — N183 Chronic kidney disease, stage 3 (moderate): Secondary | ICD-10-CM | POA: Diagnosis not present

## 2019-06-06 DIAGNOSIS — E039 Hypothyroidism, unspecified: Secondary | ICD-10-CM | POA: Diagnosis not present

## 2019-06-06 DIAGNOSIS — L299 Pruritus, unspecified: Secondary | ICD-10-CM | POA: Diagnosis not present

## 2019-06-06 DIAGNOSIS — M810 Age-related osteoporosis without current pathological fracture: Secondary | ICD-10-CM | POA: Diagnosis not present

## 2019-06-06 DIAGNOSIS — E669 Obesity, unspecified: Secondary | ICD-10-CM | POA: Diagnosis not present

## 2019-06-06 DIAGNOSIS — D509 Iron deficiency anemia, unspecified: Secondary | ICD-10-CM | POA: Diagnosis not present

## 2019-06-06 DIAGNOSIS — I129 Hypertensive chronic kidney disease with stage 1 through stage 4 chronic kidney disease, or unspecified chronic kidney disease: Secondary | ICD-10-CM | POA: Diagnosis not present

## 2019-06-06 DIAGNOSIS — K625 Hemorrhage of anus and rectum: Secondary | ICD-10-CM | POA: Diagnosis not present

## 2019-06-06 DIAGNOSIS — E785 Hyperlipidemia, unspecified: Secondary | ICD-10-CM | POA: Diagnosis not present

## 2019-06-09 DIAGNOSIS — D509 Iron deficiency anemia, unspecified: Secondary | ICD-10-CM | POA: Diagnosis not present

## 2019-06-09 DIAGNOSIS — I10 Essential (primary) hypertension: Secondary | ICD-10-CM | POA: Diagnosis not present

## 2019-06-09 DIAGNOSIS — M810 Age-related osteoporosis without current pathological fracture: Secondary | ICD-10-CM | POA: Diagnosis not present

## 2019-07-27 DIAGNOSIS — M25522 Pain in left elbow: Secondary | ICD-10-CM | POA: Diagnosis not present

## 2019-08-18 DIAGNOSIS — Z23 Encounter for immunization: Secondary | ICD-10-CM | POA: Diagnosis not present

## 2019-08-18 DIAGNOSIS — E038 Other specified hypothyroidism: Secondary | ICD-10-CM | POA: Diagnosis not present

## 2019-11-13 ENCOUNTER — Other Ambulatory Visit (INDEPENDENT_AMBULATORY_CARE_PROVIDER_SITE_OTHER): Payer: Medicare Other

## 2019-11-13 DIAGNOSIS — D509 Iron deficiency anemia, unspecified: Secondary | ICD-10-CM | POA: Diagnosis not present

## 2019-11-13 LAB — CBC WITH DIFFERENTIAL/PLATELET
Basophils Absolute: 0.1 10*3/uL (ref 0.0–0.1)
Basophils Relative: 0.8 % (ref 0.0–3.0)
Eosinophils Absolute: 0.3 10*3/uL (ref 0.0–0.7)
Eosinophils Relative: 3.7 % (ref 0.0–5.0)
HCT: 40.9 % (ref 36.0–46.0)
Hemoglobin: 13.5 g/dL (ref 12.0–15.0)
Lymphocytes Relative: 23.3 % (ref 12.0–46.0)
Lymphs Abs: 1.7 10*3/uL (ref 0.7–4.0)
MCHC: 33 g/dL (ref 30.0–36.0)
MCV: 93 fl (ref 78.0–100.0)
Monocytes Absolute: 0.7 10*3/uL (ref 0.1–1.0)
Monocytes Relative: 9.5 % (ref 3.0–12.0)
Neutro Abs: 4.7 10*3/uL (ref 1.4–7.7)
Neutrophils Relative %: 62.7 % (ref 43.0–77.0)
Platelets: 249 10*3/uL (ref 150.0–400.0)
RBC: 4.4 Mil/uL (ref 3.87–5.11)
RDW: 14.1 % (ref 11.5–15.5)
WBC: 7.5 10*3/uL (ref 4.0–10.5)

## 2019-11-15 ENCOUNTER — Telehealth: Payer: Self-pay

## 2019-11-15 NOTE — Telephone Encounter (Signed)
Called and someone answered the phone and asked me to call back later when patient's son is home. Will try again Monday ( because of the [place])

## 2019-11-22 DIAGNOSIS — E039 Hypothyroidism, unspecified: Secondary | ICD-10-CM | POA: Diagnosis not present

## 2019-11-22 DIAGNOSIS — D509 Iron deficiency anemia, unspecified: Secondary | ICD-10-CM | POA: Diagnosis not present

## 2019-11-22 DIAGNOSIS — N183 Chronic kidney disease, stage 3 unspecified: Secondary | ICD-10-CM | POA: Diagnosis not present

## 2019-11-22 DIAGNOSIS — E669 Obesity, unspecified: Secondary | ICD-10-CM | POA: Diagnosis not present

## 2019-11-22 DIAGNOSIS — I129 Hypertensive chronic kidney disease with stage 1 through stage 4 chronic kidney disease, or unspecified chronic kidney disease: Secondary | ICD-10-CM | POA: Diagnosis not present

## 2019-11-22 DIAGNOSIS — L299 Pruritus, unspecified: Secondary | ICD-10-CM | POA: Diagnosis not present

## 2019-11-22 DIAGNOSIS — F0390 Unspecified dementia without behavioral disturbance: Secondary | ICD-10-CM | POA: Diagnosis not present

## 2019-12-05 NOTE — Progress Notes (Signed)
[name] Date of Birth: [birth year] Medical Record [id number]  History of Present Illness: [name] is seen for follow up. She has a history of hypertension, hyperlipidemia, and right bundle branch block. She's had previous left elbow replacement with revision in 2011. She later had complete dislocation of her elbow prosthesis. She has a history of severe bradycardia on metoprolol.    On follow up she has been feeling well. She denies any chest pain, palpitations, SOB or fatigue. No dizziness. She remains active with several clubs and goes to the mountains several times a year.   Current Outpatient Medications on File Prior to Visit  Medication Sig Dispense Refill  . aspirin 81 MG tablet Take 81 mg by mouth daily.    . beta carotene w/minerals (OCUVITE) tablet Take 1 tablet by mouth daily.    . cholecalciferol (VITAMIN D3) 25 MCG (1000 UT) tablet Take 1,000 Units by mouth daily.    . diphenhydrAMINE (BENADRYL) 25 MG tablet Take 25 mg by mouth daily.    Marland Kitchen docusate sodium 100 MG CAPS Take 100 mg by mouth 2 (two) times daily as needed for constipation. For constipation 60 capsule 3  . donepezil (ARICEPT) 10 MG tablet Take 1 tablet by mouth daily.    . ferrous sulfate 325 (65 FE) MG EC tablet Take 325 mg by mouth daily.    . hydrochlorothiazide (HYDRODIURIL) 25 MG tablet Take 1 tablet (25 mg total) by mouth daily. 90 tablet 3  . irbesartan (AVAPRO) 150 MG tablet Take 1 tablet (150 mg total) by mouth at bedtime. (Patient taking differently: Take 150 mg by mouth daily. )    . loratadine (CLARITIN) 10 MG tablet Take 10 mg by mouth daily.    . Multiple Vitamins-Minerals (WOMENS 50+ ADVANCED PO) Take 1 tablet by mouth daily.    Heide Spark Shell 500 MG TABS Take 1 tablet by mouth daily.    . pantoprazole (PROTONIX) 40 MG tablet Take 40 mg by mouth daily.    . rosuvastatin (CRESTOR) 20 MG tablet Take 20 mg by mouth daily.    Marland Kitchen SYNTHROID 137 MCG tablet Take 137 mcg by mouth daily.    . vitamin  C (ASCORBIC ACID) 500 MG tablet Take 500 mg by mouth daily.     No current facility-administered medications on file prior to visit.    No Known Allergies  Past Medical History:  Diagnosis Date  . Hiatal hernia   . Hyperlipidemia   . Hypertension   . Hypothyroidism   . Macular degeneration   . Obesity   . Osteoporosis   . Ovarian cancer ([place])    Stage IA grade 2 ovarian cancer    Past Surgical History:  Procedure Laterality Date  . CHOLECYSTECTOMY, LAPAROSCOPIC    . colectomy  2005    Rectosigmoid colectomy with reanastomosis  . ELBOW ARTHROPLASTY     replacing the humeral stem  . KYPHOPLASTY N/A 03/02/2013   Procedure: KYPHOPLASTY;  Surgeon: Erline Levine, MD;  Location: [place] [facility];  Service: Neurosurgery;  Laterality: N/A;  Lumbar One Kyphoplasty  . OTHER SURGICAL HISTORY    . OTHER SURGICAL HISTORY     Ovarian cancer resection and staging    Social History   Tobacco Use  Smoking Status Former Smoker  . Types: Cigarettes  . Quit date: 30  . Years since quitting: 27.9  Smokeless Tobacco Never Used    Social History   Substance and Sexual Activity  Alcohol Use Not Currently  .  Alcohol/week: 0.0 standard drinks    Family History  Problem Relation Age of Onset  . Heart attack Father     Review of Systems: As noted in history of present illness.  All other systems were reviewed and are negative.  Physical Exam: There were no vitals taken for this visit. GENERAL:  Well appearing WF in NAD. Walks with a cane. HEENT:  PERRL, EOMI, sclera are clear. Oropharynx is clear. NECK:  No jugular venous distention, carotid upstroke brisk and symmetric, no bruits, no thyromegaly or adenopathy LUNGS:  Clear to auscultation bilaterally CHEST:  Unremarkable HEART:  RRR,  PMI not displaced or sustained,S1 and S2 within normal limits, no S3, no S4: no clicks, no rubs, no murmurs ABD:  Soft, nontender. BS +, no masses or bruits. No hepatomegaly, no splenomegaly EXT:  2  + pulses throughout, no edema, no cyanosis no clubbing. Left elbow prosthesis is dislocated. SKIN:  Warm and dry.  No rashes NEURO:  Alert and oriented x 3. Cranial nerves II through XII intact. PSYCH:  Cognitively intact    LABORATORY DATA: Dated 01/22/17: cholesterol 205, triglycerides 84, HDL 54, LDL 134.  Dated 06/02/18: Hgb 10.3. creatinine 0.9. TSH normal Dated 09/09/19: Normal CMET, CBC and TSH  Ecg today shows NSR rate 60. RBBB. No acute change. I have personally reviewed and interpreted this study.   Assessment / Plan: 1. Hypertension. Blood pressure is well controlled Continue Avapro and HCTZ.  Follow up in one year.   2. Chronic right bundle branch block.  3. Dislocation of prior elbow or placement.   4. Hyperlipidemia. On statin therapy. Labs followed by Dr. Dagmar Hait.

## 2019-12-07 ENCOUNTER — Ambulatory Visit (INDEPENDENT_AMBULATORY_CARE_PROVIDER_SITE_OTHER): Payer: Medicare Other | Admitting: Cardiology

## 2019-12-07 ENCOUNTER — Other Ambulatory Visit: Payer: Self-pay

## 2019-12-07 ENCOUNTER — Encounter: Payer: Self-pay | Admitting: Cardiology

## 2019-12-07 VITALS — BP 130/72 | HR 60 | Ht 59.0 in | Wt 171.0 lb

## 2019-12-07 DIAGNOSIS — I4510 Unspecified right bundle-branch block: Secondary | ICD-10-CM | POA: Diagnosis not present

## 2019-12-07 DIAGNOSIS — R001 Bradycardia, unspecified: Secondary | ICD-10-CM | POA: Diagnosis not present

## 2019-12-07 DIAGNOSIS — I10 Essential (primary) hypertension: Secondary | ICD-10-CM

## 2019-12-07 MED ORDER — IRBESARTAN 150 MG PO TABS: 150.0000 mg | ORAL_TABLET | Freq: Every day | ORAL | 3 refills | Status: DC

## 2019-12-07 MED ORDER — HYDROCHLOROTHIAZIDE 25 MG PO TABS: 25.0000 mg | ORAL_TABLET | Freq: Every day | ORAL | 3 refills | Status: DC

## 2019-12-07 NOTE — Addendum Note (Signed)
Addended by: Kathyrn Lass on: 12/07/2019 03:17 PM   Modules accepted: Orders

## 2020-01-15 ENCOUNTER — Ambulatory Visit: Payer: Medicare Other | Attending: Internal Medicine

## 2020-01-15 DIAGNOSIS — Z23 Encounter for immunization: Secondary | ICD-10-CM | POA: Insufficient documentation

## 2020-01-15 NOTE — Progress Notes (Signed)
   Covid-19 Vaccination Clinic  Name:  [name]    MRN: [id number] DOB: 10-18-1931  01/15/2020  [name] was observed post Covid-19 immunization for 15 minutes without incidence. She was provided with Vaccine Information Sheet and instruction to access the V-Safe system.   [name] was instructed to call 911 with any severe reactions post vaccine: Marland Kitchen Difficulty breathing  . Swelling of your face and throat  . A fast heartbeat  . A bad rash all over your body  . Dizziness and weakness    Immunizations Administered    Name Date Dose VIS Date Route   Pfizer COVID-19 Vaccine 01/15/2020 12:24 PM 0.3 mL 12/01/2019 Intramuscular   Manufacturer: [place]   Lot: [id number]   [place]: [id number]

## 2020-02-05 ENCOUNTER — Ambulatory Visit: Payer: Medicare Other | Attending: Internal Medicine

## 2020-02-05 DIAGNOSIS — Z23 Encounter for immunization: Secondary | ICD-10-CM | POA: Insufficient documentation

## 2020-02-05 NOTE — Progress Notes (Signed)
   Covid-19 Vaccination Clinic  Name:  [name]    MRN: [id number] DOB: December 20, 1931  02/05/2020  [name] was observed post Covid-19 immunization for 15 minutes without incidence. She was provided with Vaccine Information Sheet and instruction to access the V-Safe system.   [name] was instructed to call 911 with any severe reactions post vaccine: Marland Kitchen Difficulty breathing  . Swelling of your face and throat  . A fast heartbeat  . A bad rash all over your body  . Dizziness and weakness    Immunizations Administered    Name Date Dose VIS Date Route   Pfizer COVID-19 Vaccine 02/05/2020 11:58 AM 0.3 mL 12/01/2019 Intramuscular   Manufacturer: [place]   Lot: [id number]   [place]: [id number]

## 2020-05-13 DIAGNOSIS — Z8543 Personal history of malignant neoplasm of ovary: Secondary | ICD-10-CM | POA: Diagnosis not present

## 2020-05-13 DIAGNOSIS — D509 Iron deficiency anemia, unspecified: Secondary | ICD-10-CM | POA: Diagnosis not present

## 2020-05-13 DIAGNOSIS — F0390 Unspecified dementia without behavioral disturbance: Secondary | ICD-10-CM | POA: Diagnosis not present

## 2020-05-13 DIAGNOSIS — N1831 Chronic kidney disease, stage 3a: Secondary | ICD-10-CM | POA: Diagnosis not present

## 2020-05-13 DIAGNOSIS — E669 Obesity, unspecified: Secondary | ICD-10-CM | POA: Diagnosis not present

## 2020-05-13 DIAGNOSIS — E039 Hypothyroidism, unspecified: Secondary | ICD-10-CM | POA: Diagnosis not present

## 2020-05-13 DIAGNOSIS — F22 Delusional disorders: Secondary | ICD-10-CM | POA: Diagnosis not present

## 2020-05-13 DIAGNOSIS — I129 Hypertensive chronic kidney disease with stage 1 through stage 4 chronic kidney disease, or unspecified chronic kidney disease: Secondary | ICD-10-CM | POA: Diagnosis not present

## 2020-05-14 DIAGNOSIS — E038 Other specified hypothyroidism: Secondary | ICD-10-CM | POA: Diagnosis not present

## 2020-05-29 DIAGNOSIS — D509 Iron deficiency anemia, unspecified: Secondary | ICD-10-CM | POA: Diagnosis not present

## 2020-05-29 DIAGNOSIS — E039 Hypothyroidism, unspecified: Secondary | ICD-10-CM | POA: Diagnosis not present

## 2020-05-29 DIAGNOSIS — I2510 Atherosclerotic heart disease of native coronary artery without angina pectoris: Secondary | ICD-10-CM | POA: Diagnosis not present

## 2020-05-29 DIAGNOSIS — E669 Obesity, unspecified: Secondary | ICD-10-CM | POA: Diagnosis not present

## 2020-05-29 DIAGNOSIS — F0390 Unspecified dementia without behavioral disturbance: Secondary | ICD-10-CM | POA: Diagnosis not present

## 2020-05-29 DIAGNOSIS — I129 Hypertensive chronic kidney disease with stage 1 through stage 4 chronic kidney disease, or unspecified chronic kidney disease: Secondary | ICD-10-CM | POA: Diagnosis not present

## 2020-05-29 DIAGNOSIS — Z0000 Encounter for general adult medical examination without abnormal findings: Secondary | ICD-10-CM | POA: Diagnosis not present

## 2020-05-29 DIAGNOSIS — F22 Delusional disorders: Secondary | ICD-10-CM | POA: Diagnosis not present

## 2020-05-29 DIAGNOSIS — H6122 Impacted cerumen, left ear: Secondary | ICD-10-CM | POA: Diagnosis not present

## 2020-05-29 DIAGNOSIS — N1831 Chronic kidney disease, stage 3a: Secondary | ICD-10-CM | POA: Diagnosis not present

## 2020-05-29 DIAGNOSIS — E785 Hyperlipidemia, unspecified: Secondary | ICD-10-CM | POA: Diagnosis not present

## 2020-05-29 DIAGNOSIS — L299 Pruritus, unspecified: Secondary | ICD-10-CM | POA: Diagnosis not present

## 2020-08-08 DIAGNOSIS — H6122 Impacted cerumen, left ear: Secondary | ICD-10-CM | POA: Diagnosis not present

## 2020-10-02 DIAGNOSIS — Z23 Encounter for immunization: Secondary | ICD-10-CM | POA: Diagnosis not present

## 2020-11-05 DIAGNOSIS — F22 Delusional disorders: Secondary | ICD-10-CM | POA: Diagnosis not present

## 2020-11-05 DIAGNOSIS — N3090 Cystitis, unspecified without hematuria: Secondary | ICD-10-CM | POA: Diagnosis not present

## 2020-11-05 DIAGNOSIS — F0391 Unspecified dementia with behavioral disturbance: Secondary | ICD-10-CM | POA: Diagnosis not present

## 2020-11-07 DIAGNOSIS — I10 Essential (primary) hypertension: Secondary | ICD-10-CM | POA: Diagnosis not present

## 2020-11-22 DIAGNOSIS — Z23 Encounter for immunization: Secondary | ICD-10-CM | POA: Diagnosis not present

## 2020-11-26 DIAGNOSIS — I2510 Atherosclerotic heart disease of native coronary artery without angina pectoris: Secondary | ICD-10-CM | POA: Diagnosis not present

## 2020-11-26 DIAGNOSIS — D509 Iron deficiency anemia, unspecified: Secondary | ICD-10-CM | POA: Diagnosis not present

## 2020-11-26 DIAGNOSIS — N1831 Chronic kidney disease, stage 3a: Secondary | ICD-10-CM | POA: Diagnosis not present

## 2020-11-26 DIAGNOSIS — I129 Hypertensive chronic kidney disease with stage 1 through stage 4 chronic kidney disease, or unspecified chronic kidney disease: Secondary | ICD-10-CM | POA: Diagnosis not present

## 2020-11-26 DIAGNOSIS — L299 Pruritus, unspecified: Secondary | ICD-10-CM | POA: Diagnosis not present

## 2020-11-26 DIAGNOSIS — E785 Hyperlipidemia, unspecified: Secondary | ICD-10-CM | POA: Diagnosis not present

## 2020-11-26 DIAGNOSIS — F0391 Unspecified dementia with behavioral disturbance: Secondary | ICD-10-CM | POA: Diagnosis not present

## 2020-11-26 DIAGNOSIS — F22 Delusional disorders: Secondary | ICD-10-CM | POA: Diagnosis not present

## 2020-11-26 DIAGNOSIS — E039 Hypothyroidism, unspecified: Secondary | ICD-10-CM | POA: Diagnosis not present

## 2020-11-26 DIAGNOSIS — N3090 Cystitis, unspecified without hematuria: Secondary | ICD-10-CM | POA: Diagnosis not present

## 2020-11-26 DIAGNOSIS — E669 Obesity, unspecified: Secondary | ICD-10-CM | POA: Diagnosis not present

## 2020-12-16 ENCOUNTER — Other Ambulatory Visit: Payer: Self-pay | Admitting: Cardiology

## 2021-01-13 NOTE — Progress Notes (Signed)
[name] Date of Birth: 10-Mar-1931 Medical Record #[id number]  History of Present Illness: Mrs. Nakasone is seen for follow up. She has a history of hypertension, hyperlipidemia, and right bundle branch block. She's had previous left elbow replacement with revision in 2011. She later had complete dislocation of her elbow prosthesis. She has a history of severe bradycardia on metoprolol.    On follow up she has been feeling well. She denies any chest pain, palpitations, SOB or fatigue. No dizziness. No active medical issues.  Current Outpatient Medications on File Prior to Visit  Medication Sig Dispense Refill  . aspirin 81 MG tablet Take 81 mg by mouth daily.    . beta carotene w/minerals (OCUVITE) tablet Take 1 tablet by mouth daily.    . cholecalciferol (VITAMIN D3) 25 MCG (1000 UT) tablet Take 1,000 Units by mouth daily.    . diphenhydrAMINE (BENADRYL) 25 MG tablet Take 25 mg by mouth daily.    Marland Kitchen docusate sodium 100 MG CAPS Take 100 mg by mouth 2 (two) times daily as needed for constipation. For constipation 60 capsule 3  . donepezil (ARICEPT) 10 MG tablet Take 1 tablet by mouth daily.    . ferrous sulfate 325 (65 FE) MG EC tablet Take 325 mg by mouth daily.    . hydrochlorothiazide (HYDRODIURIL) 25 MG tablet TAKE 1 TABLET BY MOUTH EVERY DAY 60 tablet 0  . irbesartan (AVAPRO) 150 MG tablet Take 1 tablet (150 mg total) by mouth daily. 90 tablet 3  . loratadine (CLARITIN) 10 MG tablet Take 10 mg by mouth daily.    . Multiple Vitamins-Minerals (WOMENS 50+ ADVANCED PO) Take 1 tablet by mouth daily.    Jeralyn Bennett Shell 500 MG TABS Take 1 tablet by mouth daily.    . pantoprazole (PROTONIX) 40 MG tablet Take 40 mg by mouth daily.    . rosuvastatin (CRESTOR) 20 MG tablet Take 20 mg by mouth daily.    Marland Kitchen SYNTHROID 125 MCG tablet Take 125 mcg by mouth daily.     . vitamin C (ASCORBIC ACID) 500 MG tablet Take 500 mg by mouth daily.     No current facility-administered medications on file prior  to visit.    No Known Allergies  Past Medical History:  Diagnosis Date  . Hiatal hernia   . Hyperlipidemia   . Hypertension   . Hypothyroidism   . Macular degeneration   . Obesity   . Osteoporosis   . Ovarian cancer (HCC)    Stage IA grade 2 ovarian cancer    Past Surgical History:  Procedure Laterality Date  . CHOLECYSTECTOMY, LAPAROSCOPIC    . colectomy  2005    Rectosigmoid colectomy with reanastomosis  . ELBOW ARTHROPLASTY     replacing the humeral stem  . KYPHOPLASTY N/A 03/02/2013   Procedure: KYPHOPLASTY;  Surgeon: Maeola Harman, MD;  Location: [facility];  Service: Neurosurgery;  Laterality: N/A;  Lumbar One Kyphoplasty  . OTHER SURGICAL HISTORY    . OTHER SURGICAL HISTORY     Ovarian cancer resection and staging    Social History   Tobacco Use  Smoking Status Former Smoker  . Types: Cigarettes  . Quit date: 32  . Years since quitting: 29.0  Smokeless Tobacco Never Used    Social History   Substance and Sexual Activity  Alcohol Use Not Currently  . Alcohol/week: 0.0 standard drinks    Family History  Problem Relation Age of Onset  . Heart attack Father  Review of Systems: As noted in history of present illness.  All other systems were reviewed and are negative.  Physical Exam: BP 110/60 (BP Location: Right Arm, Patient Position: Sitting)   Pulse (!) 59   Ht 5\' 1"  (1.549 m)   Wt 168 lb (76.2 kg)   SpO2 96%   BMI 31.74 kg/m  GENERAL:  Well appearing WF in NAD. Walks with a cane. HEENT:  PERRL, EOMI, sclera are clear. Oropharynx is clear. NECK:  No jugular venous distention, carotid upstroke brisk and symmetric, no bruits, no thyromegaly or adenopathy LUNGS:  Clear to auscultation bilaterally CHEST:  Unremarkable HEART:  RRR,  PMI not displaced or sustained,S1 and S2 within normal limits, no S3, no S4: no clicks, no rubs, no murmurs ABD:  Soft, nontender. BS +, no masses or bruits. No hepatomegaly, no splenomegaly EXT:  2 + pulses  throughout, no edema, no cyanosis no clubbing. Left elbow prosthesis is dislocated. SKIN:  Warm and dry.  No rashes NEURO:  Alert and oriented x 3. Cranial nerves II through XII intact. PSYCH:  Cognitively intact   LABORATORY DATA: Dated 01/22/17: cholesterol 205, triglycerides 84, HDL 54, LDL 134.  Dated 06/02/18: Hgb 10.3. creatinine 0.9. TSH normal Dated 09/09/19: Normal CMET, CBC and TSH Dated 05/13/20: BUN 33, creatinine 1.1. glucose 138. Otherwise CBC and CMET normal. TSH 0.26.   Ecg today shows NSR rate 59 with PACs. RBBB. No acute change. I have personally reviewed and interpreted this study.   Assessment / Plan: 1. Hypertension. Blood pressure is well controlled Continue Avapro and HCTZ.    2. Chronic right bundle branch block.  3. Dislocation of prior elbow replacement.   4. Hyperlipidemia. On statin therapy. Labs followed by Dr. Felipa Eth.

## 2021-01-16 ENCOUNTER — Encounter: Payer: Self-pay | Admitting: Cardiology

## 2021-01-16 ENCOUNTER — Ambulatory Visit (INDEPENDENT_AMBULATORY_CARE_PROVIDER_SITE_OTHER): Payer: Medicare Other | Admitting: Cardiology

## 2021-01-16 ENCOUNTER — Other Ambulatory Visit: Payer: Self-pay

## 2021-01-16 VITALS — BP 110/60 | HR 59 | Ht 61.0 in | Wt 168.0 lb

## 2021-01-16 DIAGNOSIS — I4510 Unspecified right bundle-branch block: Secondary | ICD-10-CM | POA: Diagnosis not present

## 2021-01-16 DIAGNOSIS — I10 Essential (primary) hypertension: Secondary | ICD-10-CM

## 2021-01-27 ENCOUNTER — Encounter (HOSPITAL_COMMUNITY): Payer: Self-pay

## 2021-01-27 ENCOUNTER — Inpatient Hospital Stay (HOSPITAL_COMMUNITY)
Admission: EM | Admit: 2021-01-27 | Discharge: 2021-02-02 | DRG: 480 | Disposition: A | Discharge status: Skilled Nursing Facility | Payer: Medicare Other | Attending: Family Medicine | Admitting: Family Medicine

## 2021-01-27 ENCOUNTER — Other Ambulatory Visit: Payer: Self-pay

## 2021-01-27 ENCOUNTER — Emergency Department (HOSPITAL_COMMUNITY): Payer: Medicare Other

## 2021-01-27 DIAGNOSIS — G300 Alzheimer's disease with early onset: Secondary | ICD-10-CM | POA: Diagnosis not present

## 2021-01-27 DIAGNOSIS — E785 Hyperlipidemia, unspecified: Secondary | ICD-10-CM | POA: Diagnosis present

## 2021-01-27 DIAGNOSIS — Z7982 Long term (current) use of aspirin: Secondary | ICD-10-CM | POA: Diagnosis not present

## 2021-01-27 DIAGNOSIS — D62 Acute posthemorrhagic anemia: Secondary | ICD-10-CM | POA: Diagnosis not present

## 2021-01-27 DIAGNOSIS — R001 Bradycardia, unspecified: Secondary | ICD-10-CM | POA: Diagnosis present

## 2021-01-27 DIAGNOSIS — N390 Urinary tract infection, site not specified: Secondary | ICD-10-CM | POA: Diagnosis present

## 2021-01-27 DIAGNOSIS — S42202A Unspecified fracture of upper end of left humerus, initial encounter for closed fracture: Secondary | ICD-10-CM

## 2021-01-27 DIAGNOSIS — Z743 Need for continuous supervision: Secondary | ICD-10-CM | POA: Diagnosis not present

## 2021-01-27 DIAGNOSIS — M79603 Pain in arm, unspecified: Secondary | ICD-10-CM | POA: Diagnosis not present

## 2021-01-27 DIAGNOSIS — I129 Hypertensive chronic kidney disease with stage 1 through stage 4 chronic kidney disease, or unspecified chronic kidney disease: Secondary | ICD-10-CM | POA: Diagnosis present

## 2021-01-27 DIAGNOSIS — Z8543 Personal history of malignant neoplasm of ovary: Secondary | ICD-10-CM | POA: Diagnosis not present

## 2021-01-27 DIAGNOSIS — I10 Essential (primary) hypertension: Secondary | ICD-10-CM | POA: Diagnosis present

## 2021-01-27 DIAGNOSIS — G8929 Other chronic pain: Secondary | ICD-10-CM | POA: Diagnosis not present

## 2021-01-27 DIAGNOSIS — R41841 Cognitive communication deficit: Secondary | ICD-10-CM | POA: Diagnosis not present

## 2021-01-27 DIAGNOSIS — Z9181 History of falling: Secondary | ICD-10-CM | POA: Diagnosis not present

## 2021-01-27 DIAGNOSIS — Z6831 Body mass index (BMI) 31.0-31.9, adult: Secondary | ICD-10-CM

## 2021-01-27 DIAGNOSIS — F22 Delusional disorders: Secondary | ICD-10-CM | POA: Diagnosis not present

## 2021-01-27 DIAGNOSIS — R1310 Dysphagia, unspecified: Secondary | ICD-10-CM | POA: Diagnosis present

## 2021-01-27 DIAGNOSIS — Y92002 Bathroom of unspecified non-institutional (private) residence single-family (private) house as the place of occurrence of the external cause: Secondary | ICD-10-CM | POA: Diagnosis not present

## 2021-01-27 DIAGNOSIS — Z8249 Family history of ischemic heart disease and other diseases of the circulatory system: Secondary | ICD-10-CM | POA: Diagnosis not present

## 2021-01-27 DIAGNOSIS — W1830XA Fall on same level, unspecified, initial encounter: Secondary | ICD-10-CM | POA: Diagnosis present

## 2021-01-27 DIAGNOSIS — N1831 Chronic kidney disease, stage 3a: Secondary | ICD-10-CM | POA: Diagnosis present

## 2021-01-27 DIAGNOSIS — I4510 Unspecified right bundle-branch block: Secondary | ICD-10-CM | POA: Diagnosis present

## 2021-01-27 DIAGNOSIS — S42302K Unspecified fracture of shaft of humerus, left arm, subsequent encounter for fracture with nonunion: Secondary | ICD-10-CM | POA: Diagnosis not present

## 2021-01-27 DIAGNOSIS — Z9981 Dependence on supplemental oxygen: Secondary | ICD-10-CM | POA: Diagnosis not present

## 2021-01-27 DIAGNOSIS — S72351A Displaced comminuted fracture of shaft of right femur, initial encounter for closed fracture: Principal | ICD-10-CM

## 2021-01-27 DIAGNOSIS — T148XXA Other injury of unspecified body region, initial encounter: Secondary | ICD-10-CM

## 2021-01-27 DIAGNOSIS — Z87440 Personal history of urinary (tract) infections: Secondary | ICD-10-CM | POA: Diagnosis not present

## 2021-01-27 DIAGNOSIS — M549 Dorsalgia, unspecified: Secondary | ICD-10-CM | POA: Diagnosis not present

## 2021-01-27 DIAGNOSIS — Z043 Encounter for examination and observation following other accident: Secondary | ICD-10-CM | POA: Diagnosis not present

## 2021-01-27 DIAGNOSIS — S42292A Other displaced fracture of upper end of left humerus, initial encounter for closed fracture: Secondary | ICD-10-CM | POA: Diagnosis present

## 2021-01-27 DIAGNOSIS — W19XXXD Unspecified fall, subsequent encounter: Secondary | ICD-10-CM

## 2021-01-27 DIAGNOSIS — D638 Anemia in other chronic diseases classified elsewhere: Secondary | ICD-10-CM

## 2021-01-27 DIAGNOSIS — Z96622 Presence of left artificial elbow joint: Secondary | ICD-10-CM | POA: Diagnosis present

## 2021-01-27 DIAGNOSIS — F0390 Unspecified dementia without behavioral disturbance: Secondary | ICD-10-CM | POA: Diagnosis present

## 2021-01-27 DIAGNOSIS — R1312 Dysphagia, oropharyngeal phase: Secondary | ICD-10-CM | POA: Diagnosis not present

## 2021-01-27 DIAGNOSIS — M6281 Muscle weakness (generalized): Secondary | ICD-10-CM | POA: Diagnosis not present

## 2021-01-27 DIAGNOSIS — R279 Unspecified lack of coordination: Secondary | ICD-10-CM | POA: Diagnosis not present

## 2021-01-27 DIAGNOSIS — F05 Delirium due to known physiological condition: Secondary | ICD-10-CM | POA: Diagnosis not present

## 2021-01-27 DIAGNOSIS — S72451A Displaced supracondylar fracture without intracondylar extension of lower end of right femur, initial encounter for closed fracture: Secondary | ICD-10-CM | POA: Diagnosis not present

## 2021-01-27 DIAGNOSIS — Z20822 Contact with and (suspected) exposure to covid-19: Secondary | ICD-10-CM | POA: Diagnosis present

## 2021-01-27 DIAGNOSIS — Z87891 Personal history of nicotine dependence: Secondary | ICD-10-CM

## 2021-01-27 DIAGNOSIS — M4856XA Collapsed vertebra, not elsewhere classified, lumbar region, initial encounter for fracture: Secondary | ICD-10-CM | POA: Diagnosis present

## 2021-01-27 DIAGNOSIS — S42302D Unspecified fracture of shaft of humerus, left arm, subsequent encounter for fracture with routine healing: Secondary | ICD-10-CM | POA: Diagnosis not present

## 2021-01-27 DIAGNOSIS — S728X1D Other fracture of right femur, subsequent encounter for closed fracture with routine healing: Secondary | ICD-10-CM | POA: Diagnosis not present

## 2021-01-27 DIAGNOSIS — Z741 Need for assistance with personal care: Secondary | ICD-10-CM | POA: Diagnosis not present

## 2021-01-27 DIAGNOSIS — R0902 Hypoxemia: Secondary | ICD-10-CM | POA: Diagnosis not present

## 2021-01-27 DIAGNOSIS — S7291XA Unspecified fracture of right femur, initial encounter for closed fracture: Secondary | ICD-10-CM | POA: Diagnosis present

## 2021-01-27 DIAGNOSIS — S42302A Unspecified fracture of shaft of humerus, left arm, initial encounter for closed fracture: Secondary | ICD-10-CM | POA: Diagnosis present

## 2021-01-27 DIAGNOSIS — S72401D Unspecified fracture of lower end of right femur, subsequent encounter for closed fracture with routine healing: Secondary | ICD-10-CM | POA: Diagnosis not present

## 2021-01-27 DIAGNOSIS — S72401A Unspecified fracture of lower end of right femur, initial encounter for closed fracture: Secondary | ICD-10-CM

## 2021-01-27 DIAGNOSIS — S79929A Unspecified injury of unspecified thigh, initial encounter: Secondary | ICD-10-CM | POA: Diagnosis not present

## 2021-01-27 DIAGNOSIS — I959 Hypotension, unspecified: Secondary | ICD-10-CM | POA: Diagnosis present

## 2021-01-27 DIAGNOSIS — S42212A Unspecified displaced fracture of surgical neck of left humerus, initial encounter for closed fracture: Secondary | ICD-10-CM | POA: Diagnosis not present

## 2021-01-27 DIAGNOSIS — S72301A Unspecified fracture of shaft of right femur, initial encounter for closed fracture: Secondary | ICD-10-CM | POA: Diagnosis not present

## 2021-01-27 DIAGNOSIS — S199XXA Unspecified injury of neck, initial encounter: Secondary | ICD-10-CM | POA: Diagnosis not present

## 2021-01-27 DIAGNOSIS — G9341 Metabolic encephalopathy: Secondary | ICD-10-CM | POA: Diagnosis present

## 2021-01-27 DIAGNOSIS — Z7409 Other reduced mobility: Secondary | ICD-10-CM | POA: Diagnosis not present

## 2021-01-27 DIAGNOSIS — T1490XA Injury, unspecified, initial encounter: Secondary | ICD-10-CM

## 2021-01-27 DIAGNOSIS — K449 Diaphragmatic hernia without obstruction or gangrene: Secondary | ICD-10-CM | POA: Diagnosis present

## 2021-01-27 DIAGNOSIS — F3112 Bipolar disorder, current episode manic without psychotic features, moderate: Secondary | ICD-10-CM | POA: Diagnosis not present

## 2021-01-27 DIAGNOSIS — K5900 Constipation, unspecified: Secondary | ICD-10-CM | POA: Diagnosis not present

## 2021-01-27 DIAGNOSIS — S32019A Unspecified fracture of first lumbar vertebra, initial encounter for closed fracture: Secondary | ICD-10-CM | POA: Diagnosis present

## 2021-01-27 DIAGNOSIS — M810 Age-related osteoporosis without current pathological fracture: Secondary | ICD-10-CM | POA: Diagnosis not present

## 2021-01-27 DIAGNOSIS — W19XXXA Unspecified fall, initial encounter: Principal | ICD-10-CM

## 2021-01-27 DIAGNOSIS — I120 Hypertensive chronic kidney disease with stage 5 chronic kidney disease or end stage renal disease: Secondary | ICD-10-CM | POA: Diagnosis not present

## 2021-01-27 DIAGNOSIS — E8889 Other specified metabolic disorders: Secondary | ICD-10-CM | POA: Diagnosis present

## 2021-01-27 DIAGNOSIS — E039 Hypothyroidism, unspecified: Secondary | ICD-10-CM | POA: Diagnosis present

## 2021-01-27 DIAGNOSIS — F0280 Dementia in other diseases classified elsewhere without behavioral disturbance: Secondary | ICD-10-CM | POA: Diagnosis not present

## 2021-01-27 DIAGNOSIS — S42322A Displaced transverse fracture of shaft of humerus, left arm, initial encounter for closed fracture: Secondary | ICD-10-CM | POA: Diagnosis not present

## 2021-01-27 DIAGNOSIS — E669 Obesity, unspecified: Secondary | ICD-10-CM | POA: Diagnosis present

## 2021-01-27 DIAGNOSIS — M25561 Pain in right knee: Secondary | ICD-10-CM | POA: Diagnosis not present

## 2021-01-27 DIAGNOSIS — R410 Disorientation, unspecified: Secondary | ICD-10-CM | POA: Diagnosis not present

## 2021-01-27 DIAGNOSIS — S0990XA Unspecified injury of head, initial encounter: Secondary | ICD-10-CM | POA: Diagnosis not present

## 2021-01-27 DIAGNOSIS — R21 Rash and other nonspecific skin eruption: Secondary | ICD-10-CM | POA: Diagnosis not present

## 2021-01-27 DIAGNOSIS — H3530 Unspecified macular degeneration: Secondary | ICD-10-CM | POA: Diagnosis not present

## 2021-01-27 HISTORY — DX: Gastro-esophageal reflux disease without esophagitis: K21.9

## 2021-01-27 HISTORY — DX: Anemia, unspecified: D64.9

## 2021-01-27 HISTORY — DX: Unspecified dementia, unspecified severity, without behavioral disturbance, psychotic disturbance, mood disturbance, and anxiety: F03.90

## 2021-01-27 HISTORY — DX: Other seasonal allergic rhinitis: J30.2

## 2021-01-27 LAB — URINALYSIS, ROUTINE W REFLEX MICROSCOPIC
Bilirubin Urine: NEGATIVE
Glucose, UA: NEGATIVE mg/dL
Ketones, ur: 5 mg/dL — AB
Nitrite: NEGATIVE
Protein, ur: 30 mg/dL — AB
Specific Gravity, Urine: 1.026 (ref 1.005–1.030)
pH: 5 (ref 5.0–8.0)

## 2021-01-27 LAB — COMPREHENSIVE METABOLIC PANEL
ALT: 21 U/L (ref 0–44)
AST: 23 U/L (ref 15–41)
Albumin: 3.3 g/dL — ABNORMAL LOW (ref 3.5–5.0)
Alkaline Phosphatase: 50 U/L (ref 38–126)
Anion gap: 13 (ref 5–15)
BUN: 20 mg/dL (ref 8–23)
CO2: 24 mmol/L (ref 22–32)
Calcium: 8.9 mg/dL (ref 8.9–10.3)
Chloride: 102 mmol/L (ref 98–111)
Creatinine, Ser: 1.01 mg/dL — ABNORMAL HIGH (ref 0.44–1.00)
GFR, Estimated: 54 mL/min — ABNORMAL LOW (ref >60)
Glucose, Bld: 153 mg/dL — ABNORMAL HIGH (ref 70–99)
Potassium: 4.1 mmol/L (ref 3.5–5.1)
Sodium: 139 mmol/L (ref 135–145)
Total Bilirubin: 0.7 mg/dL (ref 0.3–1.2)
Total Protein: 6.1 g/dL — ABNORMAL LOW (ref 6.5–8.1)

## 2021-01-27 LAB — CBC WITH DIFFERENTIAL/PLATELET
Abs Immature Granulocytes: 0.1 10*3/uL — ABNORMAL HIGH (ref 0.00–0.07)
Basophils Absolute: 0.1 10*3/uL (ref 0.0–0.1)
Basophils Relative: 0 %
Eosinophils Absolute: 0 10*3/uL (ref 0.0–0.5)
Eosinophils Relative: 0 %
HCT: 37.3 % (ref 36.0–46.0)
Hemoglobin: 11.4 g/dL — ABNORMAL LOW (ref 12.0–15.0)
Immature Granulocytes: 1 %
Lymphocytes Relative: 7 %
Lymphs Abs: 1.2 10*3/uL (ref 0.7–4.0)
MCH: 29.7 pg (ref 26.0–34.0)
MCHC: 30.6 g/dL (ref 30.0–36.0)
MCV: 97.1 fL (ref 80.0–100.0)
Monocytes Absolute: 1.5 10*3/uL — ABNORMAL HIGH (ref 0.1–1.0)
Monocytes Relative: 9 %
Neutro Abs: 14.1 10*3/uL — ABNORMAL HIGH (ref 1.7–7.7)
Neutrophils Relative %: 83 %
Platelets: 208 10*3/uL (ref 150–400)
RBC: 3.84 MIL/uL — ABNORMAL LOW (ref 3.87–5.11)
RDW: 13.3 % (ref 11.5–15.5)
WBC: 16.9 10*3/uL — ABNORMAL HIGH (ref 4.0–10.5)
nRBC: 0 % (ref 0.0–0.2)

## 2021-01-27 LAB — HEMOGLOBIN AND HEMATOCRIT, BLOOD
HCT: 31 % — ABNORMAL LOW (ref 36.0–46.0)
Hemoglobin: 9.8 g/dL — ABNORMAL LOW (ref 12.0–15.0)

## 2021-01-27 LAB — PROTIME-INR
INR: 1.1 (ref 0.8–1.2)
Prothrombin Time: 13.4 seconds (ref 11.4–15.2)

## 2021-01-27 LAB — ABO/RH: ABO/RH(D): A POS

## 2021-01-27 MED ORDER — ROSUVASTATIN CALCIUM 20 MG PO TABS
20.0000 mg | ORAL_TABLET | Freq: Every day | ORAL | Status: DC
  Administered 2021-01-28 – 2021-02-01 (×6): 20 mg via ORAL
  Filled 2021-01-27 (×6): qty 1

## 2021-01-27 MED ORDER — FERROUS SULFATE 325 (65 FE) MG PO TABS
325.0000 mg | ORAL_TABLET | Freq: Every day | ORAL | Status: DC
  Administered 2021-01-29 – 2021-02-02 (×5): 325 mg via ORAL
  Filled 2021-01-27 (×5): qty 1

## 2021-01-27 MED ORDER — LACTATED RINGERS IV BOLUS
500.0000 mL | Freq: Once | INTRAVENOUS | Status: AC
  Administered 2021-01-27: 500 mL via INTRAVENOUS

## 2021-01-27 MED ORDER — DIVALPROEX SODIUM 125 MG PO DR TAB
125.0000 mg | DELAYED_RELEASE_TABLET | Freq: Two times a day (BID) | ORAL | Status: DC
Start: 2021-01-27 — End: 2021-02-02
  Administered 2021-01-28 – 2021-02-02 (×11): 125 mg via ORAL
  Filled 2021-01-27 (×13): qty 1

## 2021-01-27 MED ORDER — MORPHINE SULFATE (PF) 2 MG/ML IV SOLN
0.5000 mg | INTRAVENOUS | Status: DC | PRN
  Administered 2021-01-28 – 2021-01-30 (×4): 0.5 mg via INTRAVENOUS
  Filled 2021-01-27 (×4): qty 1

## 2021-01-27 MED ORDER — SODIUM CHLORIDE 0.9 % IV BOLUS
500.0000 mL | Freq: Once | INTRAVENOUS | Status: AC
  Administered 2021-01-27: 500 mL via INTRAVENOUS

## 2021-01-27 MED ORDER — PANTOPRAZOLE SODIUM 40 MG PO TBEC
40.0000 mg | DELAYED_RELEASE_TABLET | Freq: Every day | ORAL | Status: DC
  Administered 2021-01-29 – 2021-02-02 (×5): 40 mg via ORAL
  Filled 2021-01-27 (×5): qty 1

## 2021-01-27 MED ORDER — LEVOTHYROXINE SODIUM 25 MCG PO TABS
125.0000 ug | ORAL_TABLET | Freq: Every day | ORAL | Status: DC
  Administered 2021-01-28 – 2021-02-02 (×6): 125 ug via ORAL
  Filled 2021-01-27 (×6): qty 1

## 2021-01-27 MED ORDER — CALCIUM CARBONATE 1250 (500 CA) MG PO TABS
1250.0000 mg | ORAL_TABLET | Freq: Every day | ORAL | Status: DC
  Administered 2021-01-28 – 2021-02-01 (×6): 1250 mg via ORAL
  Filled 2021-01-27 (×10): qty 1

## 2021-01-27 MED ORDER — ASCORBIC ACID 500 MG PO TABS
500.0000 mg | ORAL_TABLET | Freq: Every day | ORAL | Status: DC
  Administered 2021-01-29 – 2021-02-02 (×5): 500 mg via ORAL
  Filled 2021-01-27 (×5): qty 1

## 2021-01-27 MED ORDER — ADULT MULTIVITAMIN W/MINERALS CH
1.0000 | ORAL_TABLET | Freq: Every day | ORAL | Status: DC
  Administered 2021-01-28 – 2021-02-01 (×5): 1 via ORAL
  Filled 2021-01-27 (×6): qty 1

## 2021-01-27 MED ORDER — VITAMIN D 25 MCG (1000 UNIT) PO TABS: 1000.0000 [IU] | ORAL_TABLET | Freq: Every day | ORAL | Status: DC

## 2021-01-27 MED ORDER — LORATADINE 10 MG PO TABS
10.0000 mg | ORAL_TABLET | Freq: Every day | ORAL | Status: DC
  Administered 2021-01-29 – 2021-02-02 (×5): 10 mg via ORAL
  Filled 2021-01-27 (×5): qty 1

## 2021-01-27 MED ORDER — HYDROCODONE-ACETAMINOPHEN 5-325 MG PO TABS: 1.0000 | ORAL_TABLET | Freq: Four times a day (QID) | ORAL | Status: DC | PRN

## 2021-01-27 MED ORDER — FENTANYL CITRATE (PF) 100 MCG/2ML IJ SOLN
50.0000 ug | Freq: Once | INTRAMUSCULAR | Status: AC
  Administered 2021-01-27: 50 ug via INTRAVENOUS
  Filled 2021-01-27: qty 2

## 2021-01-27 MED ORDER — IOHEXOL 300 MG/ML  SOLN
100.0000 mL | Freq: Once | INTRAMUSCULAR | Status: AC | PRN
  Administered 2021-01-27: 100 mL via INTRAVENOUS

## 2021-01-27 MED ORDER — DOCUSATE SODIUM 100 MG PO CAPS: 100.0000 mg | ORAL_CAPSULE | Freq: Two times a day (BID) | ORAL | Status: DC | PRN

## 2021-01-27 MED ORDER — PROSIGHT PO TABS
1.0000 | ORAL_TABLET | Freq: Every day | ORAL | Status: DC
  Administered 2021-01-28: 1 via ORAL
  Filled 2021-01-27: qty 1

## 2021-01-27 MED ORDER — SODIUM CHLORIDE 0.9 % IV SOLN
1.0000 g | INTRAVENOUS | Status: DC
  Administered 2021-01-27 – 2021-01-29 (×3): 1 g via INTRAVENOUS
  Filled 2021-01-27 (×3): qty 10

## 2021-01-27 MED ORDER — LACTATED RINGERS IV SOLN: INTRAVENOUS | Status: DC

## 2021-01-27 MED ORDER — DONEPEZIL HCL 10 MG PO TABS
10.0000 mg | ORAL_TABLET | Freq: Every day | ORAL | Status: DC
  Administered 2021-01-28 – 2021-02-01 (×6): 10 mg via ORAL
  Filled 2021-01-27 (×6): qty 1

## 2021-01-27 MED ORDER — LORATADINE 10 MG PO TABS: 10.0000 mg | ORAL_TABLET | Freq: Every day | ORAL | Status: DC

## 2021-01-27 NOTE — Consult Note (Signed)
[name] Mar 19, 1931  [id number].    Requesting MD: Dr. Ralene Bathe Chief Complaint/Reason for Consult: ground-level fall with hypotension  HPI:  [name] is an 87 yo female who had a ground-level fall at home earlier today and was brought by EMS. She was standing in the bathroom and fell. She was found immediately by a family member on her left side. She said she hit the left side of her head but did not lose consciousness. Her right leg was noted to be rotated inward on arrival. Initial workup included a head and C spine CT, which showed no acute injuries. Plain films confirmed a right femur fracture and left humerus fracture. Orthopedic surgery has been consulted and the patient is in a knee immobilizer. The patient is being admitted to medicine. After her initial workup while awaiting a bed, the patient became hypotensive with SBP in the 80s. HR remained in the 60s at the time. She was given a fluid bolus and BP improved. Trauma was consulted for further workup. FAST was performed by the ED provider and was negative. On my arrival at the bedside the patient's blood pressure was normal after initiating a fluid bolus. She was alert and answering questions appropriately. Denied abdominal or chest pain. Reports she takes aspirin 81 mg daily but no other antiplatelets or anticoagulants.  ROS: Review of Systems  Constitutional: Negative for chills and fever.  Eyes: Negative for discharge.  Respiratory: Negative for shortness of breath, wheezing and stridor.   Cardiovascular: Negative for chest pain.  Gastrointestinal: Negative for abdominal pain and vomiting.  Skin: Negative for rash.    Family History  Problem Relation Age of Onset  . Heart attack Father     Past Medical History:  Diagnosis Date  . Hiatal hernia   . Hyperlipidemia   . Hypertension   . Hypothyroidism   . Macular degeneration   . Obesity   . Osteoporosis   . Ovarian cancer ([place])    Stage IA grade 2 ovarian cancer     Past Surgical History:  Procedure Laterality Date  . CHOLECYSTECTOMY, LAPAROSCOPIC    . colectomy  2005    Rectosigmoid colectomy with reanastomosis  . ELBOW ARTHROPLASTY     replacing the humeral stem  . KYPHOPLASTY N/A 03/02/2013   Procedure: KYPHOPLASTY;  Surgeon: Erline Levine, MD;  Location: [facility];  Service: Neurosurgery;  Laterality: N/A;  Lumbar One Kyphoplasty  . OTHER SURGICAL HISTORY    . OTHER SURGICAL HISTORY     Ovarian cancer resection and staging    Social History:  reports that she quit smoking about 29 years ago. Her smoking use included cigarettes. She has never used smokeless tobacco. She reports previous alcohol use. She reports that she does not use drugs.  Allergies: No Known Allergies  (Not in a hospital admission)    Physical Exam: Blood pressure (!) 121/96, pulse (!) 57, temperature (!) 97.5 F (36.4 C), temperature source Oral, resp. rate 16, SpO2 95 %. General: resting comfortably, appears stated age, no apparent distress Neurological: alert and oriented, no focal deficits, cranial nerves grossly in tact HEENT: normocephalic, atraumatic, no scleral icterus. No scalp or facial lacerations, no periorbital edema or ecchymosis. CV: regular rate and rhythm, extremities warm and well-perfused Respiratory: normal work of breathing, symmetric chest wall expansion. No chest wall deformities. Abdomen: soft, nondistended, nontender to deep palpation. No masses or organomegaly. No abdominal wall abrasions or ecchymosis. Extremities: warm and well-perfused. Sling in place LUE.  Knee immobilizer in place RLE, removed temporarily for exam. No obvious swelling or ecchymosis of the right thigh. Palpable right pedal pulses. Psychiatric: normal mood and affect Skin: warm and dry, no jaundice, no rashes or lesions   Results for orders placed or performed during the hospital encounter of 01/27/21 (from the past 48 hour(s))  Comprehensive metabolic panel      Status: Abnormal   Collection Time: 01/27/21  5:10 PM  Result Value Ref Range   Sodium 139 135 - 145 mmol/L   Potassium 4.1 3.5 - 5.1 mmol/L   Chloride 102 98 - 111 mmol/L   CO2 24 22 - 32 mmol/L   Glucose, Bld 153 (H) 70 - 99 mg/dL    Comment: Glucose reference range applies only to samples taken after fasting for at least 8 hours.   BUN 20 8 - 23 mg/dL   Creatinine, Ser 1.01 (H) 0.44 - 1.00 mg/dL   Calcium 8.9 8.9 - 10.3 mg/dL   Total Protein 6.1 (L) 6.5 - 8.1 g/dL   Albumin 3.3 (L) 3.5 - 5.0 g/dL   AST 23 15 - 41 U/L   ALT 21 0 - 44 U/L   Alkaline Phosphatase 50 38 - 126 U/L   Total Bilirubin 0.7 0.3 - 1.2 mg/dL   GFR, Estimated 54 (L) >60 mL/min    Comment: (NOTE) Calculated using the CKD-EPI Creatinine Equation (2021)    Anion gap 13 5 - 15    Comment: Performed at [facility], [place] [address], [place], [place] [address]  CBC with Differential     Status: Abnormal   Collection Time: 01/27/21  5:10 PM  Result Value Ref Range   WBC 16.9 (H) 4.0 - 10.5 K/uL   RBC 3.84 (L) 3.87 - 5.11 MIL/uL   Hemoglobin 11.4 (L) 12.0 - 15.0 g/dL   HCT 37.3 36.0 - 46.0 %   MCV 97.1 80.0 - 100.0 fL   MCH 29.7 26.0 - 34.0 pg   MCHC 30.6 30.0 - 36.0 g/dL   RDW 13.3 11.5 - 15.5 %   Platelets 208 150 - 400 K/uL   nRBC 0.0 0.0 - 0.2 %   Neutrophils Relative % 83 %   Neutro Abs 14.1 (H) 1.7 - 7.7 K/uL   Lymphocytes Relative 7 %   Lymphs Abs 1.2 0.7 - 4.0 K/uL   Monocytes Relative 9 %   Monocytes Absolute 1.5 (H) 0.1 - 1.0 K/uL   Eosinophils Relative 0 %   Eosinophils Absolute 0.0 0.0 - 0.5 K/uL   Basophils Relative 0 %   Basophils Absolute 0.1 0.0 - 0.1 K/uL   Immature Granulocytes 1 %   Abs Immature Granulocytes 0.10 (H) 0.00 - 0.07 K/uL    Comment: Performed at [facility] [address] [place], [place] [address]  Protime-INR     Status: None   Collection Time: 01/27/21  5:10 PM  Result Value Ref Range   Prothrombin Time 13.4 11.4 - 15.2 seconds   INR 1.1 0.8 - 1.2     Comment: (NOTE) INR goal varies based on device and disease states. Performed at [facility], [place] [address] [place], [place] [address]   Type and screen [place]     Status: None   Collection Time: 01/27/21  5:10 PM  Result Value Ref Range   ABO/RH(D) A POS    Antibody Screen NEG    Sample Expiration      01/30/2021,2359 Performed  at [facility], [place] [address], [place], [place] [address]   ABO/Rh     Status: None   Collection Time: 01/27/21  6:28 PM  Result Value Ref Range   ABO/RH(D)      A POS Performed at [facility] [address] [place], [place] [address]   Urinalysis, Routine w reflex microscopic Urine, Clean Catch     Status: Abnormal   Collection Time: 01/27/21  7:54 PM  Result Value Ref Range   Color, Urine AMBER (A) YELLOW    Comment: BIOCHEMICALS MAY BE AFFECTED BY COLOR   APPearance HAZY (A) CLEAR   Specific Gravity, Urine 1.026 1.005 - 1.030   pH 5.0 5.0 - 8.0   Glucose, UA NEGATIVE NEGATIVE mg/dL   Hgb urine dipstick MODERATE (A) NEGATIVE   Bilirubin Urine NEGATIVE NEGATIVE   Ketones, ur 5 (A) NEGATIVE mg/dL   Protein, ur 30 (A) NEGATIVE mg/dL   Nitrite NEGATIVE NEGATIVE   Leukocytes,Ua MODERATE (A) NEGATIVE   RBC / HPF 0-5 0 - 5 RBC/hpf   WBC, UA 21-50 0 - 5 WBC/hpf   Bacteria, UA RARE (A) NONE SEEN   Squamous Epithelial / LPF 0-5 0 - 5   Mucus PRESENT    Hyaline Casts, UA PRESENT     Comment: Performed at [facility], [address], [place], [place] [address]   CT Head Wo Contrast  Result Date: 01/27/2021 CLINICAL DATA:  Head trauma, minor. Additional history provided: Fall, neck pain. EXAM: CT HEAD WITHOUT CONTRAST TECHNIQUE: Contiguous axial images were obtained from the base of the skull through the vertex without intravenous contrast. COMPARISON:  No pertinent prior exams available for comparison. FINDINGS: Brain: Mild cerebral atrophy. There is no acute intracranial hemorrhage. No demarcated  cortical infarct. No extra-axial fluid collection. No evidence of intracranial mass. No midline shift. Vascular: No hyperdense vessel.  Atherosclerotic calcifications. Skull: Normal. Negative for fracture or focal lesion. Sinuses/Orbits: Visualized orbits show no acute finding. No significant paranasal sinus disease. IMPRESSION: No evidence of acute intracranial abnormality. Mild cerebral atrophy. Electronically Signed   By: Kellie Simmering DO   On: 01/27/2021 17:40   CT Cervical Spine Wo Contrast  Result Date: 01/27/2021 CLINICAL DATA:  Neck trauma.  Fall/neck pain. EXAM: CT CERVICAL SPINE WITHOUT CONTRAST TECHNIQUE: Multidetector CT imaging of the cervical spine was performed without intravenous contrast. Multiplanar CT image reconstructions were also generated. COMPARISON:  Chest radiographs 01/07/2010. FINDINGS: Alignment: Trace C4-C5 grade 1 retrolisthesis. 2 mm C7-T1 grade 1 anterolisthesis. Trace T2-T3 grade 1 anterolisthesis. Skull base and vertebrae: The basion-dental and atlanto-dental intervals are maintained.No evidence of acute fracture to the cervical spine. Mild age-indeterminate T2 superior endplate compression deformity with superimposed Schmorl node. Age-indeterminate T3 vertebral compression deformity (30-40% height loss). Soft tissues and spinal canal: No prevertebral fluid or swelling. No visible canal hematoma. Disc levels: Cervical spondylosis with multilevel disc space narrowing, disc bulges, posterior disc osteophytes, uncovertebral hypertrophy and facet arthrosis. Disc space narrowing is moderate/advanced at C4-C5 and C6-C7. Additionally, small central disc protrusions are present at C2-C3 and C3-C4. Facet joint ankylosis on the left at C5-C6. Facet joint ankylosis is also suspected bilaterally at T3-T4. Upper chest: No consolidation within the imaged lung apices. No visible pneumothorax. IMPRESSION: 1. No evidence of acute fracture to the cervical spine. 2. Age-indeterminate T2 and T3  vertebral compression deformities, as described. 3. Mild C4-C5 grade 1 retrolisthesis. 4. Mild C7-T1 and T2-T3 grade 1 anterolisthesis. 5. Cervical spondylosis, as  described. 6. Levels of facet joint ankylosis within the cervical and visualized upper thoracic spine. Electronically Signed   By: Kellie Simmering DO   On: 01/27/2021 17:54   CT Abdomen Pelvis W Contrast  Result Date: 01/27/2021 CLINICAL DATA:  [place] from standing EXAM: CT ABDOMEN AND PELVIS WITH CONTRAST TECHNIQUE: Multidetector CT imaging of the abdomen and pelvis was performed using the standard protocol following bolus administration of intravenous contrast. CONTRAST:  124mL OMNIPAQUE IOHEXOL 300 MG/ML  SOLN COMPARISON:  None. FINDINGS: Lower chest: No acute pleural or parenchymal lung disease. Hepatobiliary: No focal liver abnormality is seen. Status post cholecystectomy. No biliary dilatation. Pancreas: Unremarkable. No pancreatic ductal dilatation or surrounding inflammatory changes. Spleen: Normal in size without focal abnormality. Adrenals/Urinary Tract: Adrenal glands are unremarkable. Kidneys are normal, without renal calculi, focal lesion, or hydronephrosis. Bladder is unremarkable. Stomach/Bowel: No bowel obstruction or ileus. No bowel wall thickening or inflammatory change. Vascular/Lymphatic: There is ectasia of the thoracoabdominal aorta without aneurysm. Diffuse atherosclerosis. No pathologic adenopathy. Surgical clips along the left pelvic sidewall and retroperitoneum consistent with lymphadenectomy. Reproductive: Status post hysterectomy. No adnexal masses. Other: No free fluid or free gas. Midline supraumbilical ventral hernia contains a portion of the transverse colon, without bowel obstruction or strangulation. There is a small fat containing umbilical hernia, as well as bilateral fat containing inguinal hernias. Musculoskeletal: There is a chronic L1 compression deformity with vertebra plana and evidence of prior vertebral  augmentation. Chronic T11 compression deformity also noted, approximately 50% loss of height. No acute displaced fractures. Reconstructed images demonstrate no additional findings. IMPRESSION: 1. Midline supraumbilical ventral hernia contains a portion of the transverse colon, without bowel obstruction or strangulation. 2. Small fat containing umbilical and bilateral inguinal hernias. 3. Chronic T11 and L1 compression fractures as above. Electronically Signed   By: Randa Ngo M.D.   On: 01/27/2021 20:37   DG Pelvis Portable  Result Date: 01/27/2021 CLINICAL DATA:  [place] EXAM: PORTABLE PELVIS 1-2 VIEWS COMPARISON:  None. FINDINGS: Supine frontal view of the pelvis was obtained, limited by positioning and collimation. The left hemipelvis and left hip are excluded by collimation. I do not see any acute fracture within the visualized portions of the pelvis or right hip. Soft tissues are unremarkable. IMPRESSION: 1. No acute pelvic fracture on this limited evaluation. Electronically Signed   By: Randa Ngo M.D.   On: 01/27/2021 16:22   DG Chest Port 1 View  Result Date: 01/27/2021 CLINICAL DATA:  87 year old female with fall. EXAM: PORTABLE CHEST 1 VIEW COMPARISON:  Chest radiograph dated 06/07/2010. FINDINGS: No focal consolidation, pleural effusion or pneumothorax. Stable cardiac silhouette. Atherosclerotic calcification of the aorta. Osteopenia with degenerative changes of the spine. Old right posterior rib fractures. Left humeral neck fracture better seen on the upper extremity radiograph. Partially visualized left upper extremity hardware. IMPRESSION: 1. No active cardiopulmonary disease. 2. Fracture of the left humeral neck. Electronically Signed   By: Anner Crete M.D.   On: 01/27/2021 16:16   DG Knee Right Port  Result Date: 01/27/2021 CLINICAL DATA:  [place] EXAM: PORTABLE RIGHT KNEE - 1-2 VIEW COMPARISON:  None. FINDINGS: Frontal and lateral views of the right knee are obtained. There is a  comminuted oblique fracture of the distal right femur, with approximately 15 mm of ventral displacement of the distal fracture fragment. The fracture line extends to the trochlear groove of the patellofemoral articulation, consistent with intra-articular fracture. There is a large joint effusion. Diffuse soft tissue edema. IMPRESSION: 1. Intra-articular comminuted  fracture of the distal right femur, with 15 mm of ventral displacement of the distal fracture fragment. 2. Large joint effusion. Electronically Signed   By: Randa Ngo M.D.   On: 01/27/2021 16:20   DG Humerus Left  Result Date: 01/27/2021 CLINICAL DATA:  87 year old female with fall. EXAM: LEFT HUMERUS - 2+ VIEW COMPARISON:  Chest radiograph dated 01/27/2021. FINDINGS: There is a mildly displaced fracture humeral neck of the left with approximately 4 mm medial displacement of the distal fracture fragment. There is fracture of the distal third of the left humeral diaphysis with displacement of the distal fracture fragment and internal fixation hardware. There is advanced osteopenia. The soft tissues are grossly unremarkable. IMPRESSION: 1. Mildly displaced fracture of the left humeral neck. 2. Displaced fracture of the distal humeral diaphysis with displacement of the internal fixation hardware. Electronically Signed   By: Anner Crete M.D.   On: 01/27/2021 16:20   DG Femur Min 2 Views Right  Result Date: 01/27/2021 CLINICAL DATA:  Distal right femur pain and deformity post fall EXAM: RIGHT FEMUR 2 VIEWS COMPARISON:  None. FINDINGS: There is a comminuted oblique fracture of the distal femoral metadiaphysis with approximately 1 shaft with of medial displacement and impaction of the distal fracture fragment. IMPRESSION: Comminuted displaced oblique fracture of the distal femoral metadiaphysis. Electronically Signed   By: Dahlia Bailiff MD   On: 01/27/2021 18:09      Assessment/Plan 87 yo female presenting after a ground-level fall with  left humerus fracture and right femur fracture. Had a transient episode of hypotension after initial workup that resolved with a fluid bolus. FAST negative. I reviewed her CT abd/pelvis, which showed no solid organ injuries, no intraabdominal free fluid, and no evidence of bleeding in the retroperitoneum. - Ortho consulted for extremity fractures - No other injuries identified on workup - Transient hypotension does not seem to be secondary to bleeding. Continue to monitor. - Trauma will follow as a consult  Michaelle Birks, [facility], Hepatobiliary and Pancreatic Surgery 01/27/21 8:52 PM

## 2021-01-27 NOTE — Progress Notes (Signed)
Orthopedic Tech Progress Note Patient Details:  NATALEY BAHRI 02-16-1931 [id number]  Patient ID: Lacy Duverney, female   DOB: 02/06/1931, 87 y.o.   MRN: [id number] I adjusted knee immobilizer at drs request  Karolee Stamps 01/27/2021, 11:30 PM

## 2021-01-27 NOTE — Progress Notes (Signed)
Orthopedic Tech Progress Note Patient Details:  [name] October 19, 1931 [id number]  Ortho Devices Type of Ortho Device: Shoulder immobilizer Ortho Device/Splint Location: Left Upper Extremity Ortho Device/Splint Interventions: Ordered,Application,Adjustment   Post Interventions Patient Tolerated: Fair Instructions Provided: Adjustment of device,Care of device,Poper ambulation with device   [name] 01/27/2021, 6:42 PM

## 2021-01-27 NOTE — ED Notes (Signed)
Ortho at bedside.

## 2021-01-27 NOTE — ED Notes (Signed)
Pt transported to CT at this time.

## 2021-01-27 NOTE — ED Notes (Signed)
RN notified about vitals 

## 2021-01-27 NOTE — ED Notes (Signed)
Provider at bedside

## 2021-01-27 NOTE — ED Notes (Signed)
Called ortho to come place knee immobilizer

## 2021-01-27 NOTE — Consult Note (Signed)
Reason for Consult:Right femur and left humerus fracture Referring Physician: [name] is an 87 y.o. female.  HPI: [name] is an 87 yo female seen at [facility] by Dr. Amedeo Plenty for her left elbow, who had a fall in her bathroom earlier today with immediate pain in her right thigh and with left arm pain. She denies hitting her head or loss of consciousness. This was a mechanical fall in her bathroom and not associated with prodromal symptoms leading to the fall. Her complaints are the right thigh and left arm pain. She was taken to the ED where evaluation showed a distal right femur fracture and a left periprosthetic humerus fracture. Per the patient and her son she has had chronic problems with the left arm and her left elbow replacement and the radiographic changes may not be acute. She denies any numbness or tingling in the right lower or left upper extremities.  Past Medical History:  Diagnosis Date  . Hiatal hernia   . Hyperlipidemia   . Hypertension   . Hypothyroidism   . Macular degeneration   . Obesity   . Osteoporosis   . Ovarian cancer ([place])    Stage IA grade 2 ovarian cancer    Past Surgical History:  Procedure Laterality Date  . CHOLECYSTECTOMY, LAPAROSCOPIC    . colectomy  2005    Rectosigmoid colectomy with reanastomosis  . ELBOW ARTHROPLASTY     replacing the humeral stem  . KYPHOPLASTY N/A 03/02/2013   Procedure: KYPHOPLASTY;  Surgeon: Erline Levine, MD;  Location: [facility];  Service: Neurosurgery;  Laterality: N/A;  Lumbar One Kyphoplasty  . OTHER SURGICAL HISTORY    . OTHER SURGICAL HISTORY     Ovarian cancer resection and staging    Family History  Problem Relation Age of Onset  . Heart attack Father     Social History:  reports that she quit smoking about 29 years ago. Her smoking use included cigarettes. She has never used smokeless tobacco. She reports previous alcohol use. She reports that she does not use drugs.  Allergies: No Known  Allergies  Medications: I have reviewed the patient's current medications.  Results for orders placed or performed during the hospital encounter of 01/27/21 (from the past 48 hour(s))  Comprehensive metabolic panel     Status: Abnormal   Collection Time: 01/27/21  5:10 PM  Result Value Ref Range   Sodium 139 135 - 145 mmol/L   Potassium 4.1 3.5 - 5.1 mmol/L   Chloride 102 98 - 111 mmol/L   CO2 24 22 - 32 mmol/L   Glucose, Bld 153 (H) 70 - 99 mg/dL    Comment: Glucose reference range applies only to samples taken after fasting for at least 8 hours.   BUN 20 8 - 23 mg/dL   Creatinine, Ser 1.01 (H) 0.44 - 1.00 mg/dL   Calcium 8.9 8.9 - 10.3 mg/dL   Total Protein 6.1 (L) 6.5 - 8.1 g/dL   Albumin 3.3 (L) 3.5 - 5.0 g/dL   AST 23 15 - 41 U/L   ALT 21 0 - 44 U/L   Alkaline Phosphatase 50 38 - 126 U/L   Total Bilirubin 0.7 0.3 - 1.2 mg/dL   GFR, Estimated 54 (L) >60 mL/min    Comment: (NOTE) Calculated using the CKD-EPI Creatinine Equation (2021)    Anion gap 13 5 - 15    Comment: Performed at [facility], [place] [address] [place], [place] [address]  CBC with Differential     Status: Abnormal   Collection Time: 01/27/21  5:10 PM  Result Value Ref Range   WBC 16.9 (H) 4.0 - 10.5 K/uL   RBC 3.84 (L) 3.87 - 5.11 MIL/uL   Hemoglobin 11.4 (L) 12.0 - 15.0 g/dL   HCT 37.3 36.0 - 46.0 %   MCV 97.1 80.0 - 100.0 fL   MCH 29.7 26.0 - 34.0 pg   MCHC 30.6 30.0 - 36.0 g/dL   RDW 13.3 11.5 - 15.5 %   Platelets 208 150 - 400 K/uL   nRBC 0.0 0.0 - 0.2 %   Neutrophils Relative % 83 %   Neutro Abs 14.1 (H) 1.7 - 7.7 K/uL   Lymphocytes Relative 7 %   Lymphs Abs 1.2 0.7 - 4.0 K/uL   Monocytes Relative 9 %   Monocytes Absolute 1.5 (H) 0.1 - 1.0 K/uL   Eosinophils Relative 0 %   Eosinophils Absolute 0.0 0.0 - 0.5 K/uL   Basophils Relative 0 %   Basophils Absolute 0.1 0.0 - 0.1 K/uL   Immature Granulocytes 1 %   Abs Immature Granulocytes 0.10 (H) 0.00 - 0.07 K/uL    Comment: Performed at  [facility], [address] [place], [place] [address]  Protime-INR     Status: None   Collection Time: 01/27/21  5:10 PM  Result Value Ref Range   Prothrombin Time 13.4 11.4 - 15.2 seconds   INR 1.1 0.8 - 1.2    Comment: (NOTE) INR goal varies based on device and disease states. Performed at [facility], [place] [address], [place], [place] [address]   Type and screen [place]     Status: None   Collection Time: 01/27/21  5:10 PM  Result Value Ref Range   ABO/RH(D) A POS    Antibody Screen NEG    Sample Expiration      01/30/2021,2359 Performed at [facility], [place] [address], [place], [place] [address]   ABO/Rh     Status: None   Collection Time: 01/27/21  6:28 PM  Result Value Ref Range   ABO/RH(D)      A POS Performed at [facility] [address] [place], [place] [address]   Urinalysis, Routine w reflex microscopic Urine, Clean Catch     Status: Abnormal   Collection Time: 01/27/21  7:54 PM  Result Value Ref Range   Color, Urine AMBER (A) YELLOW    Comment: BIOCHEMICALS MAY BE AFFECTED BY COLOR   APPearance HAZY (A) CLEAR   Specific Gravity, Urine 1.026 1.005 - 1.030   pH 5.0 5.0 - 8.0   Glucose, UA NEGATIVE NEGATIVE mg/dL   Hgb urine dipstick MODERATE (A) NEGATIVE   Bilirubin Urine NEGATIVE NEGATIVE   Ketones, ur 5 (A) NEGATIVE mg/dL   Protein, ur 30 (A) NEGATIVE mg/dL   Nitrite NEGATIVE NEGATIVE   Leukocytes,Ua MODERATE (A) NEGATIVE   RBC / HPF 0-5 0 - 5 RBC/hpf   WBC, UA 21-50 0 - 5 WBC/hpf   Bacteria, UA RARE (A) NONE SEEN   Squamous Epithelial / LPF 0-5 0 - 5   Mucus PRESENT    Hyaline Casts, UA PRESENT     Comment: Performed at [facility], [address], [place], [place] [address]  Hemoglobin and hematocrit, blood     Status: Abnormal   Collection Time: 01/27/21 10:09 PM  Result Value Ref Range   Hemoglobin 9.8 (L)  12.0 - 15.0 g/dL   HCT 31.0 (L) 36.0 - 46.0 %    Comment: Performed at [facility], [place] [address] [place], [place] [address]    CT Head Wo Contrast  Result Date: 01/27/2021 CLINICAL DATA:  Head trauma, minor. Additional history provided: Fall, neck pain. EXAM: CT HEAD WITHOUT CONTRAST TECHNIQUE: Contiguous axial images were obtained from the base of the skull through the vertex without intravenous contrast. COMPARISON:  No pertinent prior exams available for comparison. FINDINGS: Brain: Mild cerebral atrophy. There is no acute intracranial hemorrhage. No demarcated cortical infarct. No extra-axial fluid collection. No evidence of intracranial mass. No midline shift. Vascular: No hyperdense vessel.  Atherosclerotic calcifications. Skull: Normal. Negative for fracture or focal lesion. Sinuses/Orbits: Visualized orbits show no acute finding. No significant paranasal sinus disease. IMPRESSION: No evidence of acute intracranial abnormality. Mild cerebral atrophy. Electronically Signed   By: Kellie Simmering DO   On: 01/27/2021 17:40   CT Cervical Spine Wo Contrast  Result Date: 01/27/2021 CLINICAL DATA:  Neck trauma.  Fall/neck pain. EXAM: CT CERVICAL SPINE WITHOUT CONTRAST TECHNIQUE: Multidetector CT imaging of the cervical spine was performed without intravenous contrast. Multiplanar CT image reconstructions were also generated. COMPARISON:  Chest radiographs 01/07/2010. FINDINGS: Alignment: Trace C4-C5 grade 1 retrolisthesis. 2 mm C7-T1 grade 1 anterolisthesis. Trace T2-T3 grade 1 anterolisthesis. Skull base and vertebrae: The basion-dental and atlanto-dental intervals are maintained.No evidence of acute fracture to the cervical spine. Mild age-indeterminate T2 superior endplate compression deformity with superimposed Schmorl node. Age-indeterminate T3 vertebral compression deformity (30-40% height loss). Soft tissues and spinal canal: No prevertebral fluid or swelling. No visible canal hematoma. Disc levels: Cervical spondylosis with multilevel disc space narrowing, disc bulges,  posterior disc osteophytes, uncovertebral hypertrophy and facet arthrosis. Disc space narrowing is moderate/advanced at C4-C5 and C6-C7. Additionally, small central disc protrusions are present at C2-C3 and C3-C4. Facet joint ankylosis on the left at C5-C6. Facet joint ankylosis is also suspected bilaterally at T3-T4. Upper chest: No consolidation within the imaged lung apices. No visible pneumothorax. IMPRESSION: 1. No evidence of acute fracture to the cervical spine. 2. Age-indeterminate T2 and T3 vertebral compression deformities, as described. 3. Mild C4-C5 grade 1 retrolisthesis. 4. Mild C7-T1 and T2-T3 grade 1 anterolisthesis. 5. Cervical spondylosis, as described. 6. Levels of facet joint ankylosis within the cervical and visualized upper thoracic spine. Electronically Signed   By: Kellie Simmering DO   On: 01/27/2021 17:54   CT Abdomen Pelvis W Contrast  Result Date: 01/27/2021 CLINICAL DATA:  [place] from standing EXAM: CT ABDOMEN AND PELVIS WITH CONTRAST TECHNIQUE: Multidetector CT imaging of the abdomen and pelvis was performed using the standard protocol following bolus administration of intravenous contrast. CONTRAST:  142mL OMNIPAQUE IOHEXOL 300 MG/ML  SOLN COMPARISON:  None. FINDINGS: Lower chest: No acute pleural or parenchymal lung disease. Hepatobiliary: No focal liver abnormality is seen. Status post cholecystectomy. No biliary dilatation. Pancreas: Unremarkable. No pancreatic ductal dilatation or surrounding inflammatory changes. Spleen: Normal in size without focal abnormality. Adrenals/Urinary Tract: Adrenal glands are unremarkable. Kidneys are normal, without renal calculi, focal lesion, or hydronephrosis. Bladder is unremarkable. Stomach/Bowel: No bowel obstruction or ileus. No bowel wall thickening or inflammatory change. Vascular/Lymphatic: There is ectasia of the thoracoabdominal aorta without aneurysm. Diffuse atherosclerosis. No pathologic adenopathy. Surgical clips along the left pelvic  sidewall and retroperitoneum consistent with lymphadenectomy. Reproductive: Status post hysterectomy. No adnexal masses. Other: No free fluid or free gas. Midline supraumbilical ventral hernia contains a portion of  the transverse colon, without bowel obstruction or strangulation. There is a small fat containing umbilical hernia, as well as bilateral fat containing inguinal hernias. Musculoskeletal: There is a chronic L1 compression deformity with vertebra plana and evidence of prior vertebral augmentation. Chronic T11 compression deformity also noted, approximately 50% loss of height. No acute displaced fractures. Reconstructed images demonstrate no additional findings. IMPRESSION: 1. Midline supraumbilical ventral hernia contains a portion of the transverse colon, without bowel obstruction or strangulation. 2. Small fat containing umbilical and bilateral inguinal hernias. 3. Chronic T11 and L1 compression fractures as above. Electronically Signed   By: Randa Ngo M.D.   On: 01/27/2021 20:37   DG Pelvis Portable  Result Date: 01/27/2021 CLINICAL DATA:  [place] EXAM: PORTABLE PELVIS 1-2 VIEWS COMPARISON:  None. FINDINGS: Supine frontal view of the pelvis was obtained, limited by positioning and collimation. The left hemipelvis and left hip are excluded by collimation. I do not see any acute fracture within the visualized portions of the pelvis or right hip. Soft tissues are unremarkable. IMPRESSION: 1. No acute pelvic fracture on this limited evaluation. Electronically Signed   By: Randa Ngo M.D.   On: 01/27/2021 16:22   DG Chest Port 1 View  Result Date: 01/27/2021 CLINICAL DATA:  87 year old female with fall. EXAM: PORTABLE CHEST 1 VIEW COMPARISON:  Chest radiograph dated 06/07/2010. FINDINGS: No focal consolidation, pleural effusion or pneumothorax. Stable cardiac silhouette. Atherosclerotic calcification of the aorta. Osteopenia with degenerative changes of the spine. Old right posterior rib  fractures. Left humeral neck fracture better seen on the upper extremity radiograph. Partially visualized left upper extremity hardware. IMPRESSION: 1. No active cardiopulmonary disease. 2. Fracture of the left humeral neck. Electronically Signed   By: Anner Crete M.D.   On: 01/27/2021 16:16   DG Knee Right Port  Result Date: 01/27/2021 CLINICAL DATA:  [place] EXAM: PORTABLE RIGHT KNEE - 1-2 VIEW COMPARISON:  None. FINDINGS: Frontal and lateral views of the right knee are obtained. There is a comminuted oblique fracture of the distal right femur, with approximately 15 mm of ventral displacement of the distal fracture fragment. The fracture line extends to the trochlear groove of the patellofemoral articulation, consistent with intra-articular fracture. There is a large joint effusion. Diffuse soft tissue edema. IMPRESSION: 1. Intra-articular comminuted fracture of the distal right femur, with 15 mm of ventral displacement of the distal fracture fragment. 2. Large joint effusion. Electronically Signed   By: Randa Ngo M.D.   On: 01/27/2021 16:20   DG Humerus Left  Result Date: 01/27/2021 CLINICAL DATA:  87 year old female with fall. EXAM: LEFT HUMERUS - 2+ VIEW COMPARISON:  Chest radiograph dated 01/27/2021. FINDINGS: There is a mildly displaced fracture humeral neck of the left with approximately 4 mm medial displacement of the distal fracture fragment. There is fracture of the distal third of the left humeral diaphysis with displacement of the distal fracture fragment and internal fixation hardware. There is advanced osteopenia. The soft tissues are grossly unremarkable. IMPRESSION: 1. Mildly displaced fracture of the left humeral neck. 2. Displaced fracture of the distal humeral diaphysis with displacement of the internal fixation hardware. Electronically Signed   By: Anner Crete M.D.   On: 01/27/2021 16:20   DG Femur Min 2 Views Right  Result Date: 01/27/2021 CLINICAL DATA:  Distal right femur  pain and deformity post fall EXAM: RIGHT FEMUR 2 VIEWS COMPARISON:  None. FINDINGS: There is a comminuted oblique fracture of the distal femoral metadiaphysis with approximately 1 shaft with of  medial displacement and impaction of the distal fracture fragment. IMPRESSION: Comminuted displaced oblique fracture of the distal femoral metadiaphysis. Electronically Signed   By: Dahlia Bailiff MD   On: 01/27/2021 18:09    Review of Systems Blood pressure (!) 105/40, pulse 66, temperature (!) 97.5 F (36.4 C), temperature source Oral, resp. rate (!) 22, SpO2 96 %. Physical Exam Physical Examination: General appearance - alert, well appearing, and in no distress Mental status - Alert and oriented to person and place Neurological - alert, oriented, normal speech, no focal findings or movement disorder noted Extremities - peripheral pulses normal, no pedal edema, no clubbing or cyanosis Right lower extremity- Shortened and externally rotated; deformity at distal thigh; thigh swelling minimal and compartments are soft; pulses 2 + with intact sensation and      Motor function; no tenderness about hip Left lower extremity and right upper extremity exams normal Left upper extremity- motion at mid humerus but with minimal pain; no significant swelling; pulses and sensation intact; moves fingers and flexes/extends wrist  X-rays- Right oblique distal 1/3 femur fracture Left humerus periprosthetic fracture at tip of humeral component of Total elbow arthroplasty  Assessment/Plan: 1- Right femur fracture- Will require operative fixation. I will discuss this with the Ortho trauma team in the AM. Her pulses are normal and thigh has minimal swelling thus I do not feel that there is any substantial bleed ing occurring that would account for her earlier hypotension. That was most likely multifactorial in nature and not secondary to any blood loss from the fractures 2- Left humerus periprosthetic fracture- Will review  with Dr Amedeo Plenty as he has been caring for her for the upper extremity. Question acuity of that fracture  Gaynelle Arabian 01/27/2021, 11:50 PM

## 2021-01-27 NOTE — H&P (Signed)
History and Physical    [name] [id number] DOB: [birth year] DOA: 01/27/2021  PCP: Prince Solian, MD  Patient coming from: Home  I have personally briefly reviewed patient's old medical records in [place]  Chief Complaint: Fall, knee pain  HPI: [name] is a 87 y.o. female with medical history significant of HTN, prior L elbow replacement with "dislocation of prosthesis" (per cards note).  Pt with some halucinations / confusion earlier today.  Family was concerned about UTI and planning to send urine for analysis.  But then later today had mechanical fall at home.  Was trying to go to the bathroom, fell on floor: Severe pain to R knee, mild pain to L arm.  Unable to bear wt or ambulate.  Symptoms persistent.  No CP, no SOB, no head injury, no LOC, no neck pain.  ED Course: Injuries found: 1) Distal R femur fx, interarticular fx, large joint effusion 2) L humerus head fx 3) L humerus midshaft fx with displacement of prosthesis (suspect may be chronic, or acute on chronic). 4) age indeterminate T2 and T3 compression deformities (no pain, mild, and maybe chronic?)  Pt also dropped BP while in ED, responded to IVF.   Review of Systems: As per HPI, otherwise all review of systems negative.  Past Medical History:  Diagnosis Date  . Hiatal hernia   . Hyperlipidemia   . Hypertension   . Hypothyroidism   . Macular degeneration   . Obesity   . Osteoporosis   . Ovarian cancer ([place])    Stage IA grade 2 ovarian cancer    Past Surgical History:  Procedure Laterality Date  . CHOLECYSTECTOMY, LAPAROSCOPIC    . colectomy  2005    Rectosigmoid colectomy with reanastomosis  . ELBOW ARTHROPLASTY     replacing the humeral stem  . KYPHOPLASTY N/A 03/02/2013   Procedure: KYPHOPLASTY;  Surgeon: Erline Levine, MD;  Location: [facility];  Service: Neurosurgery;  Laterality: N/A;  Lumbar One Kyphoplasty  . OTHER SURGICAL HISTORY    . OTHER SURGICAL HISTORY      Ovarian cancer resection and staging     reports that she quit smoking about 29 years ago. Her smoking use included cigarettes. She has never used smokeless tobacco. She reports previous alcohol use. She reports that she does not use drugs.  No Known Allergies  Family History  Problem Relation Age of Onset  . Heart attack Father      Prior to Admission medications   Medication Sig Start Date End Date Taking? Authorizing Provider  aspirin EC 81 MG tablet Take 81 mg by mouth daily. Swallow whole.   Yes [provider]  cholecalciferol (VITAMIN D3) 25 MCG (1000 UT) tablet Take 1,000 Units by mouth daily.   Yes [provider]  divalproex (DEPAKOTE) 125 MG DR tablet Take 125 mg by mouth 2 (two) times daily.   Yes [provider]  docusate sodium 100 MG CAPS Take 100 mg by mouth 2 (two) times daily as needed for constipation. For constipation Patient taking differently: Take 100 mg by mouth daily. 03/06/13  Yes Rai, Ripudeep K, MD  donepezil (ARICEPT) 10 MG tablet Take 10 mg by mouth at bedtime. 12/30/18  Yes [provider]  ferrous sulfate 325 (65 FE) MG EC tablet Take 325 mg by mouth daily.   Yes [provider]  hydrochlorothiazide (HYDRODIURIL) 25 MG tablet TAKE 1 TABLET BY MOUTH EVERY DAY Patient taking differently: Take 25 mg by  mouth daily. 12/16/20  Yes Martinique, Peter M, MD  irbesartan (AVAPRO) 150 MG tablet Take 1 tablet (150 mg total) by mouth daily. 12/07/19  Yes [place], Peter M, MD  levothyroxine (SYNTHROID) 125 MCG tablet Take 125 mcg by mouth daily before breakfast.   Yes [provider]  loratadine (CLARITIN) 10 MG tablet Take 10 mg by mouth daily.   Yes [provider]  Lutein-Zeaxanthin (OCUVITE LUTEIN 25 PO) Take 25 mg by mouth at bedtime.   Yes [provider]  Multiple Vitamin (MULTIVITAMIN WITH MINERALS) TABS tablet Take 1 tablet by mouth at bedtime.   Yes [provider]  [place] Calcium  500 MG TABS Take 500 mg by mouth at bedtime.   Yes [provider]  pantoprazole (PROTONIX) 40 MG tablet Take 40 mg by mouth daily.   Yes [provider]  rosuvastatin (CRESTOR) 20 MG tablet Take 20 mg by mouth at bedtime.   Yes [provider]  vitamin C (ASCORBIC ACID) 500 MG tablet Take 500 mg by mouth daily.   Yes [provider]    Physical Exam: Vitals:   01/27/21 1945 01/27/21 1948 01/27/21 1950 01/27/21 2130  BP: (!) 82/66 96/74 (!) 121/96 (!) 105/48  Pulse: (!) 55 (!) 57 (!) 57 65  Resp: 19 20 16  (!) 24  Temp:      TempSrc:      SpO2: 97% 97% 95% 94%    Constitutional: NAD, calm, comfortable Eyes: PERRL, lids and conjunctivae normal ENMT: Mucous membranes are moist. Posterior pharynx clear of any exudate or lesions.Normal dentition.  Neck: normal, supple, no masses, no thyromegaly Respiratory: clear to auscultation bilaterally, no wheezing, no crackles. Normal respiratory effort. No accessory muscle use.  Cardiovascular: RRR, has palpable L radial pulse and R DP. Abdomen: no tenderness, no masses palpated. No hepatosplenomegaly. Bowel sounds positive.  Musculoskeletal: R knee TTP, L arm TTP Skin: no rashes, lesions, ulcers. No induration Neurologic: Grossly intact in L hand and R foot Psychiatric: Normal judgment and insight. Alert and oriented x 3. Normal mood.    Labs on Admission: I have personally reviewed following labs and imaging studies  CBC: Recent Labs  Lab 01/27/21 1710  WBC 16.9*  NEUTROABS 14.1*  HGB 11.4*  HCT 37.3  MCV 97.1  PLT 875   Basic Metabolic Panel: Recent Labs  Lab 01/27/21 1710  NA 139  K 4.1  CL 102  CO2 24  GLUCOSE 153*  BUN 20  CREATININE 1.01*  CALCIUM 8.9   GFR: Estimated Creatinine Clearance: 36.7 mL/min (A) (by C-G formula based on SCr of 1.01 mg/dL (H)). Liver Function Tests: Recent Labs  Lab 01/27/21 1710  AST 23  ALT 21  ALKPHOS 50  BILITOT 0.7  PROT 6.1*  ALBUMIN 3.3*    No results for input(s): LIPASE, AMYLASE in the last 168 hours. No results for input(s): AMMONIA in the last 168 hours. Coagulation Profile: Recent Labs  Lab 01/27/21 1710  INR 1.1   Cardiac Enzymes: No results for input(s): CKTOTAL, CKMB, CKMBINDEX, TROPONINI in the last 168 hours. BNP (last 3 results) No results for input(s): PROBNP in the last 8760 hours. HbA1C: No results for input(s): HGBA1C in the last 72 hours. CBG: No results for input(s): GLUCAP in the last 168 hours. Lipid Profile: No results for input(s): CHOL, HDL, LDLCALC, TRIG, CHOLHDL, LDLDIRECT in the last 72 hours. Thyroid Function Tests: No results for input(s): TSH, T4TOTAL, FREET4, T3FREE, THYROIDAB in the last 72 hours. Anemia  Panel: No results for input(s): VITAMINB12, FOLATE, FERRITIN, TIBC, IRON, RETICCTPCT in the last 72 hours. Urine analysis:    Component Value Date/Time   COLORURINE AMBER (A) 01/27/2021 1954   APPEARANCEUR HAZY (A) 01/27/2021 1954   LABSPEC 1.026 01/27/2021 1954   PHURINE 5.0 01/27/2021 1954   GLUCOSEU NEGATIVE 01/27/2021 1954   HGBUR MODERATE (A) 01/27/2021 1954   BILIRUBINUR NEGATIVE 01/27/2021 1954   KETONESUR 5 (A) 01/27/2021 1954   PROTEINUR 30 (A) 01/27/2021 1954   UROBILINOGEN 0.2 09/18/2015 1737   NITRITE NEGATIVE 01/27/2021 1954   LEUKOCYTESUR MODERATE (A) 01/27/2021 1954    Radiological Exams on Admission: CT Head Wo Contrast  Result Date: 01/27/2021 CLINICAL DATA:  Head trauma, minor. Additional history provided: Fall, neck pain. EXAM: CT HEAD WITHOUT CONTRAST TECHNIQUE: Contiguous axial images were obtained from the base of the skull through the vertex without intravenous contrast. COMPARISON:  No pertinent prior exams available for comparison. FINDINGS: Brain: Mild cerebral atrophy. There is no acute intracranial hemorrhage. No demarcated cortical infarct. No extra-axial fluid collection. No evidence of intracranial mass. No midline shift. Vascular: No hyperdense  vessel.  Atherosclerotic calcifications. Skull: Normal. Negative for fracture or focal lesion. Sinuses/Orbits: Visualized orbits show no acute finding. No significant paranasal sinus disease. IMPRESSION: No evidence of acute intracranial abnormality. Mild cerebral atrophy. Electronically Signed   By: Kellie Simmering DO   On: 01/27/2021 17:40   CT Cervical Spine Wo Contrast  Result Date: 01/27/2021 CLINICAL DATA:  Neck trauma.  Fall/neck pain. EXAM: CT CERVICAL SPINE WITHOUT CONTRAST TECHNIQUE: Multidetector CT imaging of the cervical spine was performed without intravenous contrast. Multiplanar CT image reconstructions were also generated. COMPARISON:  Chest radiographs 01/07/2010. FINDINGS: Alignment: Trace C4-C5 grade 1 retrolisthesis. 2 mm C7-T1 grade 1 anterolisthesis. Trace T2-T3 grade 1 anterolisthesis. Skull base and vertebrae: The basion-dental and atlanto-dental intervals are maintained.No evidence of acute fracture to the cervical spine. Mild age-indeterminate T2 superior endplate compression deformity with superimposed Schmorl node. Age-indeterminate T3 vertebral compression deformity (30-40% height loss). Soft tissues and spinal canal: No prevertebral fluid or swelling. No visible canal hematoma. Disc levels: Cervical spondylosis with multilevel disc space narrowing, disc bulges, posterior disc osteophytes, uncovertebral hypertrophy and facet arthrosis. Disc space narrowing is moderate/advanced at C4-C5 and C6-C7. Additionally, small central disc protrusions are present at C2-C3 and C3-C4. Facet joint ankylosis on the left at C5-C6. Facet joint ankylosis is also suspected bilaterally at T3-T4. Upper chest: No consolidation within the imaged lung apices. No visible pneumothorax. IMPRESSION: 1. No evidence of acute fracture to the cervical spine. 2. Age-indeterminate T2 and T3 vertebral compression deformities, as described. 3. Mild C4-C5 grade 1 retrolisthesis. 4. Mild C7-T1 and T2-T3 grade 1  anterolisthesis. 5. Cervical spondylosis, as described. 6. Levels of facet joint ankylosis within the cervical and visualized upper thoracic spine. Electronically Signed   By: Kellie Simmering DO   On: 01/27/2021 17:54   CT Abdomen Pelvis W Contrast  Result Date: 01/27/2021 CLINICAL DATA:  [place] from standing EXAM: CT ABDOMEN AND PELVIS WITH CONTRAST TECHNIQUE: Multidetector CT imaging of the abdomen and pelvis was performed using the standard protocol following bolus administration of intravenous contrast. CONTRAST:  121mL OMNIPAQUE IOHEXOL 300 MG/ML  SOLN COMPARISON:  None. FINDINGS: Lower chest: No acute pleural or parenchymal lung disease. Hepatobiliary: No focal liver abnormality is seen. Status post cholecystectomy. No biliary dilatation. Pancreas: Unremarkable. No pancreatic ductal dilatation or surrounding inflammatory changes. Spleen: Normal in size without focal abnormality. Adrenals/Urinary Tract: Adrenal glands are unremarkable.  Kidneys are normal, without renal calculi, focal lesion, or hydronephrosis. Bladder is unremarkable. Stomach/Bowel: No bowel obstruction or ileus. No bowel wall thickening or inflammatory change. Vascular/Lymphatic: There is ectasia of the thoracoabdominal aorta without aneurysm. Diffuse atherosclerosis. No pathologic adenopathy. Surgical clips along the left pelvic sidewall and retroperitoneum consistent with lymphadenectomy. Reproductive: Status post hysterectomy. No adnexal masses. Other: No free fluid or free gas. Midline supraumbilical ventral hernia contains a portion of the transverse colon, without bowel obstruction or strangulation. There is a small fat containing umbilical hernia, as well as bilateral fat containing inguinal hernias. Musculoskeletal: There is a chronic L1 compression deformity with vertebra plana and evidence of prior vertebral augmentation. Chronic T11 compression deformity also noted, approximately 50% loss of height. No acute displaced fractures.  Reconstructed images demonstrate no additional findings. IMPRESSION: 1. Midline supraumbilical ventral hernia contains a portion of the transverse colon, without bowel obstruction or strangulation. 2. Small fat containing umbilical and bilateral inguinal hernias. 3. Chronic T11 and L1 compression fractures as above. Electronically Signed   By: Randa Ngo M.D.   On: 01/27/2021 20:37   DG Pelvis Portable  Result Date: 01/27/2021 CLINICAL DATA:  [place] EXAM: PORTABLE PELVIS 1-2 VIEWS COMPARISON:  None. FINDINGS: Supine frontal view of the pelvis was obtained, limited by positioning and collimation. The left hemipelvis and left hip are excluded by collimation. I do not see any acute fracture within the visualized portions of the pelvis or right hip. Soft tissues are unremarkable. IMPRESSION: 1. No acute pelvic fracture on this limited evaluation. Electronically Signed   By: Randa Ngo M.D.   On: 01/27/2021 16:22   DG Chest Port 1 View  Result Date: 01/27/2021 CLINICAL DATA:  87 year old female with fall. EXAM: PORTABLE CHEST 1 VIEW COMPARISON:  Chest radiograph dated 06/07/2010. FINDINGS: No focal consolidation, pleural effusion or pneumothorax. Stable cardiac silhouette. Atherosclerotic calcification of the aorta. Osteopenia with degenerative changes of the spine. Old right posterior rib fractures. Left humeral neck fracture better seen on the upper extremity radiograph. Partially visualized left upper extremity hardware. IMPRESSION: 1. No active cardiopulmonary disease. 2. Fracture of the left humeral neck. Electronically Signed   By: Anner Crete M.D.   On: 01/27/2021 16:16   DG Knee Right Port  Result Date: 01/27/2021 CLINICAL DATA:  [place] EXAM: PORTABLE RIGHT KNEE - 1-2 VIEW COMPARISON:  None. FINDINGS: Frontal and lateral views of the right knee are obtained. There is a comminuted oblique fracture of the distal right femur, with approximately 15 mm of ventral displacement of the distal fracture  fragment. The fracture line extends to the trochlear groove of the patellofemoral articulation, consistent with intra-articular fracture. There is a large joint effusion. Diffuse soft tissue edema. IMPRESSION: 1. Intra-articular comminuted fracture of the distal right femur, with 15 mm of ventral displacement of the distal fracture fragment. 2. Large joint effusion. Electronically Signed   By: Randa Ngo M.D.   On: 01/27/2021 16:20   DG Humerus Left  Result Date: 01/27/2021 CLINICAL DATA:  87 year old female with fall. EXAM: LEFT HUMERUS - 2+ VIEW COMPARISON:  Chest radiograph dated 01/27/2021. FINDINGS: There is a mildly displaced fracture humeral neck of the left with approximately 4 mm medial displacement of the distal fracture fragment. There is fracture of the distal third of the left humeral diaphysis with displacement of the distal fracture fragment and internal fixation hardware. There is advanced osteopenia. The soft tissues are grossly unremarkable. IMPRESSION: 1. Mildly displaced fracture of the left humeral neck. 2. Displaced fracture  of the distal humeral diaphysis with displacement of the internal fixation hardware. Electronically Signed   By: Anner Crete M.D.   On: 01/27/2021 16:20   DG Femur Min 2 Views Right  Result Date: 01/27/2021 CLINICAL DATA:  Distal right femur pain and deformity post fall EXAM: RIGHT FEMUR 2 VIEWS COMPARISON:  None. FINDINGS: There is a comminuted oblique fracture of the distal femoral metadiaphysis with approximately 1 shaft with of medial displacement and impaction of the distal fracture fragment. IMPRESSION: Comminuted displaced oblique fracture of the distal femoral metadiaphysis. Electronically Signed   By: Dahlia Bailiff MD   On: 01/27/2021 18:09    EKG: Independently reviewed.  RBBB (chronic), and HR of 60 (chronically has HR around 60, or even S.Brady down to 45 it looks like)  Assessment/Plan Principal Problem:   Closed fracture of right distal  femur (HCC) Active Problems:   HTN (hypertension)   Bradycardia   RBBB   Closed fracture of left humerus   Delirium    1. Closed fx of R distal femur - 1. Hip fx pathway 2. Ortho consulted 1. indicated might not do surgery tomorrow 3. But gonna keep NPO after MN anyhow 4. BP dropping in ED - does seem to be fluid responsive though 1. Thinking she may have hemorrhage into the joint space causing BP drop and large joint effusion 2. IVF: 1.5L bolus and LR at 100 cc/hr 3. Repeat H/H now and CBC in AM 4. Tele monitor 5. No anticoagulation yet 5. Pain control including IV via pathway 2. Closed fracture(s) of left humerus - 1. Fx at humeral head is likely acute 2. Fx at prosthesis: sounds like its acute on chronic vs chronic (not as clear). 3. Arm in sling 4. Neurovascularly intact 5. Management per ortho 3. Hypotension - 1. See above 2. Hold home BP meds 4. UTI - 1. Rocephin 2. Culture pending 5. Delirium on top of mild dementia - 1. Onset earlier today BEFORE falls and fractures 2. Likely due to UTI 3. Extensive h/o delirium with UTIs in past  DVT prophylaxis: SCDs Code Status: Full Family Communication: Son at bedside Disposition Plan: SNF vs CIR after fracture(s) repaired Consults called: Dr. Wynelle Link Admission status: Admit to inpatient  Severity of Illness: The appropriate patient status for this patient is INPATIENT. Inpatient status is judged to be reasonable and necessary in order to provide the required intensity of service to ensure the patient's safety. The patient's presenting symptoms, physical exam findings, and initial radiographic and laboratory data in the context of their chronic comorbidities is felt to place them at high risk for further clinical deterioration. Furthermore, it is not anticipated that the patient will be medically stable for discharge from the hospital within 2 midnights of admission. The following factors support the patient status of  inpatient.   IP status for OR repair of femur fx.   * I certify that at the point of admission it is my clinical judgment that the patient will require inpatient hospital care spanning beyond 2 midnights from the point of admission due to high intensity of service, high risk for further deterioration and high frequency of surveillance required.*    Tamim Skog M. DO [facility]  How to contact the [facility] Attending or Consulting provider [place] or covering provider during after hours [place], for this patient?  1. Check the care team in [facility] and look for a) attending/consulting [facility] provider listed and b) the [facility] team listed 2. Log  into [email or web address]  Amion Physician Scheduling and messaging for groups and whole hospitals  On call and physician scheduling software for group practices, residents, hospitalists and other medical providers for call, clinic, rotation and shift schedules. OnCall Enterprise is a hospital-wide system for scheduling doctors and paging doctors on call. EasyPlot is for scientific plotting and data analysis.  [email or web address]  and use 's universal password to access. If you do not have the password, please contact the hospital operator.  3. Locate the [facility] provider you are looking for under [facility] and page to a number that you can be directly reached. 4. If you still have difficulty reaching the provider, please page the [facility] (Director on Call) for the Hospitalists listed on amion for assistance.  01/27/2021, 9:46 PM

## 2021-01-27 NOTE — ED Triage Notes (Signed)
Pt BIB [facility] from home c/o of a fall. Pt was in the bathroom when she had a fall from a standing position. Pt denies any LOC or hitting her head. Pt c/o of right knee pain, 2/10 currently. Per EMS pt was laying on her left side when they arrived but right leg is rotated inward. Right knee pain is the only pain pt is c/o. Pt received 150 mcg of fentanyl with EMS.

## 2021-01-27 NOTE — Progress Notes (Signed)
Orthopedic Tech Progress Note Patient Details:  [name] October 06, 1931 [id number]  Ortho Devices Type of Ortho Device: Knee Immobilizer Ortho Device/Splint Location: Right Lower Extremity Ortho Device/Splint Interventions: Ordered,Application,Adjustment   Post Interventions Instructions Provided: Adjustment of device,Care of device,Poper ambulation with device   [name] 01/27/2021, 4:57 PM

## 2021-01-27 NOTE — ED Provider Notes (Signed)
[facility] EMERGENCY DEPARTMENT Provider Note   CSN: [id number] Arrival date & time: 01/27/21  1510     History Chief Complaint  Patient presents with  . Fall    [name] is a 87 y.o. female.  The history is provided by the patient, the EMS personnel, medical records and a relative.  Fall   [name] is a 87 y.o. female who presents to the Emergency Department complaining of fall.  Level V caveat due to dementia. She presents to the ED by EMS for evaluation of injuries following a fall.  She states that she was attempting to use the bathroom when she miss-stepped and fell to the floor. She complains of severe pain to the right leg, mild pain to the left arm. She does have a history of prior arm injury. She denies any head injury or loss of consciousness. Her son believes that she is coming down with a urinary tract infection. She does have a history of recurrent infections. He noted today that she began hallucinating and was planning to take a urine sample to the doctor to have it analyzed. She lives at home with her son.      Past Medical History:  Diagnosis Date  . Hiatal hernia   . Hyperlipidemia   . Hypertension   . Hypothyroidism   . Macular degeneration   . Obesity   . Osteoporosis   . Ovarian cancer ([place])    Stage IA grade 2 ovarian cancer    Patient Active Problem List   Diagnosis Date Noted  . Closed fracture of left humerus 01/27/2021  . Delirium 01/27/2021  . Closed fracture of right distal femur ([place]) 01/27/2021  . UTI (urinary tract infection) 03/02/2013  . Fracture of L1 vertebra ([place]) 02/27/2013  . Chronic back pain greater than 3 months duration 02/27/2013  . RBBB 12/09/2012  . HTN (hypertension) 11/09/2011  . Bradycardia 11/09/2011    Past Surgical History:  Procedure Laterality Date  . CHOLECYSTECTOMY, LAPAROSCOPIC    . colectomy  2005    Rectosigmoid colectomy with reanastomosis  . ELBOW ARTHROPLASTY      replacing the humeral stem  . KYPHOPLASTY N/A 03/02/2013   Procedure: KYPHOPLASTY;  Surgeon: Erline Levine, MD;  Location: [facility];  Service: Neurosurgery;  Laterality: N/A;  Lumbar One Kyphoplasty  . OTHER SURGICAL HISTORY    . OTHER SURGICAL HISTORY     Ovarian cancer resection and staging     OB History   No obstetric history on file.     Family History  Problem Relation Age of Onset  . Heart attack Father     Social History   Tobacco Use  . Smoking status: Former Smoker    Types: Cigarettes    Quit date: 1993    Years since quitting: 29.1  . Smokeless tobacco: Never Used  Substance Use Topics  . Alcohol use: Not Currently    Alcohol/week: 0.0 standard drinks  . Drug use: No    Home Medications Prior to Admission medications   Medication Sig Start Date End Date Taking? Authorizing Provider  aspirin EC 81 MG tablet Take 81 mg by mouth daily. Swallow whole.   Yes [provider]  cholecalciferol (VITAMIN D3) 25 MCG (1000 UT) tablet Take 1,000 Units by mouth daily.   Yes [provider]  divalproex (DEPAKOTE) 125 MG DR tablet Take 125 mg by mouth 2 (two) times daily.   Yes [provider]  docusate  sodium 100 MG CAPS Take 100 mg by mouth 2 (two) times daily as needed for constipation. For constipation Patient taking differently: Take 100 mg by mouth daily. 03/06/13  Yes Rai, Ripudeep K, MD  donepezil (ARICEPT) 10 MG tablet Take 10 mg by mouth at bedtime. 12/30/18  Yes [provider]  ferrous sulfate 325 (65 FE) MG EC tablet Take 325 mg by mouth daily.   Yes [provider]  hydrochlorothiazide (HYDRODIURIL) 25 MG tablet TAKE 1 TABLET BY MOUTH EVERY DAY Patient taking differently: Take 25 mg by mouth daily. 12/16/20  Yes Martinique, Peter M, MD  irbesartan (AVAPRO) 150 MG tablet Take 1 tablet (150 mg total) by mouth daily. 12/07/19  Yes Martinique, Peter M, MD  levothyroxine (SYNTHROID) 125 MCG tablet Take 125 mcg by mouth daily  before breakfast.   Yes [provider]  loratadine (CLARITIN) 10 MG tablet Take 10 mg by mouth daily.   Yes [provider]  Lutein-Zeaxanthin (OCUVITE LUTEIN 25 PO) Take 25 mg by mouth at bedtime.   Yes [provider]  Multiple Vitamin (MULTIVITAMIN WITH MINERALS) TABS tablet Take 1 tablet by mouth at bedtime.   Yes [provider]  [place] Calcium 500 MG TABS Take 500 mg by mouth at bedtime.   Yes [provider]  pantoprazole (PROTONIX) 40 MG tablet Take 40 mg by mouth daily.   Yes [provider]  rosuvastatin (CRESTOR) 20 MG tablet Take 20 mg by mouth at bedtime.   Yes [provider]  vitamin C (ASCORBIC ACID) 500 MG tablet Take 500 mg by mouth daily.   Yes [provider]    Allergies    Patient has no known allergies.  Review of Systems   Review of Systems  All other systems reviewed and are negative.   Physical Exam Updated Vital Signs BP (!) 121/96   Pulse (!) 57   Temp (!) 97.5 F (36.4 C) (Oral)   Resp 16   SpO2 95%   Physical Exam Vitals and nursing note reviewed.  Constitutional:      Appearance: She is well-developed and well-nourished.  HENT:     Head: Normocephalic and atraumatic.  Cardiovascular:     Rate and Rhythm: Normal rate and regular rhythm.     Heart sounds: No murmur heard.   Pulmonary:     Effort: Pulmonary effort is normal. No respiratory distress.     Breath sounds: Normal breath sounds.  Abdominal:     Palpations: Abdomen is soft.     Tenderness: There is no abdominal tenderness. There is no guarding or rebound.  Musculoskeletal:        General: No edema.     Comments: 2+ DP pulses bilaterally. There is mild tenderness to palpation over the right hip. There is significant tenderness to palpation over the right knee. The right lower extremity is internally rotated with deformity at the knee. The left upper extremity has moderate tenderness over the mid upper arm  with a upper arm deformity. 2+ radial pulses bilaterally.  Skin:    General: Skin is warm and dry.  Neurological:     Mental Status: She is alert.     Comments: Oriented to place in recent events. Wiggles all digits.  Psychiatric:        Mood and Affect: Mood and affect normal.        Behavior: Behavior normal.     ED Results / Procedures / Treatments   Labs (all labs  ordered are listed, but only abnormal results are displayed) Labs Reviewed  COMPREHENSIVE METABOLIC PANEL - Abnormal; Notable for the following components:      Result Value   Glucose, Bld 153 (*)    Creatinine, Ser 1.01 (*)    Total Protein 6.1 (*)    Albumin 3.3 (*)    GFR, Estimated 54 (*)    All other components within normal limits  CBC WITH DIFFERENTIAL/PLATELET - Abnormal; Notable for the following components:   WBC 16.9 (*)    RBC 3.84 (*)    Hemoglobin 11.4 (*)    Neutro Abs 14.1 (*)    Monocytes Absolute 1.5 (*)    Abs Immature Granulocytes 0.10 (*)    All other components within normal limits  URINALYSIS, ROUTINE W REFLEX MICROSCOPIC - Abnormal; Notable for the following components:   Color, Urine AMBER (*)    APPearance HAZY (*)    Hgb urine dipstick MODERATE (*)    Ketones, ur 5 (*)    Protein, ur 30 (*)    Leukocytes,Ua MODERATE (*)    Bacteria, UA RARE (*)    All other components within normal limits  URINE CULTURE  SARS CORONAVIRUS 2 (TAT 6-24 HRS)  PROTIME-INR  CBC  BASIC METABOLIC PANEL  TYPE AND SCREEN  ABO/RH    EKG EKG Interpretation  Date/Time:  Monday January 27 2021 15:23:25 EST Ventricular Rate:  57 PR Interval:    QRS Duration: 149 QT Interval:  452 QTC Calculation: 441 R Axis:   11 Text Interpretation: Sinus rhythm Short PR interval Right bundle branch block Interpretation limited secondary to artifact Confirmed by Quintella Reichert [phone]) on 01/27/2021 3:38:46 PM   Radiology CT Head Wo Contrast  Result Date: 01/27/2021 CLINICAL DATA:  Head trauma, minor.  Additional history provided: Fall, neck pain. EXAM: CT HEAD WITHOUT CONTRAST TECHNIQUE: Contiguous axial images were obtained from the base of the skull through the vertex without intravenous contrast. COMPARISON:  No pertinent prior exams available for comparison. FINDINGS: Brain: Mild cerebral atrophy. There is no acute intracranial hemorrhage. No demarcated cortical infarct. No extra-axial fluid collection. No evidence of intracranial mass. No midline shift. Vascular: No hyperdense vessel.  Atherosclerotic calcifications. Skull: Normal. Negative for fracture or focal lesion. Sinuses/Orbits: Visualized orbits show no acute finding. No significant paranasal sinus disease. IMPRESSION: No evidence of acute intracranial abnormality. Mild cerebral atrophy. Electronically Signed   By: Kellie Simmering DO   On: 01/27/2021 17:40   CT Cervical Spine Wo Contrast  Result Date: 01/27/2021 CLINICAL DATA:  Neck trauma.  Fall/neck pain. EXAM: CT CERVICAL SPINE WITHOUT CONTRAST TECHNIQUE: Multidetector CT imaging of the cervical spine was performed without intravenous contrast. Multiplanar CT image reconstructions were also generated. COMPARISON:  Chest radiographs 01/07/2010. FINDINGS: Alignment: Trace C4-C5 grade 1 retrolisthesis. 2 mm C7-T1 grade 1 anterolisthesis. Trace T2-T3 grade 1 anterolisthesis. Skull base and vertebrae: The basion-dental and atlanto-dental intervals are maintained.No evidence of acute fracture to the cervical spine. Mild age-indeterminate T2 superior endplate compression deformity with superimposed Schmorl node. Age-indeterminate T3 vertebral compression deformity (30-40% height loss). Soft tissues and spinal canal: No prevertebral fluid or swelling. No visible canal hematoma. Disc levels: Cervical spondylosis with multilevel disc space narrowing, disc bulges, posterior disc osteophytes, uncovertebral hypertrophy and facet arthrosis. Disc space narrowing is moderate/advanced at C4-C5 and C6-C7.  Additionally, small central disc protrusions are present at C2-C3 and C3-C4. Facet joint ankylosis on the left at C5-C6. Facet joint ankylosis is also suspected bilaterally at T3-T4. Upper chest:  No consolidation within the imaged lung apices. No visible pneumothorax. IMPRESSION: 1. No evidence of acute fracture to the cervical spine. 2. Age-indeterminate T2 and T3 vertebral compression deformities, as described. 3. Mild C4-C5 grade 1 retrolisthesis. 4. Mild C7-T1 and T2-T3 grade 1 anterolisthesis. 5. Cervical spondylosis, as described. 6. Levels of facet joint ankylosis within the cervical and visualized upper thoracic spine. Electronically Signed   By: Kellie Simmering DO   On: 01/27/2021 17:54   DG Pelvis Portable  Result Date: 01/27/2021 CLINICAL DATA:  [place] EXAM: PORTABLE PELVIS 1-2 VIEWS COMPARISON:  None. FINDINGS: Supine frontal view of the pelvis was obtained, limited by positioning and collimation. The left hemipelvis and left hip are excluded by collimation. I do not see any acute fracture within the visualized portions of the pelvis or right hip. Soft tissues are unremarkable. IMPRESSION: 1. No acute pelvic fracture on this limited evaluation. Electronically Signed   By: Randa Ngo M.D.   On: 01/27/2021 16:22   DG Chest Port 1 View  Result Date: 01/27/2021 CLINICAL DATA:  87 year old female with fall. EXAM: PORTABLE CHEST 1 VIEW COMPARISON:  Chest radiograph dated 06/07/2010. FINDINGS: No focal consolidation, pleural effusion or pneumothorax. Stable cardiac silhouette. Atherosclerotic calcification of the aorta. Osteopenia with degenerative changes of the spine. Old right posterior rib fractures. Left humeral neck fracture better seen on the upper extremity radiograph. Partially visualized left upper extremity hardware. IMPRESSION: 1. No active cardiopulmonary disease. 2. Fracture of the left humeral neck. Electronically Signed   By: Anner Crete M.D.   On: 01/27/2021 16:16   DG Knee Right  Port  Result Date: 01/27/2021 CLINICAL DATA:  [place] EXAM: PORTABLE RIGHT KNEE - 1-2 VIEW COMPARISON:  None. FINDINGS: Frontal and lateral views of the right knee are obtained. There is a comminuted oblique fracture of the distal right femur, with approximately 15 mm of ventral displacement of the distal fracture fragment. The fracture line extends to the trochlear groove of the patellofemoral articulation, consistent with intra-articular fracture. There is a large joint effusion. Diffuse soft tissue edema. IMPRESSION: 1. Intra-articular comminuted fracture of the distal right femur, with 15 mm of ventral displacement of the distal fracture fragment. 2. Large joint effusion. Electronically Signed   By: Randa Ngo M.D.   On: 01/27/2021 16:20   DG Humerus Left  Result Date: 01/27/2021 CLINICAL DATA:  87 year old female with fall. EXAM: LEFT HUMERUS - 2+ VIEW COMPARISON:  Chest radiograph dated 01/27/2021. FINDINGS: There is a mildly displaced fracture humeral neck of the left with approximately 4 mm medial displacement of the distal fracture fragment. There is fracture of the distal third of the left humeral diaphysis with displacement of the distal fracture fragment and internal fixation hardware. There is advanced osteopenia. The soft tissues are grossly unremarkable. IMPRESSION: 1. Mildly displaced fracture of the left humeral neck. 2. Displaced fracture of the distal humeral diaphysis with displacement of the internal fixation hardware. Electronically Signed   By: Anner Crete M.D.   On: 01/27/2021 16:20   DG Femur Min 2 Views Right  Result Date: 01/27/2021 CLINICAL DATA:  Distal right femur pain and deformity post fall EXAM: RIGHT FEMUR 2 VIEWS COMPARISON:  None. FINDINGS: There is a comminuted oblique fracture of the distal femoral metadiaphysis with approximately 1 shaft with of medial displacement and impaction of the distal fracture fragment. IMPRESSION: Comminuted displaced oblique fracture of  the distal femoral metadiaphysis. Electronically Signed   By: Dahlia Bailiff MD   On: 01/27/2021 18:09  Procedures Procedures  CRITICAL CARE Performed by: Quintella Reichert   Total critical care time: 40 minutes  Critical care time was exclusive of separately billable procedures and treating other patients.  Critical care was necessary to treat or prevent imminent or life-threatening deterioration.  Critical care was time spent personally by me on the following activities: development of treatment plan with patient and/or surrogate as well as nursing, discussions with consultants, evaluation of patient's response to treatment, examination of patient, obtaining history from patient or surrogate, ordering and performing treatments and interventions, ordering and review of laboratory studies, ordering and review of radiographic studies, pulse oximetry and re-evaluation of patient's condition.  Medications Ordered in ED Medications  divalproex (DEPAKOTE) DR tablet 125 mg (has no administration in time range)  docusate sodium (COLACE) capsule 100 mg (has no administration in time range)  donepezil (ARICEPT) tablet 10 mg (has no administration in time range)  ferrous sulfate tablet 325 mg (has no administration in time range)  levothyroxine (SYNTHROID) tablet 125 mcg (has no administration in time range)  rosuvastatin (CRESTOR) tablet 20 mg (has no administration in time range)  pantoprazole (PROTONIX) EC tablet 40 mg (has no administration in time range)  multivitamin with minerals tablet 1 tablet (has no administration in time range)  multivitamin (PROSIGHT) tablet 1 tablet (has no administration in time range)  [place] calcium tablet 1,250 mg (has no administration in time range)  ascorbic acid (VITAMIN C) tablet 500 mg (has no administration in time range)  cholecalciferol (VITAMIN D3) tablet 1,000 Units (has no administration in time range)  loratadine (CLARITIN) tablet 10 mg (has no  administration in time range)  HYDROcodone-acetaminophen (NORCO/VICODIN) 5-325 MG per tablet 1-2 tablet (has no administration in time range)  morphine 2 MG/ML injection 0.5 mg (has no administration in time range)  lactated ringers infusion (has no administration in time range)  fentaNYL (SUBLIMAZE) injection 50 mcg (50 mcg Intravenous Given 01/27/21 1554)  fentaNYL (SUBLIMAZE) injection 50 mcg (50 mcg Intravenous Given 01/27/21 1821)  sodium chloride 0.9 % bolus 500 mL (500 mLs Intravenous New Bag/Given 01/27/21 1830)  lactated ringers bolus 500 mL (500 mLs Intravenous New Bag/Given 01/27/21 1946)  iohexol (OMNIPAQUE) 300 MG/ML solution 100 mL (100 mLs Intravenous Contrast Given 01/27/21 2016)    ED Course  I have reviewed the triage vital signs and the nursing notes.  Pertinent labs & imaging results that were available during my care of the patient were reviewed by me and considered in my medical decision making (see chart for details).  Clinical Course as of 01/27/21 2035  Mon [name] 27, 2021  1952 RBC(!): 3.84 [KC]    Clinical Course User Index [KC] Corless, Heloise Ochoa   MDM Rules/Calculators/A&P                         patient presents the emergency department for evaluation of injuries following a fall. She is a deformity to the left upper arm, right leg. Initial difficulty in obtaining imaging due to difficulty with patient positioning due to pain. Initial images significant for a mid shaft humerus fracture, distal femur fracture with displacement. She was placed in knee immobilizer for comfort and stabilization as well as an arm sling. Discussed the findings with Dr. Wynelle Link with orthopedics, he will see the patient and consult. Hospitalist consulted for admission for ongoing treatment.   After call for admission called to bedside for hypotension. Pressure 84/38. Patient mentating well, bradycardic at 55. She  was treated with an IV fluid bolus with rapid correction of her blood  pressure. Re-evaluation of her orthopedic injuries does not demonstrate expanding hematoma. Given her fall will obtain a CT abdomen pelvis to rule out retroperitoneal hematoma. Bedside fast is negative for free fluid. Discussed with trauma surgeon, who evaluated the patient as well. Unclear source of hypotension, may be secondary to medications versus UTI versus vagal episode versus acute blood loss.  Final Clinical Impression(s) / ED Diagnoses Final diagnoses:  Fall  Fall, initial encounter  Closed displaced transverse fracture of shaft of left humerus, initial encounter  Closed displaced comminuted fracture of shaft of right femur, initial encounter [facility])    Rx / [place] Orders ED Discharge Orders    None       Quintella Reichert, MD 01/27/21 2258

## 2021-01-28 ENCOUNTER — Inpatient Hospital Stay (HOSPITAL_COMMUNITY): Payer: Medicare Other

## 2021-01-28 ENCOUNTER — Inpatient Hospital Stay (HOSPITAL_COMMUNITY): Payer: Medicare Other | Admitting: Anesthesiology

## 2021-01-28 ENCOUNTER — Encounter (HOSPITAL_COMMUNITY)
Admission: EM | Disposition: A | Discharge status: Skilled Nursing Facility | Payer: Self-pay | Source: Home / Self Care | Attending: Family Medicine

## 2021-01-28 ENCOUNTER — Encounter (HOSPITAL_COMMUNITY): Payer: Self-pay | Admitting: Internal Medicine

## 2021-01-28 DIAGNOSIS — S72401A Unspecified fracture of lower end of right femur, initial encounter for closed fracture: Secondary | ICD-10-CM | POA: Diagnosis not present

## 2021-01-28 HISTORY — PX: ORIF FEMUR FRACTURE: SHX2119

## 2021-01-28 LAB — CBC
HCT: 27.9 % — ABNORMAL LOW (ref 36.0–46.0)
Hemoglobin: 8.8 g/dL — ABNORMAL LOW (ref 12.0–15.0)
MCH: 30.4 pg (ref 26.0–34.0)
MCHC: 31.5 g/dL (ref 30.0–36.0)
MCV: 96.5 fL (ref 80.0–100.0)
Platelets: 157 10*3/uL (ref 150–400)
RBC: 2.89 MIL/uL — ABNORMAL LOW (ref 3.87–5.11)
RDW: 13.6 % (ref 11.5–15.5)
WBC: 7.8 10*3/uL (ref 4.0–10.5)
nRBC: 0 % (ref 0.0–0.2)

## 2021-01-28 LAB — BASIC METABOLIC PANEL
Anion gap: 10 (ref 5–15)
BUN: 20 mg/dL (ref 8–23)
CO2: 25 mmol/L (ref 22–32)
Calcium: 8.3 mg/dL — ABNORMAL LOW (ref 8.9–10.3)
Chloride: 103 mmol/L (ref 98–111)
Creatinine, Ser: 1.02 mg/dL — ABNORMAL HIGH (ref 0.44–1.00)
GFR, Estimated: 53 mL/min — ABNORMAL LOW (ref >60)
Glucose, Bld: 112 mg/dL — ABNORMAL HIGH (ref 70–99)
Potassium: 4 mmol/L (ref 3.5–5.1)
Sodium: 138 mmol/L (ref 135–145)

## 2021-01-28 LAB — SARS CORONAVIRUS 2 (TAT 6-24 HRS): SARS Coronavirus 2: NEGATIVE

## 2021-01-28 SURGERY — OPEN REDUCTION INTERNAL FIXATION (ORIF) DISTAL FEMUR FRACTURE
Anesthesia: General | Site: Leg Upper | Laterality: Right

## 2021-01-28 MED ORDER — ONDANSETRON HCL 4 MG/2ML IJ SOLN: 4.0000 mg | Freq: Four times a day (QID) | INTRAMUSCULAR | Status: DC | PRN

## 2021-01-28 MED ORDER — 0.9 % SODIUM CHLORIDE (POUR BTL) OPTIME
TOPICAL | Status: DC | PRN
  Administered 2021-01-28: 1000 mL

## 2021-01-28 MED ORDER — ROCURONIUM BROMIDE 10 MG/ML (PF) SYRINGE
PREFILLED_SYRINGE | INTRAVENOUS | Status: AC
  Filled 2021-01-28: qty 10

## 2021-01-28 MED ORDER — DEXAMETHASONE SODIUM PHOSPHATE 10 MG/ML IJ SOLN
INTRAMUSCULAR | Status: AC
  Filled 2021-01-28: qty 1

## 2021-01-28 MED ORDER — CEFAZOLIN SODIUM-DEXTROSE 2-3 GM-%(50ML) IV SOLR
INTRAVENOUS | Status: DC | PRN
  Administered 2021-01-28: 2 g via INTRAVENOUS

## 2021-01-28 MED ORDER — DOCUSATE SODIUM 100 MG PO CAPS
100.0000 mg | ORAL_CAPSULE | Freq: Two times a day (BID) | ORAL | Status: DC
  Administered 2021-01-28 – 2021-01-30 (×3): 100 mg via ORAL
  Filled 2021-01-28 (×4): qty 1

## 2021-01-28 MED ORDER — EPHEDRINE SULFATE 50 MG/ML IJ SOLN
INTRAMUSCULAR | Status: DC | PRN
  Administered 2021-01-28 (×6): 10 mg via INTRAVENOUS

## 2021-01-28 MED ORDER — ONDANSETRON HCL 4 MG/2ML IJ SOLN
INTRAMUSCULAR | Status: DC | PRN
  Administered 2021-01-28: 4 mg via INTRAVENOUS

## 2021-01-28 MED ORDER — CHLORHEXIDINE GLUCONATE 0.12 % MT SOLN
OROMUCOSAL | Status: AC
  Administered 2021-01-28: 15 mL via OROMUCOSAL
  Filled 2021-01-28: qty 15

## 2021-01-28 MED ORDER — VITAMIN D 25 MCG (1000 UNIT) PO TABS
2000.0000 [IU] | ORAL_TABLET | Freq: Two times a day (BID) | ORAL | Status: DC
  Administered 2021-01-28 – 2021-02-02 (×10): 2000 [IU] via ORAL
  Filled 2021-01-28 (×10): qty 2

## 2021-01-28 MED ORDER — ENOXAPARIN SODIUM 40 MG/0.4ML ~~LOC~~ SOLN: 40.0000 mg | SUBCUTANEOUS | Status: DC

## 2021-01-28 MED ORDER — ACETAMINOPHEN 325 MG PO TABS
650.0000 mg | ORAL_TABLET | Freq: Three times a day (TID) | ORAL | Status: DC
  Administered 2021-01-28 – 2021-01-30 (×5): 650 mg via ORAL
  Filled 2021-01-28 (×5): qty 2

## 2021-01-28 MED ORDER — ALBUMIN HUMAN 5 % IV SOLN: INTRAVENOUS | Status: DC | PRN

## 2021-01-28 MED ORDER — CEFAZOLIN SODIUM-DEXTROSE 2-4 GM/100ML-% IV SOLN
INTRAVENOUS | Status: AC
  Filled 2021-01-28: qty 100

## 2021-01-28 MED ORDER — EPHEDRINE 5 MG/ML INJ
INTRAVENOUS | Status: AC
  Filled 2021-01-28: qty 20

## 2021-01-28 MED ORDER — POVIDONE-IODINE 10 % EX SWAB
2.0000 "application " | Freq: Once | CUTANEOUS | Status: AC
  Administered 2021-01-28: 2 via TOPICAL

## 2021-01-28 MED ORDER — CHLORHEXIDINE GLUCONATE 0.12 % MT SOLN
15.0000 mL | Freq: Once | OROMUCOSAL | Status: AC
  Filled 2021-01-28: qty 15

## 2021-01-28 MED ORDER — DEXAMETHASONE SODIUM PHOSPHATE 10 MG/ML IJ SOLN
INTRAMUSCULAR | Status: DC | PRN
  Administered 2021-01-28: 4 mg via INTRAVENOUS

## 2021-01-28 MED ORDER — OXYCODONE HCL 5 MG PO TABS: 5.0000 mg | ORAL_TABLET | Freq: Once | ORAL | Status: DC | PRN

## 2021-01-28 MED ORDER — ONDANSETRON HCL 4 MG PO TABS: 4.0000 mg | ORAL_TABLET | Freq: Four times a day (QID) | ORAL | Status: DC | PRN

## 2021-01-28 MED ORDER — FENTANYL CITRATE (PF) 250 MCG/5ML IJ SOLN
INTRAMUSCULAR | Status: AC
  Filled 2021-01-28: qty 5

## 2021-01-28 MED ORDER — PROPOFOL 10 MG/ML IV BOLUS
INTRAVENOUS | Status: DC | PRN
  Administered 2021-01-28: 110 mg via INTRAVENOUS

## 2021-01-28 MED ORDER — CHLORHEXIDINE GLUCONATE 4 % EX LIQD: 60.0000 mL | Freq: Once | CUTANEOUS | Status: DC

## 2021-01-28 MED ORDER — METOCLOPRAMIDE HCL 5 MG PO TABS: 5.0000 mg | ORAL_TABLET | Freq: Three times a day (TID) | ORAL | Status: DC | PRN

## 2021-01-28 MED ORDER — LIDOCAINE 2% (20 MG/ML) 5 ML SYRINGE
INTRAMUSCULAR | Status: AC
  Filled 2021-01-28: qty 5

## 2021-01-28 MED ORDER — PROPOFOL 10 MG/ML IV BOLUS
INTRAVENOUS | Status: AC
  Filled 2021-01-28: qty 20

## 2021-01-28 MED ORDER — ONDANSETRON HCL 4 MG/2ML IJ SOLN
INTRAMUSCULAR | Status: AC
  Filled 2021-01-28: qty 2

## 2021-01-28 MED ORDER — ENSURE ENLIVE PO LIQD
237.0000 mL | Freq: Two times a day (BID) | ORAL | Status: DC
  Administered 2021-01-30 – 2021-02-02 (×7): 237 mL via ORAL

## 2021-01-28 MED ORDER — PHENYLEPHRINE HCL-NACL 10-0.9 MG/250ML-% IV SOLN
INTRAVENOUS | Status: DC | PRN
  Administered 2021-01-28: 25 ug/min via INTRAVENOUS

## 2021-01-28 MED ORDER — CEFAZOLIN SODIUM-DEXTROSE 2-4 GM/100ML-% IV SOLN: 2.0000 g | INTRAVENOUS | Status: DC

## 2021-01-28 MED ORDER — FENTANYL CITRATE (PF) 100 MCG/2ML IJ SOLN: 25.0000 ug | INTRAMUSCULAR | Status: DC | PRN

## 2021-01-28 MED ORDER — HYDROCODONE-ACETAMINOPHEN 5-325 MG PO TABS
1.0000 | ORAL_TABLET | Freq: Four times a day (QID) | ORAL | Status: DC | PRN
  Administered 2021-01-29: 1 via ORAL
  Filled 2021-01-28: qty 1

## 2021-01-28 MED ORDER — ACETAMINOPHEN 325 MG PO TABS: 325.0000 mg | ORAL_TABLET | Freq: Four times a day (QID) | ORAL | Status: DC | PRN

## 2021-01-28 MED ORDER — CEFAZOLIN SODIUM-DEXTROSE 1-4 GM/50ML-% IV SOLN
1.0000 g | Freq: Four times a day (QID) | INTRAVENOUS | Status: AC
  Administered 2021-01-28 – 2021-01-29 (×2): 1 g via INTRAVENOUS
  Filled 2021-01-28 (×2): qty 50

## 2021-01-28 MED ORDER — LIDOCAINE 2% (20 MG/ML) 5 ML SYRINGE
INTRAMUSCULAR | Status: DC | PRN
  Administered 2021-01-28: 40 mg via INTRAVENOUS

## 2021-01-28 MED ORDER — PHENYLEPHRINE 40 MCG/ML (10ML) SYRINGE FOR IV PUSH (FOR BLOOD PRESSURE SUPPORT)
PREFILLED_SYRINGE | INTRAVENOUS | Status: AC
  Filled 2021-01-28: qty 10

## 2021-01-28 MED ORDER — PHENYLEPHRINE 40 MCG/ML (10ML) SYRINGE FOR IV PUSH (FOR BLOOD PRESSURE SUPPORT)
PREFILLED_SYRINGE | INTRAVENOUS | Status: DC | PRN
  Administered 2021-01-28: 160 ug via INTRAVENOUS

## 2021-01-28 MED ORDER — FENTANYL CITRATE (PF) 250 MCG/5ML IJ SOLN
INTRAMUSCULAR | Status: DC | PRN
  Administered 2021-01-28 (×2): 50 ug via INTRAVENOUS

## 2021-01-28 MED ORDER — METOCLOPRAMIDE HCL 5 MG/ML IJ SOLN: 5.0000 mg | Freq: Three times a day (TID) | INTRAMUSCULAR | Status: DC | PRN

## 2021-01-28 MED ORDER — ROCURONIUM BROMIDE 10 MG/ML (PF) SYRINGE
PREFILLED_SYRINGE | INTRAVENOUS | Status: DC | PRN
  Administered 2021-01-28 (×2): 50 mg via INTRAVENOUS

## 2021-01-28 MED ORDER — OXYCODONE HCL 5 MG/5ML PO SOLN: 5.0000 mg | Freq: Once | ORAL | Status: DC | PRN

## 2021-01-28 MED ORDER — SUGAMMADEX SODIUM 200 MG/2ML IV SOLN
INTRAVENOUS | Status: DC | PRN
  Administered 2021-01-28: 180 mg via INTRAVENOUS

## 2021-01-28 MED ORDER — ONDANSETRON HCL 4 MG/2ML IJ SOLN: 4.0000 mg | Freq: Once | INTRAMUSCULAR | Status: DC | PRN

## 2021-01-28 SURGICAL SUPPLY — 77 items
BIT DRILL 4.0X165 AO STYLE (BIT) ×2 IMPLANT
BIT DRILL CALIBRATED AO 5.5 (DRILL) ×1 IMPLANT
BLADE CLIPPER SURG (BLADE) IMPLANT
BNDG ELASTIC 4X5.8 VLCR STR LF (GAUZE/BANDAGES/DRESSINGS) ×2 IMPLANT
BNDG ELASTIC 6X5.8 VLCR STR LF (GAUZE/BANDAGES/DRESSINGS) ×2 IMPLANT
BNDG GAUZE ELAST 4 BULKY (GAUZE/BANDAGES/DRESSINGS) IMPLANT
BRUSH SCRUB EZ PLAIN DRY (MISCELLANEOUS) ×4 IMPLANT
CANISTER SUCT 3000ML PPV (MISCELLANEOUS) ×2 IMPLANT
COVER SURGICAL LIGHT HANDLE (MISCELLANEOUS) ×2 IMPLANT
COVER WAND RF STERILE (DRAPES) ×2 IMPLANT
DRAPE C-ARM 42X72 X-RAY (DRAPES) ×2 IMPLANT
DRAPE C-ARMOR (DRAPES) ×2 IMPLANT
DRAPE IMP U-DRAPE 54X76 (DRAPES) ×2 IMPLANT
DRAPE ORTHO SPLIT 77X108 STRL (DRAPES) ×4
DRAPE SURG ORHT 6 SPLT 77X108 (DRAPES) ×2 IMPLANT
DRAPE U-SHAPE 47X51 STRL (DRAPES) IMPLANT
DRILL CALIBRATED AO 5.5 (DRILL) ×2
DRSG ADAPTIC 3X8 NADH LF (GAUZE/BANDAGES/DRESSINGS) ×2 IMPLANT
DRSG MEPILEX BORDER 4X4 (GAUZE/BANDAGES/DRESSINGS) ×6 IMPLANT
DRSG PAD ABDOMINAL 8X10 ST (GAUZE/BANDAGES/DRESSINGS) IMPLANT
ELECT REM PT RETURN 9FT ADLT (ELECTROSURGICAL) ×2
ELECTRODE REM PT RTRN 9FT ADLT (ELECTROSURGICAL) ×1 IMPLANT
EVACUATOR 1/8 PVC DRAIN (DRAIN) IMPLANT
EVACUATOR 3/16  PVC DRAIN (DRAIN)
EVACUATOR 3/16 PVC DRAIN (DRAIN) IMPLANT
GAUZE SPONGE 4X4 12PLY STRL (GAUZE/BANDAGES/DRESSINGS) IMPLANT
GAUZE SPONGE 4X4 12PLY STRL LF (GAUZE/BANDAGES/DRESSINGS) ×2 IMPLANT
GLOVE BIO SURGEON STRL SZ7.5 (GLOVE) ×4 IMPLANT
GLOVE BIO SURGEON STRL SZ8 (GLOVE) ×2 IMPLANT
GLOVE BIOGEL PI IND STRL 7.5 (GLOVE) ×1 IMPLANT
GLOVE BIOGEL PI INDICATOR 7.5 (GLOVE) ×1
GLOVE SRG 8 PF TXTR STRL LF DI (GLOVE) ×1 IMPLANT
GLOVE SURG UNDER POLY LF SZ8 (GLOVE) ×2
GOWN STRL REUS W/ TWL LRG LVL3 (GOWN DISPOSABLE) ×4 IMPLANT
GOWN STRL REUS W/ TWL XL LVL3 (GOWN DISPOSABLE) ×1 IMPLANT
GOWN STRL REUS W/TWL LRG LVL3 (GOWN DISPOSABLE) ×8
GOWN STRL REUS W/TWL XL LVL3 (GOWN DISPOSABLE) ×2
GUIDEWIRE BALL NOSE 3.0X900 (WIRE) ×2
GUIDEWIRE ORTH 900X3XBALL NOSE (WIRE) ×1 IMPLANT
KIT BASIN OR (CUSTOM PROCEDURE TRAY) ×2 IMPLANT
KIT TURNOVER KIT B (KITS) ×2 IMPLANT
NAIL FEM RETROGRADE 11X38 (Nail) ×2 IMPLANT
NEEDLE 22X1 1/2 (OR ONLY) (NEEDLE) IMPLANT
NS IRRIG 1000ML POUR BTL (IV SOLUTION) ×2 IMPLANT
PACK TOTAL JOINT (CUSTOM PROCEDURE TRAY) ×2 IMPLANT
PACK UNIVERSAL I (CUSTOM PROCEDURE TRAY) ×2 IMPLANT
PAD ABD 7.5X8 STRL (GAUZE/BANDAGES/DRESSINGS) ×4 IMPLANT
PAD ARMBOARD 7.5X6 YLW CONV (MISCELLANEOUS) ×4 IMPLANT
PAD CAST 4YDX4 CTTN HI CHSV (CAST SUPPLIES) IMPLANT
PADDING CAST ABS 4INX4YD NS (CAST SUPPLIES) ×1
PADDING CAST ABS 6INX4YD NS (CAST SUPPLIES) ×1
PADDING CAST ABS COTTON 4X4 ST (CAST SUPPLIES) ×1 IMPLANT
PADDING CAST ABS COTTON 6X4 NS (CAST SUPPLIES) ×1 IMPLANT
PADDING CAST COTTON 4X4 STRL (CAST SUPPLIES)
PADDING CAST COTTON 6X4 STRL (CAST SUPPLIES) IMPLANT
PIN GUIDE THRD AR 3.2X330 (PIN) ×2 IMPLANT
SCREW CANC FT 6.5X70 (Screw) ×2 IMPLANT
SCREW CANC FT 6.5X80 (Screw) ×4 IMPLANT
SCREW LOCK CORT 5.0X30 (Screw) ×2 IMPLANT
SCREW LOCK CORT 5X36 (Screw) ×2 IMPLANT
SPONGE LAP 18X18 RF (DISPOSABLE) ×2 IMPLANT
STAPLER VISISTAT 35W (STAPLE) ×2 IMPLANT
SUCTION FRAZIER HANDLE 10FR (MISCELLANEOUS) ×2
SUCTION TUBE FRAZIER 10FR DISP (MISCELLANEOUS) ×1 IMPLANT
SUT ETHILON 2 0 FS 18 (SUTURE) ×2 IMPLANT
SUT PROLENE 0 CT 2 (SUTURE) IMPLANT
SUT VIC AB 0 CT1 27 (SUTURE) ×4
SUT VIC AB 0 CT1 27XBRD ANBCTR (SUTURE) ×2 IMPLANT
SUT VIC AB 1 CT1 27 (SUTURE) ×6
SUT VIC AB 1 CT1 27XBRD ANBCTR (SUTURE) ×3 IMPLANT
SUT VIC AB 2-0 CT1 27 (SUTURE) ×6
SUT VIC AB 2-0 CT1 TAPERPNT 27 (SUTURE) ×3 IMPLANT
SYR 20ML ECCENTRIC (SYRINGE) IMPLANT
TOWEL GREEN STERILE (TOWEL DISPOSABLE) ×4 IMPLANT
TOWEL GREEN STERILE FF (TOWEL DISPOSABLE) ×2 IMPLANT
TRAY FOLEY MTR SLVR 16FR STAT (SET/KITS/TRAYS/PACK) IMPLANT
WATER STERILE IRR 1000ML POUR (IV SOLUTION) ×4 IMPLANT

## 2021-01-28 NOTE — Progress Notes (Signed)
Patient ID: [name], female   DOB: 11-03-1931, 87 y.o.   MRN: [id number] My partner Dr. Larey Dresser alerted me to [name]'s admission status post fall with distal femur fracture.  The patient is well-known in my office.  She had a history of a severe elbow injury many years ago which prompted surgical reconstruction with total elbow arthroplasty.  Her total elbow arthroplasty ultimately loosened and she underwent a revision arthroplasty in the very distant past.  Subsequent to this she developed a periprosthetic facture years later.  She most recently was examined in 2020 by myself.  In the fall 2020 the prosthetic fracture was stable and she had chosen nonsurgical management.  The x-rays of her left humerus are identical from August 2020 compared to her current x-rays last night.  She and myself have discussed issues of periprosthetic fractures and surgical reconstruction and she is also sought out a second opinion at [place] by Dr. Rod Mae.  The patient has chosen to live with the arm as is and has done fairly well with this for quite some time.  Thus, the fracture above her humeral stem is an old fracture which she is chosen to live with as opposed to undergoing a major surgical reconstruction.  She has lived with this for quite some time.  I do not see any new findings regarding the patient's radiographs in terms of any distinct change.  Roseanne Kaufman MD

## 2021-01-28 NOTE — Anesthesia Procedure Notes (Signed)
Procedure Name: Intubation Date/Time: 01/28/2021 11:34 AM Performed by: Bryson Corona, CRNA Pre-anesthesia Checklist: Patient identified, Emergency Drugs available, Suction available and Patient being monitored Patient Re-evaluated:Patient Re-evaluated prior to induction Oxygen Delivery Method: Circle System Utilized Preoxygenation: Pre-oxygenation with 100% oxygen Induction Type: IV induction Ventilation: Mask ventilation without difficulty Laryngoscope Size: Mac and 3 Grade View: Grade I Tube type: Oral Tube size: 7.0 mm Number of attempts: 1 Airway Equipment and Method: Stylet and Oral airway Placement Confirmation: ETT inserted through vocal cords under direct vision,  positive ETCO2 and breath sounds checked- equal and bilateral Secured at: 21 cm Tube secured with: Tape Dental Injury: Teeth and Oropharynx as per pre-operative assessment

## 2021-01-28 NOTE — Plan of Care (Signed)

## 2021-01-28 NOTE — Anesthesia Preprocedure Evaluation (Signed)
Anesthesia Evaluation  Patient identified by MRN, date of birth, ID band Patient awake    Reviewed: Allergy & Precautions, NPO status , Patient's Chart, lab work & pertinent test results  Airway Mallampati: II  TM Distance: >3 FB Neck ROM: Full    Dental  (+) Teeth Intact   Pulmonary former smoker,    breath sounds clear to auscultation       Cardiovascular hypertension,  Rhythm:Regular Rate:Normal     Neuro/Psych    GI/Hepatic   Endo/Other    Renal/GU      Musculoskeletal   Abdominal   Peds  Hematology   Anesthesia Other Findings   Reproductive/Obstetrics                             Anesthesia Physical Anesthesia Plan  ASA: III  Anesthesia Plan: General   Post-op Pain Management:    Induction: Intravenous  PONV Risk Score and Plan: Ondansetron and Dexamethasone  Airway Management Planned: Oral ETT  Additional Equipment:   Intra-op Plan:   Post-operative Plan: Extubation in OR  Informed Consent: I have reviewed the patients History and Physical, chart, labs and discussed the procedure including the risks, benefits and alternatives for the proposed anesthesia with the patient or authorized representative who has indicated his/her understanding and acceptance.     Dental advisory given  Plan Discussed with: CRNA and Anesthesiologist  Anesthesia Plan Comments:         Anesthesia Quick Evaluation

## 2021-01-28 NOTE — Anesthesia Postprocedure Evaluation (Signed)
Anesthesia Post Note  Patient: [name]  Procedure(s) Performed: OPEN REDUCTION INTERNAL FIXATION (ORIF) DISTAL FEMUR FRACTURE (Right Leg Upper)     Patient location during evaluation: PACU Anesthesia Type: General Level of consciousness: awake and alert Pain management: pain level controlled Vital Signs Assessment: post-procedure vital signs reviewed and stable Respiratory status: spontaneous breathing, nonlabored ventilation, respiratory function stable and patient connected to nasal cannula oxygen Cardiovascular status: blood pressure returned to baseline and stable Postop Assessment: no apparent nausea or vomiting Anesthetic complications: no   No complications documented.  Last Vitals:  Vitals:   01/28/21 1533 01/28/21 1604  BP: (!) 96/41 (!) 102/42  Pulse: 77 70  Resp: 20 (!) 24  Temp:    SpO2: 93% 90%    Last Pain:  Vitals:   01/28/21 1051  TempSrc:   PainSc: 3                  Beacher Every COKER

## 2021-01-28 NOTE — Progress Notes (Signed)
Initial Nutrition Assessment  DOCUMENTATION CODES:   Obesity unspecified  INTERVENTION:   -Once diet is advanced, add:  -Ensure Enlive po BID, each supplement provides 350 kcal and 20 grams of protein -Continue MVI with minerals daily  NUTRITION DIAGNOSIS:   Increased nutrient needs related to post-op healing as evidenced by estimated needs.  GOAL:   Patient will meet greater than or equal to 90% of their needs  MONITOR:   PO intake,Supplement acceptance,Diet advancement,Labs,Weight trends,Skin,I & O's  REASON FOR ASSESSMENT:   Consult Assessment of nutrition requirement/status,Hip fracture protocol  ASSESSMENT:   This is an 87 year old female with history significant for hypertension as well as history of chronic left humeral fracture who presented to the ED after a mechanical fall at home in the setting of hallucinations and confusion.  She was noted to have a right distal acute femoral fracture as well as noted chronic left humeral fracture.  Family members were concerned about UTI which it appears that she has, and therefore, has been started on Rocephin empirically with urine cultures pending.  Orthopedics has evaluated patient with plans for ORIF of right distal femur fracture today.  Pt admitted with closed rt distal femur fracture and lt humerus fractures s/p mechanical fall and UTI.   Reviewed I/O's: +1.6 L x 24 hours  Pt unavailable at time of visit. Unable to obtain further nutrition-related history of complete nutrition-focused physical exam at this time.  Per orthopedics notes, plan for ORIF of rt distal femur fracture today. Pt has decided to pursue non-operative management of lt humerus fractures.   Reviewed wt hx; wt has been stable over the past year.   Pt with increased nutritional needs for post-operative healing and would benefit from addition of oral nutrition supplements.   Medications reviewed and include vitamin C, vitamin D, ferrous sulfate, and  lactated ringers infusion @ 100 ml/hr.   Labs reviewed.   Diet Order:   Diet Order            Diet NPO time specified  Diet effective now                 EDUCATION NEEDS:   No education needs have been identified at this time  Skin:     Last BM:     Height:   Ht Readings from Last 1 Encounters:  01/28/21 5\' 1"  (1.549 m)    Weight:   Wt Readings from Last 1 Encounters:  01/28/21 76.2 kg    Ideal Body Weight:  47.7 kg  BMI:  Body mass index is 31.74 kg/m.  Estimated Nutritional Needs:   Kcal:  1500-1700  Protein:  80-95 grams  Fluid:  > 1.5 L    Loistine Chance, RD, LDN, [place] Registered Dietitian II Certified Diabetes Care and Education Specialist Please refer to [facility] for RD and/or RD on-call/weekend/after hours pager

## 2021-01-28 NOTE — Progress Notes (Signed)
PROGRESS NOTE    [name]  IOX:[id number] DOB: Sep 03, 1931 DOA: 01/27/2021 PCP: Prince Solian, MD   Brief Narrative:  This is an 87 year old female with history significant for hypertension as well as history of chronic left humeral fracture who presented to the ED after a mechanical fall at home in the setting of hallucinations and confusion.  She was noted to have a right distal acute femoral fracture as well as noted chronic left humeral fracture.  Family members were concerned about UTI which it appears that she has, and therefore, has been started on Rocephin empirically with urine cultures pending.  Orthopedics has evaluated patient with plans for ORIF of right distal femur fracture today.  Assessment & Plan:   Principal Problem:   Closed fracture of right distal femur ([place]) Active Problems:   HTN (hypertension)   Bradycardia   RBBB   Closed fracture of left humerus   Delirium   Closed fracture right distal femur status post mechanical fall -Per orthopedics with plans for ORIF -Remains n.p.o. for now  Acute anemia-worsening -No overt bleeding noted, but may be related to trauma as above -Continue to monitor closely -Avoid antiplatelet agents until surgery and hemostasis achieved -Transfuse for hemoglobin less than 7  Hypotension-currently stable -Holding home BP medications -May resume as blood pressure is elevated  UTI -Currently on Rocephin empirically -Cultures are pending  Delirium versus metabolic encephalopathy secondary to UTI -Patient noted to have history of mild dementia -May require Haldol as needed for agitation  History of closed fracture to left humerus -Reviewed by Dr. Amedeo Plenty -Appears to have declined operative intervention in the past, continue sling  Chronic T11 and L1 compression fractures -No complaints of back pain currently noted   DVT prophylaxis: SCDs Code Status: Full Family Communication: Discussed with son at bedside  2/8 Disposition Plan:  Status is: Inpatient  Remains inpatient appropriate because:IV treatments appropriate due to intensity of illness or inability to take PO and Inpatient level of care appropriate due to severity of illness   Dispo: The patient is from: Home              Anticipated d/c is to: SNF              Anticipated d/c date is: 2 days              Patient currently is not medically stable to d/c.   Difficult to place patient No   Consultants:   Orthopedics  Trauma s/o  Procedures:   Anticipated ORIF today  Antimicrobials:  Anti-infectives (From admission, onward)   Start     Dose/Rate Route Frequency Ordered Stop   01/28/21 1045  ceFAZolin (ANCEF) IVPB 2g/100 mL premix        2 g 200 mL/hr over 30 Minutes Intravenous On call to O.R. 01/28/21 1030 01/29/21 0559   01/28/21 1034  ceFAZolin (ANCEF) 2-4 GM/100ML-% IVPB       Note to Pharmacy: Odelia Gage   : cabinet override      01/28/21 1034 01/28/21 2244   01/27/21 2045  [MAR Hold]  cefTRIAXone (ROCEPHIN) 1 g in sodium chloride 0.9 % 100 mL IVPB        (MAR Hold since Tue 01/28/2021 at 1024.Hold Reason: Transfer to a Procedural area.)   1 g 200 mL/hr over 30 Minutes Intravenous Every 24 hours 01/27/21 2042         Subjective: Patient seen and evaluated today and overall feels quite uncomfortable and is  complaining of some mild pain.  Son is at bedside.  Objective: Vitals:   01/28/21 0700 01/28/21 0730 01/28/21 0900 01/28/21 0930  BP: (!) 116/56 119/68 (!) 111/42 (!) 118/97  Pulse: 65 64 (!) 56 60  Resp: (!) 22 (!) 23 (!) 21 (!) 21  Temp:      TempSrc:      SpO2: 94% 94% 93% 94%    Intake/Output Summary (Last 24 hours) at 01/28/2021 1052 Last data filed at 01/27/2021 2331 Gross per 24 hour  Intake 1600 ml  Output --  Net 1600 ml   There were no vitals filed for this visit.  Examination:  General exam: Appears calm and comfortable  Respiratory system: Clear to auscultation. Respiratory effort  normal. Cardiovascular system: S1 & S2 heard, RRR.  Gastrointestinal system: Abdomen is soft Central nervous system: Alert and awake Extremities: Left arm in sling, right lower extremity with mild diffuse edema, currently in knee immobilizer. Skin: No significant lesions noted Psychiatry: Flat affect.    Data Reviewed: I have personally reviewed following labs and imaging studies  CBC: Recent Labs  Lab 01/27/21 1710 01/27/21 2209 01/28/21 0752  WBC 16.9*  --  7.8  NEUTROABS 14.1*  --   --   HGB 11.4* 9.8* 8.8*  HCT 37.3 31.0* 27.9*  MCV 97.1  --  96.5  PLT 208  --  357   Basic Metabolic Panel: Recent Labs  Lab 01/27/21 1710 01/28/21 0752  NA 139 138  K 4.1 4.0  CL 102 103  CO2 24 25  GLUCOSE 153* 112*  BUN 20 20  CREATININE 1.01* 1.02*  CALCIUM 8.9 8.3*   GFR: Estimated Creatinine Clearance: 36.3 mL/min (A) (by C-G formula based on SCr of 1.02 mg/dL (H)). Liver Function Tests: Recent Labs  Lab 01/27/21 1710  AST 23  ALT 21  ALKPHOS 50  BILITOT 0.7  PROT 6.1*  ALBUMIN 3.3*   No results for input(s): LIPASE, AMYLASE in the last 168 hours. No results for input(s): AMMONIA in the last 168 hours. Coagulation Profile: Recent Labs  Lab 01/27/21 1710  INR 1.1   Cardiac Enzymes: No results for input(s): CKTOTAL, CKMB, CKMBINDEX, TROPONINI in the last 168 hours. BNP (last 3 results) No results for input(s): PROBNP in the last 8760 hours. HbA1C: No results for input(s): HGBA1C in the last 72 hours. CBG: No results for input(s): GLUCAP in the last 168 hours. Lipid Profile: No results for input(s): CHOL, HDL, LDLCALC, TRIG, CHOLHDL, LDLDIRECT in the last 72 hours. Thyroid Function Tests: No results for input(s): TSH, T4TOTAL, FREET4, T3FREE, THYROIDAB in the last 72 hours. Anemia Panel: No results for input(s): VITAMINB12, FOLATE, FERRITIN, TIBC, IRON, RETICCTPCT in the last 72 hours. Sepsis Labs: No results for input(s): PROCALCITON, LATICACIDVEN in the  last 168 hours.  Recent Results (from the past 240 hour(s))  SARS CORONAVIRUS 2 (TAT 6-24 HRS) Nasopharyngeal Nasopharyngeal Swab     Status: None   Collection Time: 01/27/21  7:47 PM   Specimen: Nasopharyngeal Swab  Result Value Ref Range Status   SARS Coronavirus 2 NEGATIVE NEGATIVE Final    Comment: (NOTE) SARS-CoV-2 target nucleic acids are NOT DETECTED.  The SARS-CoV-2 RNA is generally detectable in upper and lower respiratory specimens during the acute phase of infection. Negative results do not preclude SARS-CoV-2 infection, do not rule out co-infections with other pathogens, and should not be used as the sole basis for treatment or other patient management decisions. Negative results must be combined with clinical  observations, patient history, and epidemiological information. The expected result is Negative.  Fact Sheet for Patients: SugarRoll.be  Fact Sheet for Healthcare Providers: https://www.woods-mathews.com/  This test is not yet approved or cleared by the [place] FDA and  has been authorized for detection and/or diagnosis of SARS-CoV-2 by FDA under an Emergency Use Authorization (EUA). This EUA will remain  in effect (meaning this test can be used) for the duration of the COVID-19 declaration under Se ction 564(b)(1) of the Act, 21 U.S.C. section 360bbb-3(b)(1), unless the authorization is terminated or revoked sooner.  Performed at [facility], [place] [address] [place], [place] [address]          Radiology Studies: CT Head Wo Contrast  Result Date: 01/27/2021 CLINICAL DATA:  Head trauma, minor. Additional history provided: Fall, neck pain. EXAM: CT HEAD WITHOUT CONTRAST TECHNIQUE: Contiguous axial images were obtained from the base of the skull through the vertex without intravenous contrast. COMPARISON:  No pertinent prior exams available for comparison. FINDINGS: Brain: Mild cerebral atrophy. There is  no acute intracranial hemorrhage. No demarcated cortical infarct. No extra-axial fluid collection. No evidence of intracranial mass. No midline shift. Vascular: No hyperdense vessel.  Atherosclerotic calcifications. Skull: Normal. Negative for fracture or focal lesion. Sinuses/Orbits: Visualized orbits show no acute finding. No significant paranasal sinus disease. IMPRESSION: No evidence of acute intracranial abnormality. Mild cerebral atrophy. Electronically Signed   By: Kellie Simmering DO   On: 01/27/2021 17:40   CT Cervical Spine Wo Contrast  Result Date: 01/27/2021 CLINICAL DATA:  Neck trauma.  Fall/neck pain. EXAM: CT CERVICAL SPINE WITHOUT CONTRAST TECHNIQUE: Multidetector CT imaging of the cervical spine was performed without intravenous contrast. Multiplanar CT image reconstructions were also generated. COMPARISON:  Chest radiographs 01/07/2010. FINDINGS: Alignment: Trace C4-C5 grade 1 retrolisthesis. 2 mm C7-T1 grade 1 anterolisthesis. Trace T2-T3 grade 1 anterolisthesis. Skull base and vertebrae: The basion-dental and atlanto-dental intervals are maintained.No evidence of acute fracture to the cervical spine. Mild age-indeterminate T2 superior endplate compression deformity with superimposed Schmorl node. Age-indeterminate T3 vertebral compression deformity (30-40% height loss). Soft tissues and spinal canal: No prevertebral fluid or swelling. No visible canal hematoma. Disc levels: Cervical spondylosis with multilevel disc space narrowing, disc bulges, posterior disc osteophytes, uncovertebral hypertrophy and facet arthrosis. Disc space narrowing is moderate/advanced at C4-C5 and C6-C7. Additionally, small central disc protrusions are present at C2-C3 and C3-C4. Facet joint ankylosis on the left at C5-C6. Facet joint ankylosis is also suspected bilaterally at T3-T4. Upper chest: No consolidation within the imaged lung apices. No visible pneumothorax. IMPRESSION: 1. No evidence of acute fracture to the  cervical spine. 2. Age-indeterminate T2 and T3 vertebral compression deformities, as described. 3. Mild C4-C5 grade 1 retrolisthesis. 4. Mild C7-T1 and T2-T3 grade 1 anterolisthesis. 5. Cervical spondylosis, as described. 6. Levels of facet joint ankylosis within the cervical and visualized upper thoracic spine. Electronically Signed   By: Kellie Simmering DO   On: 01/27/2021 17:54   CT Abdomen Pelvis W Contrast  Result Date: 01/27/2021 CLINICAL DATA:  [place] from standing EXAM: CT ABDOMEN AND PELVIS WITH CONTRAST TECHNIQUE: Multidetector CT imaging of the abdomen and pelvis was performed using the standard protocol following bolus administration of intravenous contrast. CONTRAST:  163mL OMNIPAQUE IOHEXOL 300 MG/ML  SOLN COMPARISON:  None. FINDINGS: Lower chest: No acute pleural or parenchymal lung disease. Hepatobiliary: No focal liver abnormality is seen. Status post cholecystectomy. No biliary dilatation. Pancreas: Unremarkable. No pancreatic ductal dilatation or surrounding inflammatory changes. Spleen:  Normal in size without focal abnormality. Adrenals/Urinary Tract: Adrenal glands are unremarkable. Kidneys are normal, without renal calculi, focal lesion, or hydronephrosis. Bladder is unremarkable. Stomach/Bowel: No bowel obstruction or ileus. No bowel wall thickening or inflammatory change. Vascular/Lymphatic: There is ectasia of the thoracoabdominal aorta without aneurysm. Diffuse atherosclerosis. No pathologic adenopathy. Surgical clips along the left pelvic sidewall and retroperitoneum consistent with lymphadenectomy. Reproductive: Status post hysterectomy. No adnexal masses. Other: No free fluid or free gas. Midline supraumbilical ventral hernia contains a portion of the transverse colon, without bowel obstruction or strangulation. There is a small fat containing umbilical hernia, as well as bilateral fat containing inguinal hernias. Musculoskeletal: There is a chronic L1 compression deformity with vertebra  plana and evidence of prior vertebral augmentation. Chronic T11 compression deformity also noted, approximately 50% loss of height. No acute displaced fractures. Reconstructed images demonstrate no additional findings. IMPRESSION: 1. Midline supraumbilical ventral hernia contains a portion of the transverse colon, without bowel obstruction or strangulation. 2. Small fat containing umbilical and bilateral inguinal hernias. 3. Chronic T11 and L1 compression fractures as above. Electronically Signed   By: Randa Ngo M.D.   On: 01/27/2021 20:37   DG Pelvis Portable  Result Date: 01/27/2021 CLINICAL DATA:  [place] EXAM: PORTABLE PELVIS 1-2 VIEWS COMPARISON:  None. FINDINGS: Supine frontal view of the pelvis was obtained, limited by positioning and collimation. The left hemipelvis and left hip are excluded by collimation. I do not see any acute fracture within the visualized portions of the pelvis or right hip. Soft tissues are unremarkable. IMPRESSION: 1. No acute pelvic fracture on this limited evaluation. Electronically Signed   By: Randa Ngo M.D.   On: 01/27/2021 16:22   DG Chest Port 1 View  Result Date: 01/27/2021 CLINICAL DATA:  87 year old female with fall. EXAM: PORTABLE CHEST 1 VIEW COMPARISON:  Chest radiograph dated 06/07/2010. FINDINGS: No focal consolidation, pleural effusion or pneumothorax. Stable cardiac silhouette. Atherosclerotic calcification of the aorta. Osteopenia with degenerative changes of the spine. Old right posterior rib fractures. Left humeral neck fracture better seen on the upper extremity radiograph. Partially visualized left upper extremity hardware. IMPRESSION: 1. No active cardiopulmonary disease. 2. Fracture of the left humeral neck. Electronically Signed   By: Anner Crete M.D.   On: 01/27/2021 16:16   DG Knee Right Port  Result Date: 01/27/2021 CLINICAL DATA:  [place] EXAM: PORTABLE RIGHT KNEE - 1-2 VIEW COMPARISON:  None. FINDINGS: Frontal and lateral views of the  right knee are obtained. There is a comminuted oblique fracture of the distal right femur, with approximately 15 mm of ventral displacement of the distal fracture fragment. The fracture line extends to the trochlear groove of the patellofemoral articulation, consistent with intra-articular fracture. There is a large joint effusion. Diffuse soft tissue edema. IMPRESSION: 1. Intra-articular comminuted fracture of the distal right femur, with 15 mm of ventral displacement of the distal fracture fragment. 2. Large joint effusion. Electronically Signed   By: Randa Ngo M.D.   On: 01/27/2021 16:20   DG Humerus Left  Result Date: 01/27/2021 CLINICAL DATA:  87 year old female with fall. EXAM: LEFT HUMERUS - 2+ VIEW COMPARISON:  Chest radiograph dated 01/27/2021. FINDINGS: There is a mildly displaced fracture humeral neck of the left with approximately 4 mm medial displacement of the distal fracture fragment. There is fracture of the distal third of the left humeral diaphysis with displacement of the distal fracture fragment and internal fixation hardware. There is advanced osteopenia. The soft tissues are grossly unremarkable. IMPRESSION:  1. Mildly displaced fracture of the left humeral neck. 2. Displaced fracture of the distal humeral diaphysis with displacement of the internal fixation hardware. Electronically Signed   By: Anner Crete M.D.   On: 01/27/2021 16:20   DG Femur Min 2 Views Right  Result Date: 01/27/2021 CLINICAL DATA:  Distal right femur pain and deformity post fall EXAM: RIGHT FEMUR 2 VIEWS COMPARISON:  None. FINDINGS: There is a comminuted oblique fracture of the distal femoral metadiaphysis with approximately 1 shaft with of medial displacement and impaction of the distal fracture fragment. IMPRESSION: Comminuted displaced oblique fracture of the distal femoral metadiaphysis. Electronically Signed   By: Dahlia Bailiff MD   On: 01/27/2021 18:09        Scheduled Meds: . [MAR Hold]  vitamin C  500 mg Oral Daily  . chlorhexidine  60 mL Topical Once  . [MAR Hold] cholecalciferol  1,000 Units Oral Daily  . [MAR Hold] divalproex  125 mg Oral BID  . [MAR Hold] donepezil  10 mg Oral QHS  . [MAR Hold] ferrous sulfate  325 mg Oral Daily  . [MAR Hold] levothyroxine  125 mcg Oral QAC breakfast  . [MAR Hold] loratadine  10 mg Oral Daily  . [MAR Hold] multivitamin  1 tablet Oral QHS  . [MAR Hold] multivitamin with minerals  1 tablet Oral QHS  . [MAR Hold] oyster calcium  1,250 mg Oral QHS  . [MAR Hold] pantoprazole  40 mg Oral Daily  . [MAR Hold] rosuvastatin  20 mg Oral QHS   Continuous Infusions: . ceFAZolin    .  ceFAZolin (ANCEF) IV    . [MAR Hold] cefTRIAXone (ROCEPHIN)  IV Stopped (01/27/21 2300)  . lactated ringers 100 mL/hr at 01/28/21 0933     LOS: 1 day    Time spent: 35 minutes    Kinsleigh Ludolph D Manuella Ghazi, DO [facility]  If 7PM-7AM, please contact night-coverage [email or web address] 01/28/2021, 10:52 AM

## 2021-01-28 NOTE — Consult Note (Signed)
Reason for Consult:Right distal femur fx Referring Physician: [facility] Time called: 0803 Time at bedside: [name] is an 87 y.o. female.  HPI: [name] was at home and going towards the bathroom when she fell. She had immediate right knee and bilateral shoulder pain and could not get up. She was brought to the ED where x-rays showed a distal femur fx as well as humeral fxs of uncertain chronicity. Orthopedic surgery was consulted. Due to the complexity of the fracture orthopedic trauma consultation was requested for the femur fracture. She denies other c/o. She lives with her son who helps care for her. There was some concern she may have been developing a UTI which may explain her fall.  Past Medical History:  Diagnosis Date  . Hiatal hernia   . Hyperlipidemia   . Hypertension   . Hypothyroidism   . Macular degeneration   . Obesity   . Osteoporosis   . Ovarian cancer ([place])    Stage IA grade 2 ovarian cancer    Past Surgical History:  Procedure Laterality Date  . CHOLECYSTECTOMY, LAPAROSCOPIC    . colectomy  2005    Rectosigmoid colectomy with reanastomosis  . ELBOW ARTHROPLASTY     replacing the humeral stem  . KYPHOPLASTY N/A 03/02/2013   Procedure: KYPHOPLASTY;  Surgeon: Erline Levine, MD;  Location: [facility];  Service: Neurosurgery;  Laterality: N/A;  Lumbar One Kyphoplasty  . OTHER SURGICAL HISTORY    . OTHER SURGICAL HISTORY     Ovarian cancer resection and staging    Family History  Problem Relation Age of Onset  . Heart attack Father     Social History:  reports that she quit smoking about 29 years ago. Her smoking use included cigarettes. She has never used smokeless tobacco. She reports previous alcohol use. She reports that she does not use drugs.  Allergies: No Known Allergies  Medications: I have reviewed the patient's current medications.  Results for orders placed or performed during the hospital encounter of 01/27/21 (from the past 48  hour(s))  Comprehensive metabolic panel     Status: Abnormal   Collection Time: 01/27/21  5:10 PM  Result Value Ref Range   Sodium 139 135 - 145 mmol/L   Potassium 4.1 3.5 - 5.1 mmol/L   Chloride 102 98 - 111 mmol/L   CO2 24 22 - 32 mmol/L   Glucose, Bld 153 (H) 70 - 99 mg/dL    Comment: Glucose reference range applies only to samples taken after fasting for at least 8 hours.   BUN 20 8 - 23 mg/dL   Creatinine, Ser 1.01 (H) 0.44 - 1.00 mg/dL   Calcium 8.9 8.9 - 10.3 mg/dL   Total Protein 6.1 (L) 6.5 - 8.1 g/dL   Albumin 3.3 (L) 3.5 - 5.0 g/dL   AST 23 15 - 41 U/L   ALT 21 0 - 44 U/L   Alkaline Phosphatase 50 38 - 126 U/L   Total Bilirubin 0.7 0.3 - 1.2 mg/dL   GFR, Estimated 54 (L) >60 mL/min    Comment: (NOTE) Calculated using the CKD-EPI Creatinine Equation (2021)    Anion gap 13 5 - 15    Comment: Performed at [facility], [place] [address], [place], [place] [address]  CBC with Differential     Status: Abnormal   Collection Time: 01/27/21  5:10 PM  Result Value Ref Range   WBC 16.9 (H) 4.0 - 10.5 K/uL   RBC  3.84 (L) 3.87 - 5.11 MIL/uL   Hemoglobin 11.4 (L) 12.0 - 15.0 g/dL   HCT 37.3 36.0 - 46.0 %   MCV 97.1 80.0 - 100.0 fL   MCH 29.7 26.0 - 34.0 pg   MCHC 30.6 30.0 - 36.0 g/dL   RDW 13.3 11.5 - 15.5 %   Platelets 208 150 - 400 K/uL   nRBC 0.0 0.0 - 0.2 %   Neutrophils Relative % 83 %   Neutro Abs 14.1 (H) 1.7 - 7.7 K/uL   Lymphocytes Relative 7 %   Lymphs Abs 1.2 0.7 - 4.0 K/uL   Monocytes Relative 9 %   Monocytes Absolute 1.5 (H) 0.1 - 1.0 K/uL   Eosinophils Relative 0 %   Eosinophils Absolute 0.0 0.0 - 0.5 K/uL   Basophils Relative 0 %   Basophils Absolute 0.1 0.0 - 0.1 K/uL   Immature Granulocytes 1 %   Abs Immature Granulocytes 0.10 (H) 0.00 - 0.07 K/uL    Comment: Performed at [facility] [address] [place], [place] [address]  Protime-INR     Status: None   Collection Time: 01/27/21  5:10 PM  Result Value Ref Range   Prothrombin Time  13.4 11.4 - 15.2 seconds   INR 1.1 0.8 - 1.2    Comment: (NOTE) INR goal varies based on device and disease states. Performed at [facility], [place] [address], [place], [place] [address]   Type and screen [place]     Status: None   Collection Time: 01/27/21  5:10 PM  Result Value Ref Range   ABO/RH(D) A POS    Antibody Screen NEG    Sample Expiration      01/30/2021,2359 Performed at [facility], [place] [address], [place], [place] [address]   ABO/Rh     Status: None   Collection Time: 01/27/21  6:28 PM  Result Value Ref Range   ABO/RH(D)      A POS Performed at [facility] [address] [place], [place] [address]   SARS CORONAVIRUS 2 (TAT 6-24 HRS) Nasopharyngeal Nasopharyngeal Swab     Status: None   Collection Time: 01/27/21  7:47 PM   Specimen: Nasopharyngeal Swab  Result Value Ref Range   SARS Coronavirus 2 NEGATIVE NEGATIVE    Comment: (NOTE) SARS-CoV-2 target nucleic acids are NOT DETECTED.  The SARS-CoV-2 RNA is generally detectable in upper and lower respiratory specimens during the acute phase of infection. Negative results do not preclude SARS-CoV-2 infection, do not rule out co-infections with other pathogens, and should not be used as the sole basis for treatment or other patient management decisions. Negative results must be combined with clinical observations, patient history, and epidemiological information. The expected result is Negative.  Fact Sheet for Patients: SugarRoll.be  Fact Sheet for Healthcare Providers: https://www.woods-mathews.com/  This test is not yet approved or cleared by the [place] FDA and  has been authorized for detection and/or diagnosis of SARS-CoV-2 by FDA under an Emergency Use Authorization (EUA). This EUA will remain  in effect (meaning this test can be used) for the duration of the COVID-19 declaration under Se ction 564(b)(1) of the  Act, 21 U.S.C. section 360bbb-3(b)(1), unless the authorization is terminated or revoked sooner.  Performed at [facility], [place] [address] [place], [place] [address]   Urinalysis, Routine w reflex microscopic Urine, Clean Catch     Status: Abnormal   Collection Time: 01/27/21  7:54 PM  Result Value Ref Range   Color, Urine AMBER (A) YELLOW    Comment: BIOCHEMICALS MAY BE AFFECTED BY COLOR   APPearance HAZY (A) CLEAR   Specific Gravity, Urine 1.026 1.005 - 1.030   pH 5.0 5.0 - 8.0   Glucose, UA NEGATIVE NEGATIVE mg/dL   Hgb urine dipstick MODERATE (A) NEGATIVE   Bilirubin Urine NEGATIVE NEGATIVE   Ketones, ur 5 (A) NEGATIVE mg/dL   Protein, ur 30 (A) NEGATIVE mg/dL   Nitrite NEGATIVE NEGATIVE   Leukocytes,Ua MODERATE (A) NEGATIVE   RBC / HPF 0-5 0 - 5 RBC/hpf   WBC, UA 21-50 0 - 5 WBC/hpf   Bacteria, UA RARE (A) NONE SEEN   Squamous Epithelial / LPF 0-5 0 - 5   Mucus PRESENT    Hyaline Casts, UA PRESENT     Comment: Performed at [facility], [address] [place], [place] [address]  Hemoglobin and hematocrit, blood     Status: Abnormal   Collection Time: 01/27/21 10:09 PM  Result Value Ref Range   Hemoglobin 9.8 (L) 12.0 - 15.0 g/dL   HCT 31.0 (L) 36.0 - 46.0 %    Comment: Performed at [facility], [place] [address] [place], [place] [address]  CBC     Status: Abnormal   Collection Time: 01/28/21  7:52 AM  Result Value Ref Range   WBC 7.8 4.0 - 10.5 K/uL   RBC 2.89 (L) 3.87 - 5.11 MIL/uL   Hemoglobin 8.8 (L) 12.0 - 15.0 g/dL   HCT 27.9 (L) 36.0 - 46.0 %   MCV 96.5 80.0 - 100.0 fL   MCH 30.4 26.0 - 34.0 pg   MCHC 31.5 30.0 - 36.0 g/dL   RDW 13.6 11.5 - 15.5 %   Platelets 157 150 - 400 K/uL   nRBC 0.0 0.0 - 0.2 %    Comment: Performed at [facility], [place] [address] [place], [place] [address]  Basic metabolic panel     Status: Abnormal   Collection Time: 01/28/21  7:52 AM  Result Value Ref Range   Sodium 138 135 - 145 mmol/L   Potassium  4.0 3.5 - 5.1 mmol/L   Chloride 103 98 - 111 mmol/L   CO2 25 22 - 32 mmol/L   Glucose, Bld 112 (H) 70 - 99 mg/dL    Comment: Glucose reference range applies only to samples taken after fasting for at least 8 hours.   BUN 20 8 - 23 mg/dL   Creatinine, Ser 1.02 (H) 0.44 - 1.00 mg/dL   Calcium 8.3 (L) 8.9 - 10.3 mg/dL   GFR, Estimated 53 (L) >60 mL/min    Comment: (NOTE) Calculated using the CKD-EPI Creatinine Equation (2021)    Anion gap 10 5 - 15    Comment: Performed at [facility] [address] [place],  [address]    CT Head Wo Contrast  Result Date: 01/27/2021 CLINICAL DATA:  Head trauma, minor. Additional history provided: Fall, neck pain. EXAM: CT HEAD WITHOUT CONTRAST TECHNIQUE: Contiguous axial images were obtained from the base of the skull through the vertex without intravenous contrast. COMPARISON:  No pertinent prior exams available for comparison. FINDINGS: Brain: Mild cerebral atrophy. There is no acute intracranial hemorrhage. No demarcated cortical infarct. No extra-axial fluid collection. No evidence of intracranial mass. No midline shift. Vascular: No hyperdense vessel.  Atherosclerotic calcifications. Skull: Normal. Negative for fracture or focal lesion. Sinuses/Orbits: Visualized orbits show no acute  finding. No significant paranasal sinus disease. IMPRESSION: No evidence of acute intracranial abnormality. Mild cerebral atrophy. Electronically Signed   By: Kellie Simmering DO   On: 01/27/2021 17:40   CT Cervical Spine Wo Contrast  Result Date: 01/27/2021 CLINICAL DATA:  Neck trauma.  Fall/neck pain. EXAM: CT CERVICAL SPINE WITHOUT CONTRAST TECHNIQUE: Multidetector CT imaging of the cervical spine was performed without intravenous contrast. Multiplanar CT image reconstructions were also generated. COMPARISON:  Chest radiographs 01/07/2010. FINDINGS: Alignment: Trace C4-C5 grade 1 retrolisthesis. 2 mm C7-T1 grade 1 anterolisthesis. Trace T2-T3 grade 1 anterolisthesis.  Skull base and vertebrae: The basion-dental and atlanto-dental intervals are maintained.No evidence of acute fracture to the cervical spine. Mild age-indeterminate T2 superior endplate compression deformity with superimposed Schmorl node. Age-indeterminate T3 vertebral compression deformity (30-40% height loss). Soft tissues and spinal canal: No prevertebral fluid or swelling. No visible canal hematoma. Disc levels: Cervical spondylosis with multilevel disc space narrowing, disc bulges, posterior disc osteophytes, uncovertebral hypertrophy and facet arthrosis. Disc space narrowing is moderate/advanced at C4-C5 and C6-C7. Additionally, small central disc protrusions are present at C2-C3 and C3-C4. Facet joint ankylosis on the left at C5-C6. Facet joint ankylosis is also suspected bilaterally at T3-T4. Upper chest: No consolidation within the imaged lung apices. No visible pneumothorax. IMPRESSION: 1. No evidence of acute fracture to the cervical spine. 2. Age-indeterminate T2 and T3 vertebral compression deformities, as described. 3. Mild C4-C5 grade 1 retrolisthesis. 4. Mild C7-T1 and T2-T3 grade 1 anterolisthesis. 5. Cervical spondylosis, as described. 6. Levels of facet joint ankylosis within the cervical and visualized upper thoracic spine. Electronically Signed   By: Kellie Simmering DO   On: 01/27/2021 17:54   CT Abdomen Pelvis W Contrast  Result Date: 01/27/2021 CLINICAL DATA:  [place] from standing EXAM: CT ABDOMEN AND PELVIS WITH CONTRAST TECHNIQUE: Multidetector CT imaging of the abdomen and pelvis was performed using the standard protocol following bolus administration of intravenous contrast. CONTRAST:  18mL OMNIPAQUE IOHEXOL 300 MG/ML  SOLN COMPARISON:  None. FINDINGS: Lower chest: No acute pleural or parenchymal lung disease. Hepatobiliary: No focal liver abnormality is seen. Status post cholecystectomy. No biliary dilatation. Pancreas: Unremarkable. No pancreatic ductal dilatation or surrounding  inflammatory changes. Spleen: Normal in size without focal abnormality. Adrenals/Urinary Tract: Adrenal glands are unremarkable. Kidneys are normal, without renal calculi, focal lesion, or hydronephrosis. Bladder is unremarkable. Stomach/Bowel: No bowel obstruction or ileus. No bowel wall thickening or inflammatory change. Vascular/Lymphatic: There is ectasia of the thoracoabdominal aorta without aneurysm. Diffuse atherosclerosis. No pathologic adenopathy. Surgical clips along the left pelvic sidewall and retroperitoneum consistent with lymphadenectomy. Reproductive: Status post hysterectomy. No adnexal masses. Other: No free fluid or free gas. Midline supraumbilical ventral hernia contains a portion of the transverse colon, without bowel obstruction or strangulation. There is a small fat containing umbilical hernia, as well as bilateral fat containing inguinal hernias. Musculoskeletal: There is a chronic L1 compression deformity with vertebra plana and evidence of prior vertebral augmentation. Chronic T11 compression deformity also noted, approximately 50% loss of height. No acute displaced fractures. Reconstructed images demonstrate no additional findings. IMPRESSION: 1. Midline supraumbilical ventral hernia contains a portion of the transverse colon, without bowel obstruction or strangulation. 2. Small fat containing umbilical and bilateral inguinal hernias. 3. Chronic T11 and L1 compression fractures as above. Electronically Signed   By: Randa Ngo M.D.   On: 01/27/2021 20:37   DG Pelvis Portable  Result Date: 01/27/2021 CLINICAL DATA:  [place] EXAM: PORTABLE PELVIS 1-2 VIEWS COMPARISON:  None. FINDINGS:  Supine frontal view of the pelvis was obtained, limited by positioning and collimation. The left hemipelvis and left hip are excluded by collimation. I do not see any acute fracture within the visualized portions of the pelvis or right hip. Soft tissues are unremarkable. IMPRESSION: 1. No acute pelvic  fracture on this limited evaluation. Electronically Signed   By: Randa Ngo M.D.   On: 01/27/2021 16:22   DG Chest Port 1 View  Result Date: 01/27/2021 CLINICAL DATA:  87 year old female with fall. EXAM: PORTABLE CHEST 1 VIEW COMPARISON:  Chest radiograph dated 06/07/2010. FINDINGS: No focal consolidation, pleural effusion or pneumothorax. Stable cardiac silhouette. Atherosclerotic calcification of the aorta. Osteopenia with degenerative changes of the spine. Old right posterior rib fractures. Left humeral neck fracture better seen on the upper extremity radiograph. Partially visualized left upper extremity hardware. IMPRESSION: 1. No active cardiopulmonary disease. 2. Fracture of the left humeral neck. Electronically Signed   By: Anner Crete M.D.   On: 01/27/2021 16:16   DG Knee Right Port  Result Date: 01/27/2021 CLINICAL DATA:  [place] EXAM: PORTABLE RIGHT KNEE - 1-2 VIEW COMPARISON:  None. FINDINGS: Frontal and lateral views of the right knee are obtained. There is a comminuted oblique fracture of the distal right femur, with approximately 15 mm of ventral displacement of the distal fracture fragment. The fracture line extends to the trochlear groove of the patellofemoral articulation, consistent with intra-articular fracture. There is a large joint effusion. Diffuse soft tissue edema. IMPRESSION: 1. Intra-articular comminuted fracture of the distal right femur, with 15 mm of ventral displacement of the distal fracture fragment. 2. Large joint effusion. Electronically Signed   By: Randa Ngo M.D.   On: 01/27/2021 16:20   DG Humerus Left  Result Date: 01/27/2021 CLINICAL DATA:  87 year old female with fall. EXAM: LEFT HUMERUS - 2+ VIEW COMPARISON:  Chest radiograph dated 01/27/2021. FINDINGS: There is a mildly displaced fracture humeral neck of the left with approximately 4 mm medial displacement of the distal fracture fragment. There is fracture of the distal third of the left humeral  diaphysis with displacement of the distal fracture fragment and internal fixation hardware. There is advanced osteopenia. The soft tissues are grossly unremarkable. IMPRESSION: 1. Mildly displaced fracture of the left humeral neck. 2. Displaced fracture of the distal humeral diaphysis with displacement of the internal fixation hardware. Electronically Signed   By: Anner Crete M.D.   On: 01/27/2021 16:20   DG Femur Min 2 Views Right  Result Date: 01/27/2021 CLINICAL DATA:  Distal right femur pain and deformity post fall EXAM: RIGHT FEMUR 2 VIEWS COMPARISON:  None. FINDINGS: There is a comminuted oblique fracture of the distal femoral metadiaphysis with approximately 1 shaft with of medial displacement and impaction of the distal fracture fragment. IMPRESSION: Comminuted displaced oblique fracture of the distal femoral metadiaphysis. Electronically Signed   By: Dahlia Bailiff MD   On: 01/27/2021 18:09    Review of Systems  HENT: Negative for ear discharge, ear pain, hearing loss and tinnitus.   Eyes: Negative for photophobia and pain.  Respiratory: Negative for cough and shortness of breath.   Cardiovascular: Negative for chest pain.  Gastrointestinal: Negative for abdominal pain, nausea and vomiting.  Genitourinary: Negative for dysuria, flank pain, frequency and urgency.  Musculoskeletal: Positive for arthralgias (Bilateral arms, right knee). Negative for back pain, myalgias and neck pain.  Neurological: Negative for dizziness and headaches.  Hematological: Does not bruise/bleed easily.  Psychiatric/Behavioral: The patient is not nervous/anxious.  Blood pressure (!) 111/42, pulse (!) 56, temperature (!) 97.5 F (36.4 C), temperature source Oral, resp. rate (!) 21, SpO2 93 %. Physical Exam Constitutional:      General: She is not in acute distress.    Appearance: She is well-developed and well-nourished. She is not diaphoretic.  HENT:     Head: Normocephalic and atraumatic.  Eyes:      General: No scleral icterus.       Right eye: No discharge.        Left eye: No discharge.     Conjunctiva/sclera: Conjunctivae normal.  Cardiovascular:     Rate and Rhythm: Normal rate and regular rhythm.  Pulmonary:     Effort: Pulmonary effort is normal. No respiratory distress.  Musculoskeletal:     Cervical back: Normal range of motion.     Comments: RLE No traumatic wounds, ecchymosis, or rash  KI in place  No ankle effusion  Sens DPN, SPN, TN intact  Motor EHL, ext, flex, evers 5/5  DP 2+, PT 1+, No significant edema  Skin:    General: Skin is warm and dry.  Neurological:     Mental Status: She is alert.  Psychiatric:        Mood and Affect: Mood and affect normal.        Behavior: Behavior normal.     Assessment/Plan: Right distal femur fx -- Plan ORIF today by Dr. Marcelino Scot. Please keep NPO. Left humerus fxs -- Dr. Amedeo Plenty to evaluate and provide recommendations. HTN    Lisette Abu, PA-C Orthopedic Surgery [phone] 01/28/2021, 9:34 AM

## 2021-01-28 NOTE — Transfer of Care (Signed)
Immediate Anesthesia Transfer of Care Note  Patient: [name]  Procedure(s) Performed: OPEN REDUCTION INTERNAL FIXATION (ORIF) DISTAL FEMUR FRACTURE (Right Leg Upper)  Patient Location: PACU  Anesthesia Type:General  Level of Consciousness: awake  Airway & Oxygen Therapy: Patient Spontanous Breathing and Patient connected to face mask oxygen  Post-op Assessment: Report given to RN and Post -op Vital signs reviewed and stable  Post vital signs: Reviewed and stable  Last Vitals:  Vitals Value Taken Time  BP 135/42 01/28/21 1349  Temp    Pulse 81 01/28/21 1351  Resp 18 01/28/21 1351  SpO2 96 % 01/28/21 1351  Vitals shown include unvalidated device data.  Last Pain:  Vitals:   01/28/21 1051  TempSrc:   PainSc: 3       Patients Stated Pain Goal: 2 (24/49/75 3005)  Complications: No complications documented.

## 2021-01-28 NOTE — ED Notes (Signed)
Breakfast Ordered 

## 2021-01-28 NOTE — Progress Notes (Signed)
Progress Note     Subjective: Patient reports pain, unsure if it is anywhere other than L arm and R leg. She denies chest pain or SOB. She denies abdominal pain, n/v. Son at bedside and reports her shoulder was replaced over 20 years ago but she started having problems with prosthesis 4-5 years ago.   Objective: Vital signs in last 24 hours: Temp:  [97.5 F (36.4 C)] 97.5 F (36.4 C) (02/07 1521) Pulse Rate:  [47-73] 64 (02/08 0730) Resp:  [16-29] 23 (02/08 0730) BP: (79-142)/(36-96) 119/68 (02/08 0730) SpO2:  [92 %-98 %] 94 % (02/08 0730)    Intake/Output from previous day: 02/07 0701 - 02/08 0700 In: 1600 [IV Piggyback:1600] Out: -  Intake/Output this shift: No intake/output data recorded.   PE: General: resting, appears stated age, no apparent distress Neurological: alert and oriented, no focal deficits, cranial nerves grossly in tact HEENT: normocephalic, atraumatic, no scleral icterus. No scalp or facial lacerations, no periorbital edema or ecchymosis. CV: regular rate and rhythm, extremities warm and well-perfused Respiratory: normal work of breathing, symmetric chest wall expansion. No chest wall deformities. Abdomen: soft, nondistended, nontender to deep palpation. No masses or organomegaly. No abdominal wall abrasions or ecchymosis. Extremities: warm and well-perfused. Sling in place LUE. BL hands NVI; Knee immobilizer in place RLE. Palpable right pedal pulses. BL feet NVI Psychiatric: normal mood and affect Skin: warm and dry, no jaundice, no rashes or lesions   Lab Results:  Recent Labs    01/27/21 1710 01/27/21 2209  WBC 16.9*  --   HGB 11.4* 9.8*  HCT 37.3 31.0*  PLT 208  --    BMET Recent Labs    01/27/21 1710  NA 139  K 4.1  CL 102  CO2 24  GLUCOSE 153*  BUN 20  CREATININE 1.01*  CALCIUM 8.9   PT/INR Recent Labs    01/27/21 1710  LABPROT 13.4  INR 1.1   CMP     Component Value Date/Time   NA 139 01/27/2021 1710   K 4.1  01/27/2021 1710   CL 102 01/27/2021 1710   CO2 24 01/27/2021 1710   GLUCOSE 153 (H) 01/27/2021 1710   BUN 20 01/27/2021 1710   CREATININE 1.01 (H) 01/27/2021 1710   CALCIUM 8.9 01/27/2021 1710   PROT 6.1 (L) 01/27/2021 1710   ALBUMIN 3.3 (L) 01/27/2021 1710   AST 23 01/27/2021 1710   ALT 21 01/27/2021 1710   ALKPHOS 50 01/27/2021 1710   BILITOT 0.7 01/27/2021 1710   GFRNONAA 54 (L) 01/27/2021 1710   GFRAA 33 (L) 09/18/2015 1155   Lipase     Component Value Date/Time   LIPASE 22 09/18/2015 1155       Studies/Results: CT Head Wo Contrast  Result Date: 01/27/2021 CLINICAL DATA:  Head trauma, minor. Additional history provided: Fall, neck pain. EXAM: CT HEAD WITHOUT CONTRAST TECHNIQUE: Contiguous axial images were obtained from the base of the skull through the vertex without intravenous contrast. COMPARISON:  No pertinent prior exams available for comparison. FINDINGS: Brain: Mild cerebral atrophy. There is no acute intracranial hemorrhage. No demarcated cortical infarct. No extra-axial fluid collection. No evidence of intracranial mass. No midline shift. Vascular: No hyperdense vessel.  Atherosclerotic calcifications. Skull: Normal. Negative for fracture or focal lesion. Sinuses/Orbits: Visualized orbits show no acute finding. No significant paranasal sinus disease. IMPRESSION: No evidence of acute intracranial abnormality. Mild cerebral atrophy. Electronically Signed   By: Kellie Simmering DO   On: 01/27/2021 17:40   CT  Cervical Spine Wo Contrast  Result Date: 01/27/2021 CLINICAL DATA:  Neck trauma.  Fall/neck pain. EXAM: CT CERVICAL SPINE WITHOUT CONTRAST TECHNIQUE: Multidetector CT imaging of the cervical spine was performed without intravenous contrast. Multiplanar CT image reconstructions were also generated. COMPARISON:  Chest radiographs 01/07/2010. FINDINGS: Alignment: Trace C4-C5 grade 1 retrolisthesis. 2 mm C7-T1 grade 1 anterolisthesis. Trace T2-T3 grade 1 anterolisthesis. Skull  base and vertebrae: The basion-dental and atlanto-dental intervals are maintained.No evidence of acute fracture to the cervical spine. Mild age-indeterminate T2 superior endplate compression deformity with superimposed Schmorl node. Age-indeterminate T3 vertebral compression deformity (30-40% height loss). Soft tissues and spinal canal: No prevertebral fluid or swelling. No visible canal hematoma. Disc levels: Cervical spondylosis with multilevel disc space narrowing, disc bulges, posterior disc osteophytes, uncovertebral hypertrophy and facet arthrosis. Disc space narrowing is moderate/advanced at C4-C5 and C6-C7. Additionally, small central disc protrusions are present at C2-C3 and C3-C4. Facet joint ankylosis on the left at C5-C6. Facet joint ankylosis is also suspected bilaterally at T3-T4. Upper chest: No consolidation within the imaged lung apices. No visible pneumothorax. IMPRESSION: 1. No evidence of acute fracture to the cervical spine. 2. Age-indeterminate T2 and T3 vertebral compression deformities, as described. 3. Mild C4-C5 grade 1 retrolisthesis. 4. Mild C7-T1 and T2-T3 grade 1 anterolisthesis. 5. Cervical spondylosis, as described. 6. Levels of facet joint ankylosis within the cervical and visualized upper thoracic spine. Electronically Signed   By: Kellie Simmering DO   On: 01/27/2021 17:54   CT Abdomen Pelvis W Contrast  Result Date: 01/27/2021 CLINICAL DATA:  [place] from standing EXAM: CT ABDOMEN AND PELVIS WITH CONTRAST TECHNIQUE: Multidetector CT imaging of the abdomen and pelvis was performed using the standard protocol following bolus administration of intravenous contrast. CONTRAST:  164mL OMNIPAQUE IOHEXOL 300 MG/ML  SOLN COMPARISON:  None. FINDINGS: Lower chest: No acute pleural or parenchymal lung disease. Hepatobiliary: No focal liver abnormality is seen. Status post cholecystectomy. No biliary dilatation. Pancreas: Unremarkable. No pancreatic ductal dilatation or surrounding inflammatory  changes. Spleen: Normal in size without focal abnormality. Adrenals/Urinary Tract: Adrenal glands are unremarkable. Kidneys are normal, without renal calculi, focal lesion, or hydronephrosis. Bladder is unremarkable. Stomach/Bowel: No bowel obstruction or ileus. No bowel wall thickening or inflammatory change. Vascular/Lymphatic: There is ectasia of the thoracoabdominal aorta without aneurysm. Diffuse atherosclerosis. No pathologic adenopathy. Surgical clips along the left pelvic sidewall and retroperitoneum consistent with lymphadenectomy. Reproductive: Status post hysterectomy. No adnexal masses. Other: No free fluid or free gas. Midline supraumbilical ventral hernia contains a portion of the transverse colon, without bowel obstruction or strangulation. There is a small fat containing umbilical hernia, as well as bilateral fat containing inguinal hernias. Musculoskeletal: There is a chronic L1 compression deformity with vertebra plana and evidence of prior vertebral augmentation. Chronic T11 compression deformity also noted, approximately 50% loss of height. No acute displaced fractures. Reconstructed images demonstrate no additional findings. IMPRESSION: 1. Midline supraumbilical ventral hernia contains a portion of the transverse colon, without bowel obstruction or strangulation. 2. Small fat containing umbilical and bilateral inguinal hernias. 3. Chronic T11 and L1 compression fractures as above. Electronically Signed   By: Randa Ngo M.D.   On: 01/27/2021 20:37   DG Pelvis Portable  Result Date: 01/27/2021 CLINICAL DATA:  [place] EXAM: PORTABLE PELVIS 1-2 VIEWS COMPARISON:  None. FINDINGS: Supine frontal view of the pelvis was obtained, limited by positioning and collimation. The left hemipelvis and left hip are excluded by collimation. I do not see any acute fracture within the visualized  portions of the pelvis or right hip. Soft tissues are unremarkable. IMPRESSION: 1. No acute pelvic fracture on this  limited evaluation. Electronically Signed   By: Randa Ngo M.D.   On: 01/27/2021 16:22   DG Chest Port 1 View  Result Date: 01/27/2021 CLINICAL DATA:  87 year old female with fall. EXAM: PORTABLE CHEST 1 VIEW COMPARISON:  Chest radiograph dated 06/07/2010. FINDINGS: No focal consolidation, pleural effusion or pneumothorax. Stable cardiac silhouette. Atherosclerotic calcification of the aorta. Osteopenia with degenerative changes of the spine. Old right posterior rib fractures. Left humeral neck fracture better seen on the upper extremity radiograph. Partially visualized left upper extremity hardware. IMPRESSION: 1. No active cardiopulmonary disease. 2. Fracture of the left humeral neck. Electronically Signed   By: Anner Crete M.D.   On: 01/27/2021 16:16   DG Knee Right Port  Result Date: 01/27/2021 CLINICAL DATA:  [place] EXAM: PORTABLE RIGHT KNEE - 1-2 VIEW COMPARISON:  None. FINDINGS: Frontal and lateral views of the right knee are obtained. There is a comminuted oblique fracture of the distal right femur, with approximately 15 mm of ventral displacement of the distal fracture fragment. The fracture line extends to the trochlear groove of the patellofemoral articulation, consistent with intra-articular fracture. There is a large joint effusion. Diffuse soft tissue edema. IMPRESSION: 1. Intra-articular comminuted fracture of the distal right femur, with 15 mm of ventral displacement of the distal fracture fragment. 2. Large joint effusion. Electronically Signed   By: Randa Ngo M.D.   On: 01/27/2021 16:20   DG Humerus Left  Result Date: 01/27/2021 CLINICAL DATA:  87 year old female with fall. EXAM: LEFT HUMERUS - 2+ VIEW COMPARISON:  Chest radiograph dated 01/27/2021. FINDINGS: There is a mildly displaced fracture humeral neck of the left with approximately 4 mm medial displacement of the distal fracture fragment. There is fracture of the distal third of the left humeral diaphysis with  displacement of the distal fracture fragment and internal fixation hardware. There is advanced osteopenia. The soft tissues are grossly unremarkable. IMPRESSION: 1. Mildly displaced fracture of the left humeral neck. 2. Displaced fracture of the distal humeral diaphysis with displacement of the internal fixation hardware. Electronically Signed   By: Anner Crete M.D.   On: 01/27/2021 16:20   DG Femur Min 2 Views Right  Result Date: 01/27/2021 CLINICAL DATA:  Distal right femur pain and deformity post fall EXAM: RIGHT FEMUR 2 VIEWS COMPARISON:  None. FINDINGS: There is a comminuted oblique fracture of the distal femoral metadiaphysis with approximately 1 shaft with of medial displacement and impaction of the distal fracture fragment. IMPRESSION: Comminuted displaced oblique fracture of the distal femoral metadiaphysis. Electronically Signed   By: Dahlia Bailiff MD   On: 01/27/2021 18:09    Anti-infectives: Anti-infectives (From admission, onward)   Start     Dose/Rate Route Frequency Ordered Stop   01/27/21 2045  cefTRIAXone (ROCEPHIN) 1 g in sodium chloride 0.9 % 100 mL IVPB        1 g 200 mL/hr over 30 Minutes Intravenous Every 24 hours 01/27/21 2042         Assessment/Plan 87 yo female presenting after a ground-level fall with left humerus fracture and right femur fracture. Had a transient episode of hypotension after initial workup that resolved with a fluid bolus. FAST negative. CT abd/pelvis  showed no solid organ injuries, no intraabdominal free fluid, and no evidence of bleeding in the retroperitoneum. - Ortho consulted for extremity fractures - No other injuries identified on workup or on  tertiary survey - Transient hypotension does not seem to be secondary to bleeding. Continue to monitor, appears to be improving  - Trauma will sign off at this time, further management per medical and ortho teams    LOS: 1 day    Norm Parcel , [facility] Surgery 01/28/2021, 8:12  AM Please see Amion for pager number during day hours 7:00am-4:30pm

## 2021-01-29 ENCOUNTER — Encounter (HOSPITAL_COMMUNITY): Payer: Self-pay | Admitting: Orthopedic Surgery

## 2021-01-29 DIAGNOSIS — D62 Acute posthemorrhagic anemia: Secondary | ICD-10-CM

## 2021-01-29 DIAGNOSIS — N390 Urinary tract infection, site not specified: Secondary | ICD-10-CM | POA: Diagnosis not present

## 2021-01-29 DIAGNOSIS — R410 Disorientation, unspecified: Secondary | ICD-10-CM | POA: Diagnosis not present

## 2021-01-29 DIAGNOSIS — S72401A Unspecified fracture of lower end of right femur, initial encounter for closed fracture: Secondary | ICD-10-CM | POA: Diagnosis not present

## 2021-01-29 LAB — BASIC METABOLIC PANEL
Anion gap: 9 (ref 5–15)
BUN: 24 mg/dL — ABNORMAL HIGH (ref 8–23)
CO2: 24 mmol/L (ref 22–32)
Calcium: 8 mg/dL — ABNORMAL LOW (ref 8.9–10.3)
Chloride: 105 mmol/L (ref 98–111)
Creatinine, Ser: 1.37 mg/dL — ABNORMAL HIGH (ref 0.44–1.00)
GFR, Estimated: 37 mL/min — ABNORMAL LOW (ref >60)
Glucose, Bld: 149 mg/dL — ABNORMAL HIGH (ref 70–99)
Potassium: 4.1 mmol/L (ref 3.5–5.1)
Sodium: 138 mmol/L (ref 135–145)

## 2021-01-29 LAB — MAGNESIUM: Magnesium: 1.8 mg/dL (ref 1.7–2.4)

## 2021-01-29 LAB — URINE CULTURE: Culture: >=100000 — AB

## 2021-01-29 LAB — CBC
HCT: 21.3 % — ABNORMAL LOW (ref 36.0–46.0)
Hemoglobin: 6.6 g/dL — CL (ref 12.0–15.0)
MCH: 30 pg (ref 26.0–34.0)
MCHC: 31 g/dL (ref 30.0–36.0)
MCV: 96.8 fL (ref 80.0–100.0)
Platelets: 114 10*3/uL — ABNORMAL LOW (ref 150–400)
RBC: 2.2 MIL/uL — ABNORMAL LOW (ref 3.87–5.11)
RDW: 13.8 % (ref 11.5–15.5)
WBC: 6.9 10*3/uL (ref 4.0–10.5)
nRBC: 0 % (ref 0.0–0.2)

## 2021-01-29 LAB — VITAMIN D 25 HYDROXY (VIT D DEFICIENCY, FRACTURES): Vit D, 25-Hydroxy: 59.08 ng/mL (ref 30–100)

## 2021-01-29 LAB — PREPARE RBC (CROSSMATCH)

## 2021-01-29 LAB — HEMOGLOBIN AND HEMATOCRIT, BLOOD
HCT: 25.6 % — ABNORMAL LOW (ref 36.0–46.0)
Hemoglobin: 8.2 g/dL — ABNORMAL LOW (ref 12.0–15.0)

## 2021-01-29 MED ORDER — SODIUM CHLORIDE 0.9% IV SOLUTION: Freq: Once | INTRAVENOUS | Status: AC

## 2021-01-29 NOTE — Evaluation (Addendum)
Occupational Therapy Evaluation Patient Details Name: [name] MRN: [id number] DOB: 05/05/1931 Today's Date: 01/29/2021    History of Present Illness [name] This is an 87 year old female with history significant for hypertension as well as history of chronic left humeral fracture who presented to the ED after a mechanical fall at home in the setting of hallucinations and confusion.  She was noted to have a right distal acute femoral fracture as well as noted chronic left humeral fracture.  Family members were concerned about UTI which it appears that she has, and therefore, has been started on Rocephin empirically with urine cultures pending. ORIF R femur fx   Clinical Impression   Pt presents with decline in function and safety with ADLs and ADL mobility with impaired strength, balance, endurance, L UE AROM/function (olf elbow fx, chronic humeral fxs) and hx of dementia/cognitive impairments. Pt's son reports that he has been living with her for 4 years now and provides 24/7 care and sup; his wife comes down some weekends to help as well as they live in [place], [place]. Pt was Ind with bathing, dressing and toileting. Son assists with cooking and home mgt, transportation. RN assisted OT with pt. Pt currently required total A +2 for bed mobility/roling  And was unable to attempt sitting EOB due to pain. Pt also max - total A with ADLs/selfcare and toileting at this time. Pt would benefit from acute OT services to address impairments to maximize level of function and safety    Follow Up Recommendations  SNF;Supervision/Assistance - 24 hour    Equipment Recommendations  Other (comment) (TBD at next level of care)    Recommendations for Other Services       Precautions / Restrictions Precautions Precautions: Fall Restrictions Weight Bearing Restrictions: Yes LUE Weight Bearing: Weight bearing as tolerated RLE Weight Bearing: Weight bearing as tolerated Other Position/Activity  Restrictions: old chronic fx L humerus, Per PA, Ainsley Spinner does not need sling for L UE      Mobility Bed Mobility                    Transfers                      Balance                                           ADL either performed or assessed with clinical judgement   ADL Overall ADL's : Needs assistance/impaired Eating/Feeding: Set up;Supervision/ safety;Sitting;Bed level   Grooming: Wash/dry hands;Wash/dry face;Minimal assistance   Upper Body Bathing: Maximal assistance   Lower Body Bathing: Total assistance   Upper Body Dressing : Total assistance   Lower Body Dressing: Total assistance     Toilet Transfer Details (indicate cue type and reason): unable to attempt due to pain Toileting- Clothing Manipulation and Hygiene: Total assistance;Bed level       Functional mobility during ADLs: Total assistance;+2 for physical assistance General ADL Comments: total A +2 for bed mobility rolling  and to lay on side for posterior hygiene and chaing pads. RN assisted OT this session     Vision         Perception     Praxis      Pertinent Vitals/Pain Pain Assessment: Faces Faces Pain Scale: Hurts whole lot Pain Location: during bed mobility/rolling, L UE with  mininal ROM Pain Descriptors / Indicators: Grimacing;Guarding;Moaning Pain Intervention(s): Limited activity within patient's tolerance;Monitored during session;Repositioned;Patient requesting pain meds-RN notified     Hand Dominance Right   Extremity/Trunk Assessment Upper Extremity Assessment Upper Extremity Assessment: Generalized weakness;LUE deficits/detail LUE Deficits / Details: L UE hx of AROM impaired due to old  elbow fx with prosthesis, chronic humeral fx   Lower Extremity Assessment Lower Extremity Assessment: Defer to PT evaluation       Communication Communication Communication: No difficulties   Cognition Arousal/Alertness: Awake/alert Behavior  During Therapy: Flat affect Overall Cognitive Status: History of cognitive impairments - at baseline                                 General Comments: Pt's son Financial planner caregiver) states pt seems a little more confused and forgertful but close to baseline. Pt did not recall that he has been living with he rfor 4 years now   General Comments       Exercises     Shoulder Instructions      Home Living Family/patient expects to be discharged to:: Private residence Living Arrangements: Children Available Help at Discharge: Family;Available 24 hours/day Type of Home: House Home Access: Stairs to enter [employer] of Steps: 7 Entrance Stairs-Rails: Right;Left Home Layout: One level     Bathroom Shower/Tub: Teacher, early years/pre: Handicapped height     Home Equipment: Environmental consultant - 4 wheels;Cane - single point;Tub bench;Grab bars - tub/shower;Hand held shower head;Grab bars - toilet;Bedside commode   Additional Comments: Pt's son reports that he has been living with her for 4 years now and provides 24/7 care and sup; his wife comes down some weekends to help as well as they live in [place], [place]      Prior Functioning/Environment Level of Independence: Independent with assistive device(s)  Gait / Transfers Assistance Needed: uses rollater normally, uses cane to get through narrow bathroom door ADL's / Homemaking Assistance Needed: Ind with bathing, dressing and toileting. Son assists with cooking and home mgt, transportation   Comments: pt uses tub bench and gets into shower herself per son        OT Problem List: Decreased strength;Impaired balance (sitting and/or standing);Decreased cognition;Decreased knowledge of precautions;Pain;Decreased safety awareness;Decreased activity tolerance;Decreased range of motion;Decreased coordination;Decreased knowledge of use of DME or AE      OT Treatment/Interventions: Self-care/ADL training;DME and/or AE  instruction;Therapeutic activities;Therapeutic exercise;Patient/family education;Balance training    OT Goals(Current goals can be found in the care plan section) Acute Rehab OT Goals Patient Stated Goal: none sated by pt, son would like her to go to rehab OT Goal Formulation: With patient Time For Goal Achievement: 02/12/21 Potential to Achieve Goals: Good ADL Goals Pt Will Perform Grooming: with mod assist;with min assist;sitting Pt Will Perform Upper Body Bathing: with mod assist;sitting Pt Will Perform Upper Body Dressing: with mod assist;sitting Pt Will Transfer to Toilet: with max assist;with mod assist;stand pivot transfer;bedside commode Additional ADL Goal #1: Pt will complete sup - sit max - mod A +2 to sit EOB in prep for grooming and UB ADL tasks  OT Frequency: Min 2X/week   Barriers to D/C:            Co-evaluation              AM-PAC OT "6 Clicks" Daily Activity     Outcome Measure Help from another person eating meals?: A Little Help  from another person taking care of personal grooming?: A Little Help from another person toileting, which includes using toliet, bedpan, or urinal?: Total Help from another person bathing (including washing, rinsing, drying)?: Total Help from another person to put on and taking off regular upper body clothing?: Total Help from another person to put on and taking off regular lower body clothing?: Total 6 Click Score: 10   End of Session    Activity Tolerance: Patient limited by pain Patient left: in bed;with call bell/phone within reach;with nursing/sitter in room;with family/visitor present  OT Visit Diagnosis: Other abnormalities of gait and mobility (R26.89);Muscle weakness (generalized) (M62.81);History of falling (Z91.81);Other symptoms and signs involving cognitive function;Pain Pain - part of body: Arm;Leg;Hip (L UE, R LE)                Time: 1610-9604 OT Time Calculation (min): 39 min Charges:  OT General Charges $OT  Visit: 1 Visit OT Evaluation $OT Eval Moderate Complexity: 1 Mod OT Treatments $Therapeutic Activity: 8-22 mins    Britt Bottom 01/29/2021, 3:59 PM

## 2021-01-29 NOTE — Progress Notes (Signed)
PT Cancellation Note  Patient Details Name: [name] MRN: [id number] DOB: 03-Oct-1931   Cancelled Treatment:    Reason Eval/Treat Not Completed: Pain limiting ability to participate. Pt refusing PT multiple times this afternoon due to pain. RN reports patient is unable to have IV pain medicine at this time due to hypotension. PT will attempt to follow up tomorrow.   Zenaida Niece 01/29/2021, 4:56 PM

## 2021-01-29 NOTE — Progress Notes (Signed)
PROGRESS NOTE    ZADIA UHDE   HFW:[id number]  DOB: 20-Oct-1931  DOA: 01/27/2021 PCP: Prince Solian, MD   Brief Narrative:  PIETRA ZULUAGA This is an 87 year old female with history significant for hypertension as well as history of chronic left humeral fracture who presented to the ED after a mechanical fall at home in the setting of hallucinations and confusion.  She was noted to have a right distal acute femoral fracture as well as noted chronic left humeral fracture.  Family members were concerned about UTI which it appears that she has, and therefore, has been started on Rocephin empirically with urine cultures pending.    Subjective: She has no complaints    Assessment & Plan:   Principal Problem:   Closed fracture of right distal femur - s/p repair on 01/28/21 -Continue plan per orthopedic surgery -Although the patient states that she would like to go home, the patient's son states that he is unable to care for her in this condition and prefers she goes to skilled nursing until she is more mobile  Active Problems: Acute blood loss anemia -Secondary to above-mentioned fracture -Hemoglobin dropped from 9.8-6.6 this morning-1 unit of packed red blood cell transfusion has been ordered  Dementia with acute delirium -Suspected to have delirium secondary to urinary tract infection -According to the son the patient does have dementia at baseline -Continue Depakote and Aricept  Dysphagia?  Hiatal hernia -According to the patient's son, the patient sometimes coughs when drinking thin liquids and coughs in her sleep especially when she is laying flat in the bed -Son believes this is secondary to hiatal hernia-he does state that the coughing is worse when she uses a straw to drink liquids and therefore I do suspect she has some aspiration issues-no history of frequent pneumonias however -Have recommended to RN she avoid straws and not be laid flat in the bed  UTI -Continue  ceftriaxone and follow-up cultures  Hypotension with a history of HTN (hypertension) -BP dropped as low as 88/36 this morning-likely secondary to acute blood loss -Holding hydrochlorothiazide and Avapro    Closed fracture of left humerus -Chronic   Time spent in minutes: 35 DVT prophylaxis: We will start Lovenox once hemoglobin stabilizes Code Status: Full code Family Communication: Son at the bedside Level of Care: Level of care: Telemetry Medical Disposition Plan:  Status is: Inpatient  Remains inpatient appropriate because:IV treatments appropriate due to intensity of illness or inability to take PO   Dispo: The patient is from: Home              Anticipated d/c is to: SNF              Anticipated d/c date is: 3 days              Patient currently is not medically stable to d/c.   Difficult to place patient No      Consultants:   Orthopedic surgery Procedures:   Repair of hip fracture Antimicrobials:  Anti-infectives (From admission, onward)   Start     Dose/Rate Route Frequency Ordered Stop   01/28/21 1815  ceFAZolin (ANCEF) IVPB 1 g/50 mL premix        1 g 100 mL/hr over 30 Minutes Intravenous Every 6 hours 01/28/21 1806 01/29/21 1214   01/28/21 1045  ceFAZolin (ANCEF) IVPB 2g/100 mL premix  Status:  Discontinued        2 g 200 mL/hr over 30 Minutes Intravenous On call to  O.R. 01/28/21 1030 01/28/21 1806   01/28/21 1034  ceFAZolin (ANCEF) 2-4 GM/100ML-% IVPB       Note to Pharmacy: Odelia Gage   : cabinet override      01/28/21 1034 01/28/21 2244   01/27/21 2045  cefTRIAXone (ROCEPHIN) 1 g in sodium chloride 0.9 % 100 mL IVPB        1 g 200 mL/hr over 30 Minutes Intravenous Every 24 hours 01/27/21 2042         Objective: Vitals:   01/29/21 0748 01/29/21 0810 01/29/21 0835 01/29/21 1047  BP: (!) 88/36 (!) 100/40 (!) 100/44 (!) 98/40  Pulse: 70 71 67 76  Resp: 14 15 17 16   Temp: (!) 97.3 F (36.3 C) 97.7 F (36.5 C) 97.8 F (36.6 C) 97.8 F  (36.6 C)  TempSrc: Oral Oral Oral Oral  SpO2: 100% 100% 94% 93%  Weight:      Height:        Intake/Output Summary (Last 24 hours) at 01/29/2021 1331 Last data filed at 01/29/2021 1045 Gross per 24 hour  Intake 3653.56 ml  Output 240 ml  Net 3413.56 ml   Filed Weights   01/28/21 1051  Weight: 76.2 kg    Examination: General exam: Appears comfortable  HEENT: PERRLA, oral mucosa moist, no sclera icterus or thrush Respiratory system: Clear to auscultation. Respiratory effort normal. Cardiovascular system: S1 & S2 heard, RRR.   Gastrointestinal system: Abdomen soft, non-tender, nondistended. Normal bowel sounds. Central nervous system: Alert and oriented to person and place. No focal neurological deficits. Extremities: No cyanosis, clubbing or edema Skin: No rashes or ulcers Psychiatry:  Mood & affect appropriate.     Data Reviewed: I have personally reviewed following labs and imaging studies  CBC: Recent Labs  Lab 01/27/21 1710 01/27/21 2209 01/28/21 0752 01/29/21 0241  WBC 16.9*  --  7.8 6.9  NEUTROABS 14.1*  --   --   --   HGB 11.4* 9.8* 8.8* 6.6*  HCT 37.3 31.0* 27.9* 21.3*  MCV 97.1  --  96.5 96.8  PLT 208  --  157 619*   Basic Metabolic Panel: Recent Labs  Lab 01/27/21 1710 01/28/21 0752 01/29/21 0241  NA 139 138 138  K 4.1 4.0 4.1  CL 102 103 105  CO2 24 25 24   GLUCOSE 153* 112* 149*  BUN 20 20 24*  CREATININE 1.01* 1.02* 1.37*  CALCIUM 8.9 8.3* 8.0*  MG  --   --  1.8   GFR: Estimated Creatinine Clearance: 27 mL/min (A) (by C-G formula based on SCr of 1.37 mg/dL (H)). Liver Function Tests: Recent Labs  Lab 01/27/21 1710  AST 23  ALT 21  ALKPHOS 50  BILITOT 0.7  PROT 6.1*  ALBUMIN 3.3*   No results for input(s): LIPASE, AMYLASE in the last 168 hours. No results for input(s): AMMONIA in the last 168 hours. Coagulation Profile: Recent Labs  Lab 01/27/21 1710  INR 1.1   Cardiac Enzymes: No results for input(s): CKTOTAL, CKMB,  CKMBINDEX, TROPONINI in the last 168 hours. BNP (last 3 results) No results for input(s): PROBNP in the last 8760 hours. HbA1C: No results for input(s): HGBA1C in the last 72 hours. CBG: No results for input(s): GLUCAP in the last 168 hours. Lipid Profile: No results for input(s): CHOL, HDL, LDLCALC, TRIG, CHOLHDL, LDLDIRECT in the last 72 hours. Thyroid Function Tests: No results for input(s): TSH, T4TOTAL, FREET4, T3FREE, THYROIDAB in the last 72 hours. Anemia Panel: No results for input(s): VITAMINB12,  FOLATE, FERRITIN, TIBC, IRON, RETICCTPCT in the last 72 hours. Urine analysis:    Component Value Date/Time   COLORURINE AMBER (A) 01/27/2021 1954   APPEARANCEUR HAZY (A) 01/27/2021 1954   LABSPEC 1.026 01/27/2021 1954   PHURINE 5.0 01/27/2021 1954   GLUCOSEU NEGATIVE 01/27/2021 1954   HGBUR MODERATE (A) 01/27/2021 1954   BILIRUBINUR NEGATIVE 01/27/2021 1954   KETONESUR 5 (A) 01/27/2021 1954   PROTEINUR 30 (A) 01/27/2021 1954   UROBILINOGEN 0.2 09/18/2015 1737   NITRITE NEGATIVE 01/27/2021 1954   LEUKOCYTESUR MODERATE (A) 01/27/2021 1954   Sepsis Labs: @LABRCNTIP (procalcitonin:4,lacticidven:4) ) Recent Results (from the past 240 hour(s))  SARS CORONAVIRUS 2 (TAT 6-24 HRS) Nasopharyngeal Nasopharyngeal Swab     Status: None   Collection Time: 01/27/21  7:47 PM   Specimen: Nasopharyngeal Swab  Result Value Ref Range Status   SARS Coronavirus 2 NEGATIVE NEGATIVE Final    Comment: (NOTE) SARS-CoV-2 target nucleic acids are NOT DETECTED.  The SARS-CoV-2 RNA is generally detectable in upper and lower respiratory specimens during the acute phase of infection. Negative results do not preclude SARS-CoV-2 infection, do not rule out co-infections with other pathogens, and should not be used as the sole basis for treatment or other patient management decisions. Negative results must be combined with clinical observations, patient history, and epidemiological information. The  expected result is Negative.  Fact Sheet for Patients: SugarRoll.be  Fact Sheet for Healthcare Providers: https://www.woods-mathews.com/  This test is not yet approved or cleared by the [place] FDA and  has been authorized for detection and/or diagnosis of SARS-CoV-2 by FDA under an Emergency Use Authorization (EUA). This EUA will remain  in effect (meaning this test can be used) for the duration of the COVID-19 declaration under Se ction 564(b)(1) of the Act, 21 U.S.C. section 360bbb-3(b)(1), unless the authorization is terminated or revoked sooner.  Performed at [facility], [place] [address], [place], [place] [address]   Urine culture     Status: Abnormal   Collection Time: 01/27/21  7:54 PM   Specimen: Urine, Random  Result Value Ref Range Status   Specimen Description URINE, RANDOM  Final   Special Requests   Final    NONE Performed at [facility], [place] [address], [place], [place] [address]    Culture (A)  Final    >=100,000 COLONIES/mL MULTIPLE SPECIES PRESENT, SUGGEST RECOLLECTION   Report Status 01/29/2021 FINAL  Final         Radiology Studies: CT Head Wo Contrast  Result Date: 01/27/2021 CLINICAL DATA:  Head trauma, minor. Additional history provided: Fall, neck pain. EXAM: CT HEAD WITHOUT CONTRAST TECHNIQUE: Contiguous axial images were obtained from the base of the skull through the vertex without intravenous contrast. COMPARISON:  No pertinent prior exams available for comparison. FINDINGS: Brain: Mild cerebral atrophy. There is no acute intracranial hemorrhage. No demarcated cortical infarct. No extra-axial fluid collection. No evidence of intracranial mass. No midline shift. Vascular: No hyperdense vessel.  Atherosclerotic calcifications. Skull: Normal. Negative for fracture or focal lesion. Sinuses/Orbits: Visualized orbits show no acute finding. No significant paranasal sinus disease. IMPRESSION: No  evidence of acute intracranial abnormality. Mild cerebral atrophy. Electronically Signed   By: Kellie Simmering DO   On: 01/27/2021 17:40   CT Cervical Spine Wo Contrast  Result Date: 01/27/2021 CLINICAL DATA:  Neck trauma.  Fall/neck pain. EXAM: CT CERVICAL SPINE WITHOUT CONTRAST TECHNIQUE: Multidetector CT imaging of the cervical spine was performed without intravenous contrast. Multiplanar CT image  reconstructions were also generated. COMPARISON:  Chest radiographs 01/07/2010. FINDINGS: Alignment: Trace C4-C5 grade 1 retrolisthesis. 2 mm C7-T1 grade 1 anterolisthesis. Trace T2-T3 grade 1 anterolisthesis. Skull base and vertebrae: The basion-dental and atlanto-dental intervals are maintained.No evidence of acute fracture to the cervical spine. Mild age-indeterminate T2 superior endplate compression deformity with superimposed Schmorl node. Age-indeterminate T3 vertebral compression deformity (30-40% height loss). Soft tissues and spinal canal: No prevertebral fluid or swelling. No visible canal hematoma. Disc levels: Cervical spondylosis with multilevel disc space narrowing, disc bulges, posterior disc osteophytes, uncovertebral hypertrophy and facet arthrosis. Disc space narrowing is moderate/advanced at C4-C5 and C6-C7. Additionally, small central disc protrusions are present at C2-C3 and C3-C4. Facet joint ankylosis on the left at C5-C6. Facet joint ankylosis is also suspected bilaterally at T3-T4. Upper chest: No consolidation within the imaged lung apices. No visible pneumothorax. IMPRESSION: 1. No evidence of acute fracture to the cervical spine. 2. Age-indeterminate T2 and T3 vertebral compression deformities, as described. 3. Mild C4-C5 grade 1 retrolisthesis. 4. Mild C7-T1 and T2-T3 grade 1 anterolisthesis. 5. Cervical spondylosis, as described. 6. Levels of facet joint ankylosis within the cervical and visualized upper thoracic spine. Electronically Signed   By: Kellie Simmering DO   On: 01/27/2021 17:54    CT Abdomen Pelvis W Contrast  Result Date: 01/27/2021 CLINICAL DATA:  [place] from standing EXAM: CT ABDOMEN AND PELVIS WITH CONTRAST TECHNIQUE: Multidetector CT imaging of the abdomen and pelvis was performed using the standard protocol following bolus administration of intravenous contrast. CONTRAST:  132mL OMNIPAQUE IOHEXOL 300 MG/ML  SOLN COMPARISON:  None. FINDINGS: Lower chest: No acute pleural or parenchymal lung disease. Hepatobiliary: No focal liver abnormality is seen. Status post cholecystectomy. No biliary dilatation. Pancreas: Unremarkable. No pancreatic ductal dilatation or surrounding inflammatory changes. Spleen: Normal in size without focal abnormality. Adrenals/Urinary Tract: Adrenal glands are unremarkable. Kidneys are normal, without renal calculi, focal lesion, or hydronephrosis. Bladder is unremarkable. Stomach/Bowel: No bowel obstruction or ileus. No bowel wall thickening or inflammatory change. Vascular/Lymphatic: There is ectasia of the thoracoabdominal aorta without aneurysm. Diffuse atherosclerosis. No pathologic adenopathy. Surgical clips along the left pelvic sidewall and retroperitoneum consistent with lymphadenectomy. Reproductive: Status post hysterectomy. No adnexal masses. Other: No free fluid or free gas. Midline supraumbilical ventral hernia contains a portion of the transverse colon, without bowel obstruction or strangulation. There is a small fat containing umbilical hernia, as well as bilateral fat containing inguinal hernias. Musculoskeletal: There is a chronic L1 compression deformity with vertebra plana and evidence of prior vertebral augmentation. Chronic T11 compression deformity also noted, approximately 50% loss of height. No acute displaced fractures. Reconstructed images demonstrate no additional findings. IMPRESSION: 1. Midline supraumbilical ventral hernia contains a portion of the transverse colon, without bowel obstruction or strangulation. 2. Small fat  containing umbilical and bilateral inguinal hernias. 3. Chronic T11 and L1 compression fractures as above. Electronically Signed   By: Randa Ngo M.D.   On: 01/27/2021 20:37   DG Pelvis Portable  Result Date: 01/27/2021 CLINICAL DATA:  [place] EXAM: PORTABLE PELVIS 1-2 VIEWS COMPARISON:  None. FINDINGS: Supine frontal view of the pelvis was obtained, limited by positioning and collimation. The left hemipelvis and left hip are excluded by collimation. I do not see any acute fracture within the visualized portions of the pelvis or right hip. Soft tissues are unremarkable. IMPRESSION: 1. No acute pelvic fracture on this limited evaluation. Electronically Signed   By: Randa Ngo M.D.   On: 01/27/2021 16:22   DG  Chest Port 1 View  Result Date: 01/27/2021 CLINICAL DATA:  87 year old female with fall. EXAM: PORTABLE CHEST 1 VIEW COMPARISON:  Chest radiograph dated 06/07/2010. FINDINGS: No focal consolidation, pleural effusion or pneumothorax. Stable cardiac silhouette. Atherosclerotic calcification of the aorta. Osteopenia with degenerative changes of the spine. Old right posterior rib fractures. Left humeral neck fracture better seen on the upper extremity radiograph. Partially visualized left upper extremity hardware. IMPRESSION: 1. No active cardiopulmonary disease. 2. Fracture of the left humeral neck. Electronically Signed   By: Anner Crete M.D.   On: 01/27/2021 16:16   DG Knee Right Port  Result Date: 01/27/2021 CLINICAL DATA:  [place] EXAM: PORTABLE RIGHT KNEE - 1-2 VIEW COMPARISON:  None. FINDINGS: Frontal and lateral views of the right knee are obtained. There is a comminuted oblique fracture of the distal right femur, with approximately 15 mm of ventral displacement of the distal fracture fragment. The fracture line extends to the trochlear groove of the patellofemoral articulation, consistent with intra-articular fracture. There is a large joint effusion. Diffuse soft tissue edema. IMPRESSION: 1.  Intra-articular comminuted fracture of the distal right femur, with 15 mm of ventral displacement of the distal fracture fragment. 2. Large joint effusion. Electronically Signed   By: Randa Ngo M.D.   On: 01/27/2021 16:20   DG Humerus Left  Result Date: 01/27/2021 CLINICAL DATA:  87 year old female with fall. EXAM: LEFT HUMERUS - 2+ VIEW COMPARISON:  Chest radiograph dated 01/27/2021. FINDINGS: There is a mildly displaced fracture humeral neck of the left with approximately 4 mm medial displacement of the distal fracture fragment. There is fracture of the distal third of the left humeral diaphysis with displacement of the distal fracture fragment and internal fixation hardware. There is advanced osteopenia. The soft tissues are grossly unremarkable. IMPRESSION: 1. Mildly displaced fracture of the left humeral neck. 2. Displaced fracture of the distal humeral diaphysis with displacement of the internal fixation hardware. Electronically Signed   By: Anner Crete M.D.   On: 01/27/2021 16:20   DG C-Arm 1-60 Min  Result Date: 01/28/2021 CLINICAL DATA:  Trauma.  ORIF of right distal femoral fracture. EXAM: RIGHT FEMUR 2 VIEWS; DG C-ARM 1-60 MIN COMPARISON:  January 27, 2021. FINDINGS: Fluoro time: 1 minutes 27 seconds. Reported radiation: 10.68 mGy. Nine C-arm fluoroscopic images were obtained intraoperatively and submitted for post operative interpretation. These images demonstrate surgical changes associated with intramedullary rod and screw fixation of a distal femoral metadiaphyseal fracture. Final images demonstrate improved, near anatomic alignment. Please see the performing provider's procedural report for further detail. IMPRESSION: Intraoperative fluoroscopic imaging, as detailed above. Electronically Signed   By: Margaretha Sheffield MD   On: 01/28/2021 13:57   DG FEMUR, MIN 2 VIEWS RIGHT  Result Date: 01/28/2021 CLINICAL DATA:  Trauma.  ORIF of right distal femoral fracture. EXAM: RIGHT FEMUR 2  VIEWS; DG C-ARM 1-60 MIN COMPARISON:  January 27, 2021. FINDINGS: Fluoro time: 1 minutes 27 seconds. Reported radiation: 10.68 mGy. Nine C-arm fluoroscopic images were obtained intraoperatively and submitted for post operative interpretation. These images demonstrate surgical changes associated with intramedullary rod and screw fixation of a distal femoral metadiaphyseal fracture. Final images demonstrate improved, near anatomic alignment. Please see the performing provider's procedural report for further detail. IMPRESSION: Intraoperative fluoroscopic imaging, as detailed above. Electronically Signed   By: Margaretha Sheffield MD   On: 01/28/2021 13:57   DG Femur Min 2 Views Right  Result Date: 01/27/2021 CLINICAL DATA:  Distal right femur pain and deformity post  fall EXAM: RIGHT FEMUR 2 VIEWS COMPARISON:  None. FINDINGS: There is a comminuted oblique fracture of the distal femoral metadiaphysis with approximately 1 shaft with of medial displacement and impaction of the distal fracture fragment. IMPRESSION: Comminuted displaced oblique fracture of the distal femoral metadiaphysis. Electronically Signed   By: Dahlia Bailiff MD   On: 01/27/2021 18:09   DG FEMUR PORT, MIN 2 VIEWS RIGHT  Result Date: 01/28/2021 CLINICAL DATA:  Status post ORIF of distal right femoral fracture EXAM: RIGHT FEMUR PORTABLE 2 VIEW COMPARISON:  Intraoperative films from earlier in the same day. FINDINGS: Medullary rod is noted with proximal and distal fixation screws. Fracture fragments are in near anatomic alignment. No soft tissue abnormality is seen. IMPRESSION: Status post ORIF of mid to distal right femoral fracture. Electronically Signed   By: Inez Catalina M.D.   On: 01/28/2021 16:45      Scheduled Meds: . acetaminophen  650 mg Oral Q8H  . vitamin C  500 mg Oral Daily  . cholecalciferol  2,000 Units Oral BID  . divalproex  125 mg Oral BID  . docusate sodium  100 mg Oral BID  . donepezil  10 mg Oral QHS  . feeding  supplement  237 mL Oral BID BM  . ferrous sulfate  325 mg Oral Daily  . levothyroxine  125 mcg Oral QAC breakfast  . loratadine  10 mg Oral Daily  . multivitamin with minerals  1 tablet Oral QHS  . oyster calcium  1,250 mg Oral QHS  . pantoprazole  40 mg Oral Daily  . rosuvastatin  20 mg Oral QHS   Continuous Infusions: . cefTRIAXone (ROCEPHIN)  IV 1 g (01/28/21 2258)  . lactated ringers 100 mL/hr at 01/29/21 1048     LOS: 2 days      Debbe Odea, MD [facility] Pager: [email or web address] 01/29/2021, 1:31 PM

## 2021-01-29 NOTE — Progress Notes (Signed)
Orthopaedic Trauma Service Progress Note  Patient ID: [name] MRN: [id number] DOB/AGE: Jun 07, 1931 87 y.o.  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 Has been managing for years with it Able to use L arm for basic ADL's. Does not push or pull with left arm (due to nonunion and restrictions from TEA)  Only has son at home to help. Son's wife does come down on the weekends to help    ROS As above  Objective:   VITALS:   Vitals:   01/29/21 0546 01/29/21 0748 01/29/21 0810 01/29/21 0835  BP: (!) 101/44 (!) 88/36 (!) 100/40 (!) 100/44  Pulse: 69 70 71 67  Resp: 18 14 15 17   Temp: (!) 97.5 F (36.4 C) (!) 97.3 F (36.3 C) 97.7 F (36.5 C) 97.8 F (36.6 C)  TempSrc: Oral Oral Oral Oral  SpO2: 100% 100% 100% 94%  Weight:      Height:        Estimated body mass index is 31.74 kg/m as calculated from the following:   Height as of this encounter: 5\' 1"  (1.549 m).   Weight as of this encounter: 76.2 kg.   Intake/Output      02/08 0701 02/09 0700 02/09 0701 02/10 0700   I.V. (mL/kg) 2715.6 (35.6) 431.9 (5.7)   IV Piggyback 400 50   Total Intake(mL/kg) 3115.6 (40.9) 481.9 (6.3)   Urine (mL/kg/hr) 360 (0.2)    Blood 50    Total Output 410    Net +2705.6 +481.9          LABS  Results for orders placed or performed during the hospital encounter of 01/27/21 (from the past 24 hour(s))  Basic metabolic panel     Status: Abnormal   Collection Time: 01/29/21  2:41 AM  Result Value Ref Range   Sodium 138 135 - 145 mmol/L   Potassium 4.1 3.5 - 5.1 mmol/L   Chloride 105 98 - 111 mmol/L   CO2 24 22 - 32 mmol/L   Glucose, Bld 149 (H) 70 - 99 mg/dL   BUN 24 (H) 8 - 23 mg/dL   Creatinine, Ser 1.37 (H) 0.44 - 1.00 mg/dL   Calcium 8.0 (L)  8.9 - 10.3 mg/dL   GFR, Estimated 37 (L) >60 mL/min   Anion gap 9 5 - 15  Magnesium     Status: None   Collection Time: 01/29/21  2:41 AM  Result Value Ref Range   Magnesium 1.8 1.7 - 2.4 mg/dL  CBC     Status: Abnormal   Collection Time: 01/29/21  2:41 AM  Result Value Ref Range   WBC 6.9 4.0 - 10.5 K/uL   RBC 2.20 (L) 3.87 - 5.11 MIL/uL   Hemoglobin 6.6 (LL) 12.0 - 15.0 g/dL   HCT 21.3 (L) 36.0 - 46.0 %   MCV 96.8 80.0 - 100.0 fL   MCH 30.0 26.0 - 34.0 pg   MCHC 31.0 30.0 - 36.0 g/dL   RDW 13.8 11.5 - 15.5 %   Platelets 114 (L) 150 - 400 K/uL   nRBC 0.0 0.0 - 0.2 %  VITAMIN D 25 Hydroxy (Vit-D  Deficiency, Fractures)     Status: None   Collection Time: 01/29/21  2:41 AM  Result Value Ref Range   Vit D, 25-Hydroxy 59.08 30 - 100 ng/mL  Prepare RBC (crossmatch)     Status: None   Collection Time: 01/29/21  6:43 AM  Result Value Ref Range   Order Confirmation      ORDER PROCESSED BY BLOOD BANK Performed at [facility], [place] [address], [place], [place] [address]      PHYSICAL EXAM:   Gen: sitting up in bed, NAD, appears well  Lungs: unlabored Cardiac:s1 and s2 Ext:       Right Lower Extremity   Dressings stable    Spot drainage but nothing substantial   Ext warm   + DP pulse   DPN, SPN, TN sensation intact  EHL, FHL, lesser toe motor intact  Ankle flexion, extension, inversion and eversion intact  No DCT     Assessment/Plan: 1 Day Post-Op   Principal Problem:   Closed fracture of right distal femur ([place]) Active Problems:   HTN (hypertension)   Bradycardia   RBBB   Closed fracture of left humerus   Delirium   Anti-infectives (From admission, onward)   Start     Dose/Rate Route Frequency Ordered Stop   01/28/21 1815  ceFAZolin (ANCEF) IVPB 1 g/50 mL premix        1 g 100 mL/hr over 30 Minutes Intravenous Every 6 hours 01/28/21 1806 01/29/21 1214   01/28/21 1045  ceFAZolin (ANCEF) IVPB 2g/100 mL premix  Status:  Discontinued        2 g 200 mL/hr  over 30 Minutes Intravenous On call to O.R. 01/28/21 1030 01/28/21 1806   01/28/21 1034  ceFAZolin (ANCEF) 2-4 GM/100ML-% IVPB       Note to Pharmacy: Odelia Gage   : cabinet override      01/28/21 1034 01/28/21 2244   01/27/21 2045  cefTRIAXone (ROCEPHIN) 1 g in sodium chloride 0.9 % 100 mL IVPB        1 g 200 mL/hr over 30 Minutes Intravenous Every 24 hours 01/27/21 2042      .  POD/HD#: 1  87 y/o female s/p fall with R distal femur fracture   -fall   - R distal femur fracture s/p IMN   WBAT R leg for transfers only x 3 weeks then will likely allow WBAT for ambulation   ROM as tolerated R knee and ankle  Dressing changes starting tomorrow  Ice prn   PT/OT evals   - chronic L humerus fracture/nonunion   Followed by dr. Amedeo Plenty   Pt can use left arm as tolerated   - Pain management:  Doing well with scheduled tylenol  Multimodal   Minimize narcotics  - ABL anemia/Hemodynamics  1 unit PRBCs transfusing  Check H/H this afternoon   Likely related to fracture and surgery   - Medical issues   Per primary   - DVT/PE prophylaxis:  SCDS  lovenox once h/h stabilize   - ID:   periop abx   - Metabolic Bone Disease:  Fragility type fracture  Vitamin d levels look good  Recommend DEXA in 4-8 weeks   - Activity:  WBAT R leg for transfers  Up with assist only   - FEN/GI prophylaxis/Foley/Lines:  Diet as tolerated   - Impediments to fracture healing:  Fragility fracture/poor bone quality/osteoporosis  Thyroid disease     - Dispo:  Therapy evals   Suspect  pt would benefit from SNF     Jari Pigg, PA-C [phone] (C) 01/29/2021, 9:38 AM  [facility] [place] [address] [phone] Jenetta [phone] (F)    After 5pm and on the weekends please log on to Amion, go to orthopaedics and the look under the [facility] Call for the provider(s) on call. You can also call our office at [phone] and then  follow the prompts to be connected to the call team.

## 2021-01-29 NOTE — Plan of Care (Addendum)
Pt completed 1unit PRBC transfusion, no reactions noted, VS recorded, son at bedside updated.  14:20 Foley discontinued per Dr. Wynelle Cleveland.  Problem: Health Behavior/Discharge Planning: Goal: Ability to manage health-related needs will improve 01/29/2021 0822 by Williams Che, RN Outcome: Progressing 01/28/2021 1917 by Williams Che, RN Outcome: Progressing   Problem: Clinical Measurements: Goal: Ability to maintain clinical measurements within normal limits will improve 01/29/2021 0822 by Williams Che, RN Outcome: Progressing 01/28/2021 1917 by Williams Che, RN Outcome: Progressing   Problem: Activity: Goal: Risk for activity intolerance will decrease Outcome: Progressing   Problem: Nutrition: Goal: Adequate nutrition will be maintained 01/29/2021 8110 by Williams Che, RN Outcome: Progressing 01/28/2021 1917 by Williams Che, RN Outcome: Progressing   Problem: Coping: Goal: Level of anxiety will decrease Outcome: Progressing   Problem: Elimination: Goal: Will not experience complications related to bowel motility 01/29/2021 0822 by Williams Che, RN Outcome: Progressing 01/28/2021 1917 by Williams Che, RN Outcome: Progressing   Problem: Pain Managment: Goal: General experience of comfort will improve 01/29/2021 0822 by Williams Che, RN Outcome: Progressing 01/28/2021 1917 by Williams Che, RN Outcome: Progressing   Problem: Safety: Goal: Ability to remain free from injury will improve 01/29/2021 0822 by Williams Che, RN Outcome: Progressing 01/28/2021 1917 by Williams Che, RN Outcome: Progressing   Problem: Skin Integrity: Goal: Risk for impaired skin integrity will decrease 01/29/2021 0822 by Williams Che, RN Outcome: Progressing 01/28/2021 1917 by Williams Che, RN Outcome: Progressing

## 2021-01-29 NOTE — Progress Notes (Signed)
Transition of Care [facility]) - CAGE-AID Screening   Patient Details  Name: [name] MRN: [id number] Date of Birth: Jul 18, 1931   Verdie Shire, RN Phone Number: 01/29/2021, 3:20 PM   Clinical Narrative: Pt has history of dementia.    CAGE-AID Screening:

## 2021-01-29 NOTE — Plan of Care (Signed)

## 2021-01-29 NOTE — Progress Notes (Signed)
CRITICAL VALUE ALERT  Critical Value:  Hgb 6.6  Date & Time Notied:  01/29/21 @ 0515  Provider Notified: yes ([facility]. Paged)  Orders Received/Actions taken: waiting for it

## 2021-01-29 NOTE — Progress Notes (Signed)
Nutrition Follow-up  DOCUMENTATION CODES:   Obesity unspecified  INTERVENTION:   -Continue Ensure Enlive po BID, each supplement provides 350 kcal and 20 grams of protein -Continue MVI with minerals daily  NUTRITION DIAGNOSIS:   Increased nutrient needs related to post-op healing as evidenced by estimated needs.  Ongoing  GOAL:   Patient will meet greater than or equal to 90% of their needs  Progressing   MONITOR:   PO intake,Supplement acceptance,Diet advancement,Labs,Weight trends,Skin,I & O's  REASON FOR ASSESSMENT:   Consult Assessment of nutrition requirement/status,Hip fracture protocol  ASSESSMENT:   This is an 87 year old female with history significant for hypertension as well as history of chronic left humeral fracture who presented to the ED after a mechanical fall at home in the setting of hallucinations and confusion.  She was noted to have a right distal acute femoral fracture as well as noted chronic left humeral fracture.  Family members were concerned about UTI which it appears that she has, and therefore, has been started on Rocephin empirically with urine cultures pending.  Orthopedics has evaluated patient with plans for ORIF of right distal femur fracture today.  2/8- s/p ORIF of rt distal femur fracture  Reviewed I/O's: +2.7 L x 24 hours and +4.3 L since admission  UOP: 360 ml x 24 hours  Spoke with pt at bedside, who was pleasant and in good spirits today. She reports she does not have much of an appetite today- consumed only a few bites of egg and bagel at breakfast.   PTA pt shares that her appetite is "too good". She generally consumes 3 meals per day (Breakfast: eggs and toast; Lunch and Dinner: meat, starch, and vegetable). She cooks her own meals, but resides at her son.   Pt denies any weight loss.   Discussed importance of good meal and supplement intake to promote healing. Pt willing to try Ensure Enlive supplements.   Medications  reviewed and include vitamin D3, vitamin C, colace, ferrous sulfate, and oyster calcium.   Labs reviewed.   NUTRITION - FOCUSED PHYSICAL EXAM:  Flowsheet Row Most Recent Value  Orbital Region No depletion  Upper Arm Region Mild depletion  Thoracic and Lumbar Region No depletion  Buccal Region No depletion  Temple Region No depletion  Clavicle Bone Region No depletion  Clavicle and Acromion Bone Region No depletion  Scapular Bone Region No depletion  Dorsal Hand No depletion  Patellar Region No depletion  Anterior Thigh Region No depletion  Posterior Calf Region No depletion  Edema (RD Assessment) Mild  Hair Reviewed  Eyes Reviewed  Mouth Reviewed  Skin Reviewed  Nails Reviewed       Diet Order:   Diet Order            Diet Heart Room service appropriate? Yes; Fluid consistency: Thin  Diet effective now                 EDUCATION NEEDS:   No education needs have been identified at this time  Skin:  Skin Assessment: Skin Integrity Issues: Skin Integrity Issues:: Incisions Incisions: closed rt leg  Last BM:  Unknown  Height:   Ht Readings from Last 1 Encounters:  01/28/21 5\' 1"  (1.549 m)    Weight:   Wt Readings from Last 1 Encounters:  01/28/21 76.2 kg    Ideal Body Weight:  47.7 kg  BMI:  Body mass index is 31.74 kg/m.  Estimated Nutritional Needs:   Kcal:  1500-1700  Protein:  80-95  grams  Fluid:  > 1.5 L    Loistine Chance, RD, LDN, [place] Registered Dietitian II Certified Diabetes Care and Education Specialist Please refer to [facility] for RD and/or RD on-call/weekend/after hours pager

## 2021-01-30 DIAGNOSIS — R410 Disorientation, unspecified: Secondary | ICD-10-CM | POA: Diagnosis not present

## 2021-01-30 DIAGNOSIS — S72401A Unspecified fracture of lower end of right femur, initial encounter for closed fracture: Secondary | ICD-10-CM | POA: Diagnosis not present

## 2021-01-30 DIAGNOSIS — N390 Urinary tract infection, site not specified: Secondary | ICD-10-CM | POA: Diagnosis not present

## 2021-01-30 DIAGNOSIS — D62 Acute posthemorrhagic anemia: Secondary | ICD-10-CM | POA: Diagnosis not present

## 2021-01-30 LAB — CBC
HCT: 22.9 % — ABNORMAL LOW (ref 36.0–46.0)
Hemoglobin: 7.9 g/dL — ABNORMAL LOW (ref 12.0–15.0)
MCH: 32.1 pg (ref 26.0–34.0)
MCHC: 34.5 g/dL (ref 30.0–36.0)
MCV: 93.1 fL (ref 80.0–100.0)
Platelets: 120 10*3/uL — ABNORMAL LOW (ref 150–400)
RBC: 2.46 MIL/uL — ABNORMAL LOW (ref 3.87–5.11)
RDW: 14.1 % (ref 11.5–15.5)
WBC: 7.7 10*3/uL (ref 4.0–10.5)
nRBC: 0 % (ref 0.0–0.2)

## 2021-01-30 LAB — BASIC METABOLIC PANEL
Anion gap: 10 (ref 5–15)
BUN: 33 mg/dL — ABNORMAL HIGH (ref 8–23)
CO2: 23 mmol/L (ref 22–32)
Calcium: 7.9 mg/dL — ABNORMAL LOW (ref 8.9–10.3)
Chloride: 107 mmol/L (ref 98–111)
Creatinine, Ser: 1.53 mg/dL — ABNORMAL HIGH (ref 0.44–1.00)
GFR, Estimated: 33 mL/min — ABNORMAL LOW (ref >60)
Glucose, Bld: 130 mg/dL — ABNORMAL HIGH (ref 70–99)
Potassium: 4.2 mmol/L (ref 3.5–5.1)
Sodium: 140 mmol/L (ref 135–145)

## 2021-01-30 LAB — TYPE AND SCREEN
ABO/RH(D): A POS
Antibody Screen: NEGATIVE
Unit division: 0

## 2021-01-30 LAB — BPAM RBC
Blood Product Expiration Date: 202203032359
ISSUE DATE / TIME: 202202090759
Unit Type and Rh: 6200

## 2021-01-30 MED ORDER — QUETIAPINE FUMARATE 25 MG PO TABS
12.5000 mg | ORAL_TABLET | Freq: Every evening | ORAL | Status: DC | PRN
  Administered 2021-01-31: 12.5 mg via ORAL
  Filled 2021-01-30: qty 1

## 2021-01-30 MED ORDER — MORPHINE SULFATE (PF) 2 MG/ML IV SOLN: 0.5000 mg | INTRAVENOUS | Status: DC | PRN

## 2021-01-30 MED ORDER — ACETAMINOPHEN 500 MG PO TABS
1000.0000 mg | ORAL_TABLET | Freq: Three times a day (TID) | ORAL | Status: DC
  Administered 2021-01-30 – 2021-02-02 (×10): 1000 mg via ORAL
  Filled 2021-01-30 (×10): qty 2

## 2021-01-30 MED ORDER — SENNA 8.6 MG PO TABS
1.0000 | ORAL_TABLET | Freq: Every day | ORAL | Status: DC
  Administered 2021-01-30 – 2021-02-02 (×4): 8.6 mg via ORAL
  Filled 2021-01-30 (×4): qty 1

## 2021-01-30 MED ORDER — IBUPROFEN 200 MG PO TABS
200.0000 mg | ORAL_TABLET | Freq: Four times a day (QID) | ORAL | Status: DC
  Administered 2021-01-30 – 2021-02-02 (×14): 200 mg via ORAL
  Filled 2021-01-30 (×14): qty 1

## 2021-01-30 NOTE — Evaluation (Signed)
Physical Therapy Evaluation Patient Details Name: [name] MRN: [id number] DOB: Oct 04, 1931 Today's Date: 01/30/2021   History of Present Illness  Pt is an 87 y.o. female admitted 01/27/21 with AMS who sustained a fall. Imaging revealed R distal femur fx, L humeral head and midshift fx, age indeterminate T2-3 compression deformities. S/p R femur ORIF 2/8. PMH includes HTN, prior L elbow replacement, obesity, osteoporosis, dementia.    Clinical Impression  Pt presents with an overall decrease in functional mobility secondary to above. PTA, pt ambulatory with rollator and performing ADLs with intermittent assist from son; son lives with pt to provide 24/7 supervision and assist with household tasks. Today, pt able to initiate seated EOB activity and BLE therex/ROM, requiring maxA+1-2 for bed mobility; progressing to min guard for seated balance. Pt fatigued this session but agreeable to participate despite pain. Limited by generalized weakness, decreased activity tolerance and RLE post-op pain/weakness. Son present and supportive. Pt would benefit from continued acute PT services to maximize functional mobility and independence prior to d/c with SNF-level therapies.   Recommend use of maximove lift for OOB transfers with nursing staff    Follow Up Recommendations SNF;Supervision for mobility/OOB    Equipment Recommendations  Wheelchair (measurements PT);Wheelchair cushion (measurements PT);Hospital bed (hoyer lift) - if return home   Recommendations for Other Services       Precautions / Restrictions Precautions Precautions: Fall Restrictions Weight Bearing Restrictions: Yes LUE Weight Bearing: Weight bearing as tolerated RLE Weight Bearing: Weight bearing as tolerated Other Position/Activity Restrictions: Unrestricted R knee ROM; chronic L humerus fx, does not need sling (per ortho PA)      Mobility  Bed Mobility Overal bed mobility: Needs Assistance Bed Mobility: Supine to  Sit;Sit to Supine     Supine to sit: Max assist;+2 for physical assistance;HOB elevated Sit to supine: Max assist;+2 for safety/equipment   General bed mobility comments: Pt initiating LE movement to EOB, difficulty assisting with painful L shoulder, requiring maxA to manage BLEs, +2 assist from pt's son helpful for trunk elevation and stability; return to supine with maxA for trunk control and LE management; +2 assist helpful for repositioning and sliding up in bed; pt limited by RLE pain    Transfers                 General transfer comment: Deferred secondary to pain and fatigue after prolonged sitting EOB; will require +2 assist and likely lift equipment  Ambulation/Gait                Stairs            Wheelchair Mobility    Modified Rankin (Stroke Patients Only)       Balance Overall balance assessment: Needs assistance   Sitting balance-Leahy Scale: Fair Sitting balance - Comments: Prolonged sitting EOB, initially with modA progressing to min guard without UE support; stability improved with UE support while performing seated LE ROM                                     Pertinent Vitals/Pain Pain Assessment: Faces Faces Pain Scale: Hurts even more Pain Location: RLE with mobility/ROM; LUE with reaching to bed rail Pain Descriptors / Indicators: Grimacing;Guarding;Moaning Pain Intervention(s): Limited activity within patient's tolerance;Monitored during session;Repositioned;Premedicated before session    Home Living Family/patient expects to be discharged to:: Private residence Living Arrangements: Children Available Help at Discharge: Family;Available 24  hours/day Type of Home: House Home Access: Stairs to enter Entrance Stairs-Rails: Psychiatric nurse of Steps: 7 Home Layout: One level Home Equipment: Walker - 4 wheels;Cane - single point;Tub bench;Grab bars - tub/shower;Hand held shower head;Grab bars -  toilet;Bedside commode Additional Comments: Pt's son reports that he has been living with her for 4 years now and provides 24/7 care and supervision; his wife comes down some weekends to help as well as they live in [place], [place]    Prior Function Level of Independence: Independent with assistive device(s)   Gait / Transfers Assistance Needed: Typically mod indep ambulating with rollator; uses SPC to get through narrow bathroom door  ADL's / Homemaking Assistance Needed: Son reports pt mod indep with bathing (uses tub bench to get into shower; son helps wash backside), dressing, toileting; son assists with household tasks and transportation        Hand Dominance        Extremity/Trunk Assessment   Upper Extremity Assessment Upper Extremity Assessment: Generalized weakness;LUE deficits/detail LUE Deficits / Details: H/o prior L elbow fx with prosthesis, chronic humeral fx; limited shoulder/elbow AROM; bilateral hands with apparent RA ulnar drift deformity    Lower Extremity Assessment Lower Extremity Assessment: RLE deficits/detail;LLE deficits/detail;Difficult to assess due to impaired cognition RLE Deficits / Details: s/p R femur IMN; R hip flex/ext and knee flex/ext functionally <3/5 limited by post-op pain and weakness, RLE Coordination: decreased gross motor;decreased fine motor LLE Deficits / Details: Knee flex/ext at least 3/5, hip functionally <3/5 LLE Coordination: decreased gross motor;decreased fine motor       Communication   Communication: No difficulties  Cognition Arousal/Alertness: Awake/alert Behavior During Therapy: Flat affect Overall Cognitive Status: History of cognitive impairments - at baseline                                 General Comments: Per son, pt near baseline cognition although with some more confusion/fatigue. Pt requiring cues to keep eyes open and engage in conversation/mobility, but more alert once seated upright      General  Comments General comments (skin integrity, edema, etc.): Son present and supportive. Pt received with pulse ox monitor finger probe taped to forehead with SpO2 reading 100% on 1L O2; O2 [place] removed, down to 89% on RA via finger probe, therefore 1L replaced at end of session; finger probe removed from forehead    Exercises General Exercises - Lower Extremity Long Arc Quad: AAROM;Both;Seated Heel Slides: AAROM;Both;Seated   Assessment/Plan    PT Assessment Patient needs continued PT services  PT Problem List Decreased strength;Decreased range of motion;Decreased activity tolerance;Decreased balance;Decreased mobility;Decreased cognition;Decreased knowledge of use of DME;Decreased knowledge of precautions;Pain;Obesity       PT Treatment Interventions DME instruction;Gait training;Functional mobility training;Therapeutic activities;Therapeutic exercise;Balance training;Patient/family education;Wheelchair mobility training    PT Goals (Current goals can be found in the Care Plan section)  Acute Rehab PT Goals Patient Stated Goal: Son agreeable to post-acute rehab at SNF PT Goal Formulation: With family Time For Goal Achievement: 02/13/21 Potential to Achieve Goals: Fair    Frequency Min 3X/week   Barriers to discharge        Co-evaluation               AM-PAC PT "6 Clicks" Mobility  Outcome Measure Help needed turning from your back to your side while in a flat bed without using bedrails?: A Lot Help needed moving from lying  on your back to sitting on the side of a flat bed without using bedrails?: A Lot Help needed moving to and from a bed to a chair (including a wheelchair)?: Total Help needed standing up from a chair using your arms (e.g., wheelchair or bedside chair)?: Total Help needed to walk in hospital room?: Total Help needed climbing 3-5 steps with a railing? : Total 6 Click Score: 8    End of Session   Activity Tolerance: Patient tolerated treatment well;Patient  limited by fatigue;Patient limited by pain Patient left: in bed;with call bell/phone within reach;with bed alarm set;with family/visitor present Nurse Communication: Mobility status;Need for lift equipment PT Visit Diagnosis: Other abnormalities of gait and mobility (R26.89);Muscle weakness (generalized) (M62.81);Pain Pain - Right/Left: Right Pain - part of body: Hip;Leg    Time: 1036-1100 PT Time Calculation (min) (ACUTE ONLY): 24 min   Charges:   PT Evaluation $PT Eval Moderate Complexity: 1 Mod PT Treatments $Therapeutic Activity: 8-22 mins   Mabeline Caras, PT, DPT Acute Rehabilitation Services  Pager [phone] Office [phone]  Derry Lory 01/30/2021, 1:08 PM

## 2021-01-30 NOTE — NC FL2 (Signed)
[place] LEVEL OF CARE SCREENING TOOL     IDENTIFICATION  Patient Name: [name] Birthdate: 04-24-1931 Sex: female Admission Date (Current Location): 01/27/2021  [facility] and [place] Number:  Herbalist and Address:  [facility] [place] [address] [place], [place] [address]      Provider Number: [id number]  Attending Physician Name and Address:  Debbe Odea, MD  Relative Name and Phone Number:  Yazmyn Valbuena [facility]) [phone]    Current Level of Care: Hospital Recommended Level of Care: [place] Prior Approval Number:    Date Approved/Denied:   PASRR Number: [id number]  Discharge Plan: SNF    Current Diagnoses: Patient Active Problem List   Diagnosis Date Noted  . Closed fracture of left humerus 01/27/2021  . Delirium 01/27/2021  . Closed fracture of right distal femur ([place]) 01/27/2021  . UTI (urinary tract infection) 03/02/2013  . Fracture of L1 vertebra ([place]) 02/27/2013  . Chronic back pain greater than 3 months duration 02/27/2013  . RBBB 12/09/2012  . HTN (hypertension) 11/09/2011  . Bradycardia 11/09/2011    Orientation RESPIRATION BLADDER Height & Weight     Self (memory impairment)  Normal Continent Weight: 76.2 kg Height:  5\' 1"  (154.9 cm)  BEHAVIORAL SYMPTOMS/MOOD NEUROLOGICAL BOWEL NUTRITION STATUS      Continent Diet (refer to d/c summary)  AMBULATORY STATUS COMMUNICATION OF NEEDS Skin   Extensive Assist Verbally Surgical wounds (S/p R femur ORIF 2/8)                       Personal Care Assistance Level of Assistance  Bathing,Dressing,Feeding Bathing Assistance: Maximum assistance Feeding assistance: Limited assistance Dressing Assistance: Maximum assistance     Functional Limitations Info  Sight,Hearing,Speech Sight Info: Adequate Hearing Info: Adequate Speech Info: Adequate    SPECIAL CARE FACTORS FREQUENCY  PT (By licensed PT),OT (By licensed OT)     PT  Frequency: 5x/week, evaluate and treat OT Frequency: 5x/week, evaluate and treat            Contractures Contractures Info: Not present    Additional Factors Info  Code Status,Allergies Code Status Info: Full Code Allergies Info: No known drug allergies           Current Medications (01/30/2021):  This is the current hospital active medication list Current Facility-Administered Medications  Medication Dose Route Frequency Provider Last Rate Last Admin  . acetaminophen (TYLENOL) tablet 1,000 mg  1,000 mg Oral TID Debbe Odea, MD   1,000 mg at 01/30/21 1013  . acetaminophen (TYLENOL) tablet 325-650 mg  325-650 mg Oral Q6H PRN Ainsley Spinner, PA-C      . ascorbic acid (VITAMIN C) tablet 500 mg  500 mg Oral Daily Ainsley Spinner, PA-C   500 mg at 01/30/21 0802  . cholecalciferol (VITAMIN D3) tablet 2,000 Units  2,000 Units Oral BID Ainsley Spinner, PA-C   2,000 Units at 01/30/21 8144  . divalproex (DEPAKOTE) DR tablet 125 mg  125 mg Oral BID Ainsley Spinner, PA-C   125 mg at 01/30/21 8185  . docusate sodium (COLACE) capsule 100 mg  100 mg Oral BID PRN Ainsley Spinner, PA-C      . donepezil (ARICEPT) tablet 10 mg  10 mg Oral QHS Ainsley Spinner, PA-C   10 mg at 01/29/21 2050  . feeding supplement (ENSURE ENLIVE / ENSURE PLUS) liquid 237 mL  237 mL Oral BID BM Ainsley Spinner, PA-C   237 mL at  01/30/21 1355  . ferrous sulfate tablet 325 mg  325 mg Oral Daily Ainsley Spinner, PA-C   325 mg at 01/30/21 6962  . ibuprofen (ADVIL) tablet 200 mg  200 mg Oral QID Debbe Odea, MD   200 mg at 01/30/21 1354  . levothyroxine (SYNTHROID) tablet 125 mcg  125 mcg Oral QAC breakfast Ainsley Spinner, PA-C   125 mcg at 01/30/21 9528  . loratadine (CLARITIN) tablet 10 mg  10 mg Oral Daily Ainsley Spinner, PA-C   10 mg at 01/30/21 4132  . morphine 2 MG/ML injection 0.5 mg  0.5 mg Intravenous Q4H PRN Debbe Odea, MD      . multivitamin with minerals tablet 1 tablet  1 tablet Oral QHS Ainsley Spinner, PA-C   1 tablet at 01/29/21 2100  .  ondansetron (ZOFRAN) tablet 4 mg  4 mg Oral Q6H PRN Ainsley Spinner, PA-C       Or  . ondansetron [facility]) injection 4 mg  4 mg Intravenous Q6H PRN Ainsley Spinner, PA-C      . oyster calcium tablet 1,250 mg  1,250 mg Oral QHS Ainsley Spinner, PA-C   1,250 mg at 01/29/21 2048  . pantoprazole (PROTONIX) EC tablet 40 mg  40 mg Oral Daily Ainsley Spinner, PA-C   40 mg at 01/30/21 0802  . QUEtiapine (SEROQUEL) tablet 12.5 mg  12.5 mg Oral QHS PRN Debbe Odea, MD      . rosuvastatin (CRESTOR) tablet 20 mg  20 mg Oral QHS Ainsley Spinner, PA-C   20 mg at 01/29/21 2100  . senna (SENOKOT) tablet 8.6 mg  1 tablet Oral Daily Debbe Odea, MD   8.6 mg at 01/30/21 1220     Discharge Medications: Please see discharge summary for a list of discharge medications.  Relevant Imaging Results:  Relevant Lab Results:   Additional Information SS# [id number]  Sharin Mons, RN

## 2021-01-30 NOTE — Op Note (Signed)
01/28/2021  8:40 AM  PATIENT:  [name]  87 y.o. female  PRE-OPERATIVE DIAGNOSIS:  RIGHT FEMUR FRACTURE, SHAFT AND SUPRACONDYLAR  POST-OPERATIVE DIAGNOSIS:  RIGHT FEMUR FRACTURE, SHAFT AND SUPRACONDYLAR  PROCEDURE:  Procedure(s): INTRAMEDULLARY NAILING OF THE RIGHT FEMUR USING RETROGRADE ARTHREX 11 X 380 MM STATICALLY LOCKED  SURGEON:  Surgeon(s) and Role:    Altamese Atkins, MD - Primary  PHYSICIAN ASSISTANT: 1. Ainsley Spinner, PA-C; 2. PA Student  ANESTHESIA:   general  I/O:  No intake/output data recorded.  SPECIMEN:  No Specimen  TOURNIQUET:  * No tourniquets in log *  COMPLICATIONS: NONE  DICTATION: .Note written in EPIC  DISPOSITION: TO PACU  CONDITION: STABLE  DELAY START OF DVT PROPHYLAXIS BECAUSE OF BLEEDING RISK: NO   BRIEF SUMMARY OF INDICATION FOR PROCEDURE:  [name] is a  87 y.o. who sustained right femur fracture in ground level fall. 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 The risks and benefits of this operation were discussed with the patient including the possibilities of infection, nerve injury, vessel injury, DVT/ PE, loss of motion, arthritis, symptomatic hardware, and need for further surgery among others.  After full discussion, the patient gave consent to proceed.  BRIEF SUMMARY OF PROCEDURE:  After administration of preoperative antibiotics, the patient was taken to the operating room where general anesthesia was induced.  Careful positioning was performed with a bump placed under the right hip, and control of the fracture with distraction during positioning and prepping to prevent neurovascular injury. Standard prep and drape was performed with chlorhexidine scrub and wash initially, then Betadine scrub and paint.   Time-out was held and then the radiolucent triangle and towel bumps used to gain length and sagittal alignment with traction and flexion of the knee.  A 2.5 cm incision was made at the base of the patellar tendon, extending proximally, followed with a medial parapatellar retinacular incision.  The sharp guide pin was placed directly anterior to Blumensaat's line in the middle of the condyle and advanced on AP and lateral projections into the center-center position of the distal femur. My assistant continued traction while I fine tuned the bumps and he also applied pressure with a mallet to produce coronal plane angulation.  The starting reamer was then used distally while protecting the soft tissues.  This was followed by introduction of the ball-tipped guidewire across the fracture site into the proximal femur up close to the piriformis.  Nail length was measured. Sequential reaming followed while maintaining reduction, encountering chatter at 11 mm and reaming up to 12.5 mm, placing a 11 x 380 mm nail.  After confirming appropriate seating of the nail beyond Blumensaat's line on the lateral, locking screws were placed distally off the guide down into the  condyles, and checked on orthogonal images to confirm placement within the nail and appropriate length. While my assistant pulled traction to restore full length, two proximal locks were then placed in static mode using perfect circle technique and a captured screwdriver.  These screws were also confirmed for length and position. At the conclusion of the procedure, I examined the knee for varus and valgus stability after fixation and did not identify significant instability. Also with my assistant, Ainsley Spinner, PA-C, we then irrigated all wounds thoroughly and closed them  in standard layered fashion, working simultaneously to reduce overall time in the OR. The wounds were dressed and a gently compressive wrap from the ankle to thigh was applied.  The patient  was awakened and taken to the PACU in stable condition.    PROGNOSIS:  The patient will have unrestricted range of motion of the knee and hip, and early mobilization will be encouraged with weight bearing for transfers.  DVT prophylaxis will be with weight-based Lovenox. Metabolic bone workup is ongoing. Given the patient's associated injuries and comorbidities the risk of complications remains elevated which has been discussed with the family.    Astrid Divine. Marcelino Scot, M.D.

## 2021-01-30 NOTE — TOC Initial Note (Addendum)
Transition of Care [facility]) - Initial/Assessment Note    Patient Details  Name: [name] MRN: [id number] Date of Birth: [birth year]  Transition of [facility]) CM/SW Contact:    Sharin Mons, RN Phone Number: 01/30/2021, 2:07 PM  Clinical Narrative:          Admitted s/p fall, AMS. Suffered R femur fx. From home with son, [name]. Hx of HTN, prior L elbow replacement, obesity, osteoporosis, dementia.      - s/p R femur ORIF 2/8        RNCM received consult for possible SNF placement at time of discharge. RNCM spoke with patient's son [name] regarding PT recommendation of SNF placement at time of discharge. [name] reported that he is currently unable to care for patient at their home given patient's current physical needs and fall risk. [name] expressed understanding of PT recommendation and is agreeable to SNF placement at time of discharge for mom. [name] reports preference for [facility]. RNCM discussed insurance authorization process and provided Medicare SNF ratings list.  No further questions reported at this time. RNCM to continue to follow and assist with discharge planning needs.  SNF referral faxed out for bed offers.  Expected Discharge Plan: Skilled Nursing Facility Barriers to Discharge: No SNF bed   Patient Goals and CMS Choice        Expected Discharge Plan and Services Expected Discharge Plan: [place]                                              Prior Living Arrangements/Services                       Activities of Daily Living      Permission Sought/Granted                  Emotional Assessment              Admission diagnosis:  Trauma [T14.90XA] Fall [W19.XXXA] Closed fracture of right distal femur ([place]) [S72.401A] Closed displaced comminuted fracture of shaft of right femur, initial encounter ([place]) [S72.351A] Closed displaced transverse fracture of shaft of left humerus, initial  encounter [S42.322A] Fall, initial encounter [W19.XXXA] Patient Active Problem List   Diagnosis Date Noted  . Closed fracture of left humerus 01/27/2021  . Delirium 01/27/2021  . Closed fracture of right distal femur ([place]) 01/27/2021  . UTI (urinary tract infection) 03/02/2013  . Fracture of L1 vertebra ([place]) 02/27/2013  . Chronic back pain greater than 3 months duration 02/27/2013  . RBBB 12/09/2012  . HTN (hypertension) 11/09/2011  . Bradycardia 11/09/2011   PCP:  Prince Solian, MD Pharmacy:   [id number] - [place], [address] AT [address] [place] [address] Phone: [phone] Fax: [phone]     Social Determinants of Health (SDOH) Interventions    Readmission Risk Interventions No flowsheet data found.

## 2021-01-30 NOTE — Progress Notes (Addendum)
PROGRESS NOTE    CACHET MCCUTCHEN   GEX:[id number]  DOB: 09/29/1931  DOA: 01/27/2021 PCP: Prince Solian, MD   Brief Narrative:  [name] This is an 87 year old female with history significant for hypertension as well as history of chronic left humeral fracture who presented to the ED after a mechanical fall at home in the setting of hallucinations and confusion.  She was noted to have a right distal acute femoral fracture as well as noted chronic left humeral fracture.  Family members were concerned about UTI which it appears that she has, and therefore, has been started on Rocephin empirically with urine cultures pending.    Subjective: No complaints. Per son, she became a little confused yesterday evening after receiving oral pain medication. Today she has received Morphine and is sleepy from it. Oral intake has been poor due to the confusion and now the sleepiness. In fact she has not eaten or drank anything.     Assessment & Plan:   Principal Problem:   Closed fracture of right distal femur - s/p repair on 01/28/21 -Continue plan per orthopedic surgery -Although the patient states that she would like to go home, the patient's son states that he is unable to care for her in this condition and prefers she goes to skilled nursing until she is more mobile  Active Problems: Acute blood loss anemia -Secondary to above-mentioned fracture -2/9> Hemoglobin dropped from 9.8-6.6 -1 unit of packed red blood cell transfusion ordered- Hb 7.9 this AM - continue to follow Hb tomorrow  Dementia with acute delirium -Suspected to have delirium secondary to urinary tract infection -According to the son the patient does have dementia at baseline -Continue Depakote and Aricept - I do feel she may be sundowning in the evening and her confusion yesterday evening might have nothing to due with the Hydrocodone she was given- I have ordered a small dose of Seroquel PRN for this evening if she is  agitated  Pain control - of right hip after fracture repair - d/c Hydrocodone as the son is concerned it increased her confusion - add APAP 1 gm TID and Ibuprofen 200 mg QID and try to avoid Morphine if possible   Poor oral intake - in setting of confusion and then sedation- follow oral intake- not stable to leave the hospital until we see her eating and drinking   CKD 3a -Cr rose from 1.02 to 1.37 yesterday (possibly due to acute blood loss)  no Bmet today- will order-   Dysphagia?  Hiatal hernia -According to the patient's son, the patient sometimes coughs when drinking thin liquids and coughs in her sleep especially when she is laying flat in the bed -Son believes this is secondary to hiatal hernia-he does state that the coughing is worse when she uses a straw to drink liquids and therefore I do suspect she has some aspiration issues-no history of frequent pneumonias however -Have recommended to RN she avoid straws and not be laid flat in the bed  UTI - has been given ceftriaxone x 3 days - cultures showing multiple morphotypes  Hypotension with a history of HTN (hypertension) -BP dropped as low as 88/36 this morning-likely secondary to acute blood loss -Holding hydrochlorothiazide and Avapro    Closed fracture of left humerus -Chronic   Time spent in minutes: 35 DVT prophylaxis: We will start Lovenox once hemoglobin stabilizes Code Status: Full code Family Communication: Son at the bedside Level of Care: Level of care: Med-Surg Disposition Plan:  Status is: Inpatient  Remains inpatient appropriate because:IV treatments appropriate due to intensity of illness or inability to take PO  F/u oral intake F/u pain control F/u Hb tomorrow   Dispo: The patient is from: Home              Anticipated d/c is to: SNF              Anticipated d/c date is: 3 days              Patient currently is not medically stable to d/c.   Difficult to place patient No  Consultants:    Orthopedic surgery Procedures:   Repair of hip fracture Antimicrobials:  Anti-infectives (From admission, onward)   Start     Dose/Rate Route Frequency Ordered Stop   01/28/21 1815  ceFAZolin (ANCEF) IVPB 1 g/50 mL premix        1 g 100 mL/hr over 30 Minutes Intravenous Every 6 hours 01/28/21 1806 01/29/21 1214   01/28/21 1045  ceFAZolin (ANCEF) IVPB 2g/100 mL premix  Status:  Discontinued        2 g 200 mL/hr over 30 Minutes Intravenous On call to O.R. 01/28/21 1030 01/28/21 1806   01/28/21 1034  ceFAZolin (ANCEF) 2-4 GM/100ML-% IVPB       Note to Pharmacy: Odelia Gage   : cabinet override      01/28/21 1034 01/28/21 2244   01/27/21 2045  cefTRIAXone (ROCEPHIN) 1 g in sodium chloride 0.9 % 100 mL IVPB  Status:  Discontinued        1 g 200 mL/hr over 30 Minutes Intravenous Every 24 hours 01/27/21 2042 01/30/21 1113       Objective: Vitals:   01/29/21 1808 01/29/21 1900 01/30/21 0300 01/30/21 0751  BP:  (!) 116/53 (!) 142/60 (!) 125/56  Pulse:  90 78 76  Resp:  20 19 16   Temp:  98.3 F (36.8 C) 98.1 F (36.7 C) 98 F (36.7 C)  TempSrc:  Oral Oral Oral  SpO2: 94% 92% 99% 97%  Weight:      Height:        Intake/Output Summary (Last 24 hours) at 01/30/2021 1115 Last data filed at 01/30/2021 0300 Gross per 24 hour  Intake 150 ml  Output 175 ml  Net -25 ml   Filed Weights   01/28/21 1051  Weight: 76.2 kg    Examination: General exam: Appears comfortable  HEENT: PERRLA, oral mucosa moist, no sclera icterus or thrush Respiratory system: Clear to auscultation. Respiratory effort normal. Cardiovascular system: S1 & S2 heard, regular rate and rhythm Gastrointestinal system: Abdomen soft, non-tender, nondistended. Normal bowel sounds   Central nervous system: Alert and oriented to place and person. No focal neurological deficits. Extremities: No cyanosis, clubbing - edema of right hip noted Skin: No rashes or ulcers Psychiatry:  Mood & affect appropriate.      Data Reviewed: I have personally reviewed following labs and imaging studies  CBC: Recent Labs  Lab 01/27/21 1710 01/27/21 2209 01/28/21 0752 01/29/21 0241 01/29/21 1355 01/30/21 0125  WBC 16.9*  --  7.8 6.9  --  7.7  NEUTROABS 14.1*  --   --   --   --   --   HGB 11.4* 9.8* 8.8* 6.6* 8.2* 7.9*  HCT 37.3 31.0* 27.9* 21.3* 25.6* 22.9*  MCV 97.1  --  96.5 96.8  --  93.1  PLT 208  --  157 114*  --  272*   Basic Metabolic Panel:  Recent Labs  Lab 01/27/21 1710 01/28/21 0752 01/29/21 0241  NA 139 138 138  K 4.1 4.0 4.1  CL 102 103 105  CO2 24 25 24   GLUCOSE 153* 112* 149*  BUN 20 20 24*  CREATININE 1.01* 1.02* 1.37*  CALCIUM 8.9 8.3* 8.0*  MG  --   --  1.8   GFR: Estimated Creatinine Clearance: 27 mL/min (A) (by C-G formula based on SCr of 1.37 mg/dL (H)). Liver Function Tests: Recent Labs  Lab 01/27/21 1710  AST 23  ALT 21  ALKPHOS 50  BILITOT 0.7  PROT 6.1*  ALBUMIN 3.3*   No results for input(s): LIPASE, AMYLASE in the last 168 hours. No results for input(s): AMMONIA in the last 168 hours. Coagulation Profile: Recent Labs  Lab 01/27/21 1710  INR 1.1   Cardiac Enzymes: No results for input(s): CKTOTAL, CKMB, CKMBINDEX, TROPONINI in the last 168 hours. BNP (last 3 results) No results for input(s): PROBNP in the last 8760 hours. HbA1C: No results for input(s): HGBA1C in the last 72 hours. CBG: No results for input(s): GLUCAP in the last 168 hours. Lipid Profile: No results for input(s): CHOL, HDL, LDLCALC, TRIG, CHOLHDL, LDLDIRECT in the last 72 hours. Thyroid Function Tests: No results for input(s): TSH, T4TOTAL, FREET4, T3FREE, THYROIDAB in the last 72 hours. Anemia Panel: No results for input(s): VITAMINB12, FOLATE, FERRITIN, TIBC, IRON, RETICCTPCT in the last 72 hours. Urine analysis:    Component Value Date/Time   COLORURINE AMBER (A) 01/27/2021 1954   APPEARANCEUR HAZY (A) 01/27/2021 1954   LABSPEC 1.026 01/27/2021 1954   PHURINE 5.0  01/27/2021 1954   GLUCOSEU NEGATIVE 01/27/2021 1954   HGBUR MODERATE (A) 01/27/2021 1954   BILIRUBINUR NEGATIVE 01/27/2021 1954   KETONESUR 5 (A) 01/27/2021 1954   PROTEINUR 30 (A) 01/27/2021 1954   UROBILINOGEN 0.2 09/18/2015 1737   NITRITE NEGATIVE 01/27/2021 1954   LEUKOCYTESUR MODERATE (A) 01/27/2021 1954   Sepsis Labs: @LABRCNTIP (procalcitonin:4,lacticidven:4) ) Recent Results (from the past 240 hour(s))  SARS CORONAVIRUS 2 (TAT 6-24 HRS) Nasopharyngeal Nasopharyngeal Swab     Status: None   Collection Time: 01/27/21  7:47 PM   Specimen: Nasopharyngeal Swab  Result Value Ref Range Status   SARS Coronavirus 2 NEGATIVE NEGATIVE Final    Comment: (NOTE) SARS-CoV-2 target nucleic acids are NOT DETECTED.  The SARS-CoV-2 RNA is generally detectable in upper and lower respiratory specimens during the acute phase of infection. Negative results do not preclude SARS-CoV-2 infection, do not rule out co-infections with other pathogens, and should not be used as the sole basis for treatment or other patient management decisions. Negative results must be combined with clinical observations, patient history, and epidemiological information. The expected result is Negative.  Fact Sheet for Patients: SugarRoll.be  Fact Sheet for Healthcare Providers: https://www.woods-mathews.com/  This test is not yet approved or cleared by the [place] FDA and  has been authorized for detection and/or diagnosis of SARS-CoV-2 by FDA under an Emergency Use Authorization (EUA). This EUA will remain  in effect (meaning this test can be used) for the duration of the COVID-19 declaration under Se ction 564(b)(1) of the Act, 21 U.S.C. section 360bbb-3(b)(1), unless the authorization is terminated or revoked sooner.  Performed at [facility], [place] [address], [place], [place] [address]   Urine culture     Status: Abnormal   Collection Time: 01/27/21   7:54 PM   Specimen: Urine, Random  Result Value Ref Range Status   Specimen Description URINE,  RANDOM  Final   Special Requests   Final    NONE Performed at [facility], [place] [address], [place], [place] [address]    Culture (A)  Final    >=100,000 COLONIES/mL MULTIPLE SPECIES PRESENT, SUGGEST RECOLLECTION   Report Status 01/29/2021 FINAL  Final         Radiology Studies: DG C-Arm 1-60 Min  Result Date: 01/28/2021 CLINICAL DATA:  Trauma.  ORIF of right distal femoral fracture. EXAM: RIGHT FEMUR 2 VIEWS; DG C-ARM 1-60 MIN COMPARISON:  January 27, 2021. FINDINGS: Fluoro time: 1 minutes 27 seconds. Reported radiation: 10.68 mGy. Nine C-arm fluoroscopic images were obtained intraoperatively and submitted for post operative interpretation. These images demonstrate surgical changes associated with intramedullary rod and screw fixation of a distal femoral metadiaphyseal fracture. Final images demonstrate improved, near anatomic alignment. Please see the performing provider's procedural report for further detail. IMPRESSION: Intraoperative fluoroscopic imaging, as detailed above. Electronically Signed   By: Margaretha Sheffield MD   On: 01/28/2021 13:57   DG FEMUR, MIN 2 VIEWS RIGHT  Result Date: 01/28/2021 CLINICAL DATA:  Trauma.  ORIF of right distal femoral fracture. EXAM: RIGHT FEMUR 2 VIEWS; DG C-ARM 1-60 MIN COMPARISON:  January 27, 2021. FINDINGS: Fluoro time: 1 minutes 27 seconds. Reported radiation: 10.68 mGy. Nine C-arm fluoroscopic images were obtained intraoperatively and submitted for post operative interpretation. These images demonstrate surgical changes associated with intramedullary rod and screw fixation of a distal femoral metadiaphyseal fracture. Final images demonstrate improved, near anatomic alignment. Please see the performing provider's procedural report for further detail. IMPRESSION: Intraoperative fluoroscopic imaging, as detailed above. Electronically Signed   By:  Margaretha Sheffield MD   On: 01/28/2021 13:57   DG FEMUR PORT, MIN 2 VIEWS RIGHT  Result Date: 01/28/2021 CLINICAL DATA:  Status post ORIF of distal right femoral fracture EXAM: RIGHT FEMUR PORTABLE 2 VIEW COMPARISON:  Intraoperative films from earlier in the same day. FINDINGS: Medullary rod is noted with proximal and distal fixation screws. Fracture fragments are in near anatomic alignment. No soft tissue abnormality is seen. IMPRESSION: Status post ORIF of mid to distal right femoral fracture. Electronically Signed   By: Inez Catalina M.D.   On: 01/28/2021 16:45      Scheduled Meds: . acetaminophen  1,000 mg Oral TID  . vitamin C  500 mg Oral Daily  . cholecalciferol  2,000 Units Oral BID  . divalproex  125 mg Oral BID  . donepezil  10 mg Oral QHS  . feeding supplement  237 mL Oral BID BM  . ferrous sulfate  325 mg Oral Daily  . ibuprofen  200 mg Oral QID  . levothyroxine  125 mcg Oral QAC breakfast  . loratadine  10 mg Oral Daily  . multivitamin with minerals  1 tablet Oral QHS  . oyster calcium  1,250 mg Oral QHS  . pantoprazole  40 mg Oral Daily  . rosuvastatin  20 mg Oral QHS  . senna  1 tablet Oral Daily   Continuous Infusions:    LOS: 3 days      Debbe Odea, MD [facility] Pager: [email or web address] 01/30/2021, 11:15 AM

## 2021-01-31 ENCOUNTER — Inpatient Hospital Stay (HOSPITAL_COMMUNITY): Payer: Medicare Other

## 2021-01-31 DIAGNOSIS — S72351A Displaced comminuted fracture of shaft of right femur, initial encounter for closed fracture: Principal | ICD-10-CM

## 2021-01-31 DIAGNOSIS — S42322A Displaced transverse fracture of shaft of humerus, left arm, initial encounter for closed fracture: Secondary | ICD-10-CM

## 2021-01-31 DIAGNOSIS — W19XXXD Unspecified fall, subsequent encounter: Secondary | ICD-10-CM

## 2021-01-31 LAB — CBC
HCT: 23.9 % — ABNORMAL LOW (ref 36.0–46.0)
Hemoglobin: 7.4 g/dL — ABNORMAL LOW (ref 12.0–15.0)
MCH: 30.3 pg (ref 26.0–34.0)
MCHC: 31 g/dL (ref 30.0–36.0)
MCV: 98 fL (ref 80.0–100.0)
Platelets: 128 10*3/uL — ABNORMAL LOW (ref 150–400)
RBC: 2.44 MIL/uL — ABNORMAL LOW (ref 3.87–5.11)
RDW: 14 % (ref 11.5–15.5)
WBC: 7.1 10*3/uL (ref 4.0–10.5)
nRBC: 0 % (ref 0.0–0.2)

## 2021-01-31 LAB — SARS CORONAVIRUS 2 (TAT 6-24 HRS): SARS Coronavirus 2: NEGATIVE

## 2021-01-31 MED ORDER — RIVAROXABAN 15 MG PO TABS
15.0000 mg | ORAL_TABLET | Freq: Two times a day (BID) | ORAL | Status: DC
  Filled 2021-01-31 (×2): qty 1

## 2021-01-31 MED ORDER — SENNA 8.6 MG PO TABS: 1.0000 | ORAL_TABLET | Freq: Every day | ORAL | 0 refills | Status: AC

## 2021-01-31 MED ORDER — ACETAMINOPHEN 500 MG PO TABS: 1000.0000 mg | ORAL_TABLET | Freq: Three times a day (TID) | ORAL | 0 refills | Status: AC

## 2021-01-31 MED ORDER — RIVAROXABAN 10 MG PO TABS
10.0000 mg | ORAL_TABLET | Freq: Every day | ORAL | Status: DC
  Administered 2021-01-31 – 2021-02-02 (×3): 10 mg via ORAL
  Filled 2021-01-31 (×3): qty 1

## 2021-01-31 MED ORDER — QUETIAPINE FUMARATE 25 MG PO TABS: 12.5000 mg | ORAL_TABLET | Freq: Every evening | ORAL | 0 refills | Status: AC | PRN

## 2021-01-31 MED ORDER — RESOURCE THICKENUP CLEAR PO POWD
ORAL | Status: DC | PRN
  Filled 2021-01-31: qty 125

## 2021-01-31 MED ORDER — SODIUM CHLORIDE 0.9 % IV SOLN: INTRAVENOUS | Status: AC

## 2021-01-31 MED ORDER — IBUPROFEN 200 MG PO TABS: 200.0000 mg | ORAL_TABLET | Freq: Four times a day (QID) | ORAL | 0 refills | Status: DC

## 2021-01-31 MED ORDER — ENSURE ENLIVE PO LIQD: 237.0000 mL | Freq: Two times a day (BID) | ORAL | 12 refills | Status: AC

## 2021-01-31 NOTE — Care Management Important Message (Signed)
Important Message  Patient Details  Name: [name] MRN: [id number] Date of Birth: 10-Nov-1931   Medicare Important Message Given:  Yes     [name] [name] 01/31/2021, 1:09 PM

## 2021-01-31 NOTE — TOC Progression Note (Addendum)
Transition of Care (TOC) - Progression Note    Patient Details  Name: [name] MRN: [id number] Date of Birth: 04/10/1931  Transition of Care (TOC) CM/SW Contact    , LCSWA Phone Number: 01/31/2021, 11:07 AM  Clinical Narrative:    CSW confirmed with pt's son that he would like for his mother to go to [facility]. [facility] noted they could take her today or tomorrow. The only barrier is a covid test. SW will setup DC when all requirements are met. SW will continue to follow.  3:15 Covid test is still pending, son requests that pt not DC until morning. [facility] can take pt in the morning. Will plan on AM DC.   Expected Discharge Plan: Skilled Nursing Facility Barriers to Discharge: No SNF bed  Expected Discharge Plan and Services Expected Discharge Plan: Skilled Nursing Facility         Expected Discharge Date: 01/31/21                                     Social Determinants of Health (SDOH) Interventions    Readmission Risk Interventions No flowsheet data found.  

## 2021-01-31 NOTE — Progress Notes (Signed)
Orthopaedic Trauma Service Progress Note  Patient ID: [name] MRN: [id number] DOB/AGE: 85/16/1932 85 y.o.  Subjective:  Doing ok  Pain R leg tolerable +2 assist for therapies    ROS As above  Objective:   VITALS:   Vitals:   01/30/21 0751 01/30/21 1459 01/30/21 1926 01/31/21 0411  BP: (!) 125/56 (!) 114/53 (!) 113/59 (!) 129/51  Pulse: 76 75 70 60  Resp: 16 16 18 18   Temp: 98 F (36.7 C) 97.7 F (36.5 C) 98.2 F (36.8 C) (!) 97.5 F (36.4 C)  TempSrc: Oral Oral Oral Oral  SpO2: 97% 97% 98% 99%  Weight:      Height:        Estimated body mass index is 31.74 kg/m as calculated from the following:   Height as of this encounter: 5\' 1"  (1.549 m).   Weight as of this encounter: 76.2 kg.   Intake/Output      02/10 0701 02/11 0700 02/11 0701 02/12 0700   P.O.     I.V. (mL/kg)     Blood     IV Piggyback     Total Intake(mL/kg)     Urine (mL/kg/hr)     Total Output     Net          Urine Occurrence 2 x      LABS  Results for orders placed or performed during the hospital encounter of 01/27/21 (from the past 24 hour(s))  Basic metabolic panel     Status: Abnormal   Collection Time: 01/30/21 12:20 PM  Result Value Ref Range   Sodium 140 135 - 145 mmol/L   Potassium 4.2 3.5 - 5.1 mmol/L   Chloride 107 98 - 111 mmol/L   CO2 23 22 - 32 mmol/L   Glucose, Bld 130 (H) 70 - 99 mg/dL   BUN 33 (H) 8 - 23 mg/dL   Creatinine, Ser 1.53 (H) 0.44 - 1.00 mg/dL   Calcium 7.9 (L) 8.9 - 10.3 mg/dL   GFR, Estimated 33 (L) >60 mL/min   Anion gap 10 5 - 15  CBC     Status: Abnormal   Collection Time: 01/31/21  1:09 AM  Result Value Ref Range   WBC 7.1 4.0 - 10.5 K/uL   RBC 2.44 (L) 3.87 - 5.11 MIL/uL   Hemoglobin 7.4 (L) 12.0 - 15.0 g/dL   HCT 23.9 (L) 36.0 - 46.0 %   MCV 98.0 80.0 - 100.0 fL   MCH 30.3 26.0 - 34.0 pg   MCHC 31.0 30.0 - 36.0 g/dL   RDW 14.0 11.5 - 15.5 %   Platelets 128  (L) 150 - 400 K/uL   nRBC 0.0 0.0 - 0.2 %     PHYSICAL EXAM:   Gen: sitting up in bed, NAD, appears well  Lungs: unlabored Cardiac:s1 and s2 Ext:       Right Lower Extremity              Dressings stable                      Spot drainage but nothing substantial   Incisions R knee look excellent, no signs of infection              Ext warm              +  DP pulse              DPN, SPN, TN sensation intact             EHL, FHL, lesser toe motor intact             Ankle flexion, extension, inversion and eversion intact             No DCT   Assessment/Plan: 3 Days Post-Op   Principal Problem:   Closed fracture of right distal femur (HCC) Active Problems:   HTN (hypertension)   Bradycardia   RBBB   Closed fracture of left humerus   Delirium   Anti-infectives (From admission, onward)   Start     Dose/Rate Route Frequency Ordered Stop   01/28/21 1815  ceFAZolin (ANCEF) IVPB 1 g/50 mL premix        1 g 100 mL/hr over 30 Minutes Intravenous Every 6 hours 01/28/21 1806 01/29/21 1214   01/28/21 1045  ceFAZolin (ANCEF) IVPB 2g/100 mL premix  Status:  Discontinued        2 g 200 mL/hr over 30 Minutes Intravenous On call to O.R. 01/28/21 1030 01/28/21 1806   01/28/21 1034  ceFAZolin (ANCEF) 2-4 GM/100ML-% IVPB       Note to Pharmacy: Odelia Gage   : cabinet override      01/28/21 1034 01/28/21 2244   01/27/21 2045  cefTRIAXone (ROCEPHIN) 1 g in sodium chloride 0.9 % 100 mL IVPB  Status:  Discontinued        1 g 200 mL/hr over 30 Minutes Intravenous Every 24 hours 01/27/21 2042 01/30/21 1113    .  POD/HD#: 11  87 y/o female s/p fall with R distal femur fracture    -fall    - R distal femur fracture s/p IMN              WBAT R leg for transfers only x 3 weeks then will likely allow WBAT for ambulation              ROM as tolerated R knee and ankle             Dressing changes as needed    Ok to leave wounds open to air   Clean wounds with soap and water only               Ice prn              PT/OT              - chronic L humerus fracture/nonunion              Followed by dr. Amedeo Plenty              Pt can use left arm as tolerated    - Pain management:             Doing well with scheduled tylenol             Multimodal              Minimize narcotics   - ABL anemia/Hemodynamics             improved    - Medical issues              Per primary    - DVT/PE prophylaxis:             SCDS  Start xarelto x 3 weeks    - ID:              periop abx completed    - Metabolic Bone Disease:             Fragility type fracture             Vitamin d levels look good             Recommend DEXA in 4-8 weeks    - Activity:             WBAT R leg for transfers             Up with assist only    - FEN/GI prophylaxis/Foley/Lines:             Diet as tolerated    - Impediments to fracture healing:             Fragility fracture/poor bone quality/osteoporosis             Thyroid disease                 - Dispo:             snf when bed available   Follow up with ortho in 10 days    Remove sutures around 02/11/2021     Jari Pigg, PA-C [phone] (C) 01/31/2021, 9:39 AM  [facility] [place] [address] [phone] Jenetta [phone] (F)    After 5pm and on the weekends please log on to Amion, go to orthopaedics and the look under the [facility] Call for the provider(s) on call. You can also call our office at [phone] and then follow the prompts to be connected to the call team.

## 2021-01-31 NOTE — Evaluation (Signed)
Clinical/Bedside Swallow Evaluation Patient Details  Name: [name] MRN: [id number] Date of Birth: 08/23/1931  Today's Date: 01/31/2021 Time: SLP Start Time (ACUTE ONLY): 29 SLP Stop Time (ACUTE ONLY): 1048 SLP Time Calculation (min) (ACUTE ONLY): 18 min  Past Medical History:  Past Medical History:  Diagnosis Date  . Anemia   . Dementia ([place])   . GERD (gastroesophageal reflux disease)   . Hiatal hernia   . Hyperlipidemia   . Hypertension   . Hypothyroidism   . Macular degeneration   . Obesity   . Osteoporosis   . Ovarian cancer ([place])    Stage IA grade 2 ovarian cancer  . Seasonal allergies    Past Surgical History:  Past Surgical History:  Procedure Laterality Date  . CHOLECYSTECTOMY, LAPAROSCOPIC    . colectomy  2005    Rectosigmoid colectomy with reanastomosis  . ELBOW ARTHROPLASTY     replacing the humeral stem  . KYPHOPLASTY N/A 03/02/2013   Procedure: KYPHOPLASTY;  Surgeon: Erline Levine, MD;  Location: [facility];  Service: Neurosurgery;  Laterality: N/A;  Lumbar One Kyphoplasty  . ORIF FEMUR FRACTURE Right 01/28/2021   Procedure: OPEN REDUCTION INTERNAL FIXATION (ORIF) DISTAL FEMUR FRACTURE;  Surgeon: Altamese Centerville, MD;  Location: [facility];  Service: Orthopedics;  Laterality: Right;  . OTHER SURGICAL HISTORY    . OTHER SURGICAL HISTORY     Ovarian cancer resection and staging   HPI:  Pt is an 87 y.o. female with PMH includes HTN, prior L elbow replacement, obesity, osteoporosis, dementia. Pt was admitted 01/27/21 with AMS after sustaining  a fall. Imaging revealed R distal femur fx, L humeral head and midshift fx, age indeterminate T2-3 compression deformities. S/p R femur ORIF 2/8. SLP consulted due to reports from patient's son, that the "patient sometimes coughs when drinking thin liquids and coughs in her sleep especially when she is laying flat in the bed. Son believes this is secondary to hiatal hernia-he does state that the coughing is worse when she uses a  straw to drink liquids" Aspiration is therefore suspected by referring MD.   Assessment / Plan / Recommendation Clinical Impression  Pt was seen for bedside swallow evaluation. Pt's son was present and he reported that he is the pt's primary caregiver and that she was asymptomatic of dysphagia prior to surgery. Pt's son stated that the pt now demonstrates coughing with thin liquids and with solids. Oral mechanism exam was [facility] and she presented with adequate, natural dentition. Pt demonstrated throat clearing, coughing, and secondary swallows with ice chips and thin liquids. Aspiration is suspected and a modified barium swallow study is recommended to further assess physiology; it is scheduled for today at 1330. SLP Visit Diagnosis: Dysphagia, unspecified (R13.10)    Aspiration Risk  Mild aspiration risk    Diet Recommendation  (Continue current diet until MBS completed)   Liquid Administration via: Cup;No straw Medication Administration: Crushed with puree Supervision: Patient able to self feed Compensations: Slow rate;Small sips/bites Postural Changes: Seated upright at 90 degrees    Other  Recommendations Oral Care Recommendations: Oral care BID   Follow up Recommendations  (TBD)      Frequency and Duration min 2x/week  2 weeks       Prognosis Prognosis for Safe Diet Advancement: Good Barriers to Reach Goals: Cognitive deficits      Swallow Study   General Date of Onset: 01/30/21 HPI: Pt is an 87 y.o. female with PMH includes HTN, prior L elbow replacement, obesity,  osteoporosis, dementia. Pt was admitted 01/27/21 with AMS after sustaining  a fall. Imaging revealed R distal femur fx, L humeral head and midshift fx, age indeterminate T2-3 compression deformities. S/p R femur ORIF 2/8. SLP consulted due to reports from patient's son, that the "patient sometimes coughs when drinking thin liquids and coughs in her sleep especially when she is laying flat in the bed. Son believes this  is secondary to hiatal hernia-he does state that the coughing is worse when she uses a straw to drink liquids" Aspiration is therefore suspected by referring MD. Type of Study: Bedside Swallow Evaluation Previous Swallow Assessment: None Diet Prior to this Study: Regular;Thin liquids Temperature Spikes Noted: No Respiratory Status: Nasal cannula History of Recent Intubation: Yes Length of Intubations (days):  (for surgery) Date extubated: 01/28/21 Behavior/Cognition: Alert;Cooperative;Pleasant mood Oral Cavity Assessment: Within Functional Limits Oral Care Completed by SLP: No Oral Cavity - Dentition: Adequate natural dentition Vision: Functional for self-feeding Self-Feeding Abilities: Able to feed self Patient Positioning: Upright in bed Baseline Vocal Quality: Normal Volitional Cough: Strong Volitional Swallow: Able to elicit    Oral/Motor/Sensory Function Overall Oral Motor/Sensory Function: Within functional limits   Ice Chips Ice chips: Impaired Presentation: Spoon Pharyngeal Phase Impairments: Throat Clearing - Immediate   Thin Liquid Thin Liquid: Impaired Presentation: Straw;Cup;Self Fed Pharyngeal  Phase Impairments: Throat Clearing - Immediate;Cough - Delayed    Nectar Thick Nectar Thick Liquid: Not tested   Honey Thick Honey Thick Liquid: Not tested   Puree Puree: Within functional limits Presentation: Spoon   Solid     Solid: Within functional limits     Bessie Boyte I. Hardin Negus, [place], [place] Office number [phone] Pager [phone]  Horton Marshall 01/31/2021,11:00 AM

## 2021-01-31 NOTE — Progress Notes (Signed)
Physical Therapy Treatment Patient Details Name: [name] MRN: [id number] DOB: 1931/05/06 Today's Date: 01/31/2021    History of Present Illness Pt is an 87 y.o. female admitted 01/27/21 with AMS who sustained a fall. Imaging revealed R distal femur fx, L humeral head and midshift fx, age indeterminate T2-3 compression deformities. S/p R femur ORIF 2/8. PMH includes HTN, prior L elbow replacement, obesity, osteoporosis, dementia.    PT Comments    Pt supine in bed on arrival.  Pt sleeping initially but as treatment progressed became more alert.  Pt performed multiple sit to stand trials this session.  Pt fatigues quickly and required increased assistance with each trial.  Continue to recommend snf at this time.     Follow Up Recommendations  SNF;Supervision for mobility/OOB     Equipment Recommendations  Wheelchair (measurements PT);Wheelchair cushion (measurements PT);Hospital bed (hoyer lift)    Recommendations for Other Services       Precautions / Restrictions Precautions Precautions: Fall Restrictions Weight Bearing Restrictions: Yes LUE Weight Bearing: Weight bearing as tolerated RLE Weight Bearing: Weight bearing as tolerated Other Position/Activity Restrictions: Unrestricted R knee ROM; chronic L humerus fx, does not need sling (per ortho PA)    Mobility  Bed Mobility Overal bed mobility: Needs Assistance Bed Mobility: Supine to Sit;Sit to Supine     Supine to sit: Max assist;+2 for safety/equipment     General bed mobility comments: assistance to advance hips to edge of bed and to elevate trunk into a seated position. Pt required use of bed pad to scoot hips to edge of bed.    Transfers Overall transfer level: Needs assistance Equipment used: Ambulation equipment used (sara stedy.) Transfers: Sit to/from Stand Sit to Stand: Max assist;+2 physical assistance;Mod assist         General transfer comment: Mod assistance +2 to achieve standing from elevated  surface of bed but in flexed position and unable to place stedy flaps.  Pt more fatigued with each attempt this session and ulitmately required +2 max assistance to move into standing and place flaps.  Pt remains flexed in neck and upper trunk.  Ambulation/Gait                 Stairs             Wheelchair Mobility    Modified Rankin (Stroke Patients Only)       Balance Overall balance assessment: Needs assistance   Sitting balance-Leahy Scale: Fair Sitting balance - Comments: Prolonged sitting EOB, initially with modA progressing to min guard without UE support; stability improved with UE support while performing seated LE ROM     Standing balance-Leahy Scale: Poor Standing balance comment: performed sit to stand from bed multiple attempts with poor mechanics and fatigue.  External assistance to achieve standing.                            Cognition Arousal/Alertness: Awake/alert Behavior During Therapy: Flat affect Overall Cognitive Status: History of cognitive impairments - at baseline                                 General Comments: Per son, pt near baseline cognition although with some more confusion/fatigue. Pt requiring cues to keep eyes open and engage in conversation/mobility, but more alert once seated upright      Exercises General Exercises - Lower Extremity Ankle  Circles/Pumps: AROM;Both;10 reps;Supine Quad Sets: AROM;Both;10 reps;Supine Heel Slides: AROM;AAROM;Both;10 reps;Supine Hip ABduction/ADduction: AROM;AAROM;Both;10 reps;Supine    General Comments        Pertinent Vitals/Pain Pain Assessment: Faces Faces Pain Scale: Hurts even more Pain Location: RLE and LUE Pain Descriptors / Indicators: Grimacing;Guarding;Moaning Pain Intervention(s): Monitored during session;Repositioned    Home Living                      Prior Function            PT Goals (current goals can now be found in the care  plan section) Acute Rehab PT Goals Patient Stated Goal: Son agreeable to post-acute rehab at SNF Potential to Achieve Goals: Fair Progress towards PT goals: Progressing toward goals    Frequency    Min 3X/week      PT Plan Current plan remains appropriate    Co-evaluation              AM-PAC PT "6 Clicks" Mobility   Outcome Measure  Help needed turning from your back to your side while in a flat bed without using bedrails?: A Lot Help needed moving from lying on your back to sitting on the side of a flat bed without using bedrails?: A Lot Help needed moving to and from a bed to a chair (including a wheelchair)?: A Lot Help needed standing up from a chair using your arms (e.g., wheelchair or bedside chair)?: A Lot Help needed to walk in hospital room?: Total Help needed climbing 3-5 steps with a railing? : Total 6 Click Score: 10    End of Session Equipment Utilized During Treatment: Gait belt Activity Tolerance: Patient tolerated treatment well;Patient limited by fatigue;Patient limited by pain Patient left: in bed;with call bell/phone within reach;with bed alarm set;with family/visitor present Nurse Communication: Mobility status;Need for lift equipment PT Visit Diagnosis: Other abnormalities of gait and mobility (R26.89);Muscle weakness (generalized) (M62.81);Pain Pain - Right/Left: Right Pain - part of body: Hip;Leg     Time: 7858-8502 PT Time Calculation (min) (ACUTE ONLY): 35 min  Charges:  $Therapeutic Exercise: 8-22 mins $Therapeutic Activity: 8-22 mins                     Erasmo Leventhal , PTA Acute Rehabilitation Services Pager [phone] Office [phone]     [name] 01/31/2021, 2:42 PM

## 2021-01-31 NOTE — Discharge Summary (Signed)
Physician Discharge Summary [facility]    Patient: [name]                   Admit date: 01/27/2021   DOB: [birth year]             Discharge date:01/31/2021/9:58 AM AVW:[id number]                          PCP: Prince Solian, MD  Disposition: SNF   Recommendations for Outpatient Follow-up:   . Follow with orthopedic surgical team up: in 2 weeks  Discharge Condition: Stable   Code Status:   Code Status: Full Code  Diet recommendation: Cardiac diet -aspiration precaution-please see speech recommendation for consistency of diet.      Discharge Diagnoses:    Principal Problem:   Closed fracture of right distal femur ([place]) Active Problems:   HTN (hypertension)   Bradycardia   RBBB   Closed fracture of left humerus   Delirium   History of Present Illness/ Hospital Course [name] Summary:    [name] This is an 87 year old female with history significant for hypertension as well as history of chronic left humeral fracture who presented to the ED after a mechanical fall at home in the setting of hallucinations and confusion. She was noted to have a right distal acute femoral fracture as well as noted chronic left humeral fracture. Family members were concerned about UTI which it appears that she has, and therefore, has been started on Rocephin empirically with urine cultures pending.    Closed fracture of right distal femur - s/p repair on 01/28/21 -Continue plan per orthopedic surgery -Although the patient states that she would like to go home, the patient's son states that he is unable to care for her in this condition and prefers she goes to skilled nursing until she is more mobile  Active Problems: Acute blood loss anemia -Secondary to above-mentioned fracture -2/9> Hemoglobin dropped from 9.8-6.6 -1 unit of packed red blood cell transfusion ordered-  Hemoglobin 7.9, 7.4, no need for transfusion at this time, hemodynamically stable  Dementia with  acute delirium -Suspected to have delirium secondary to urinary tract infection -According to the son the patient does have dementia at baseline -Continue Depakote and Aricept -Possible exacerbated by sundowning, small dose of Seroquel to be continued as needed  Pain control - of right hip after fracture repair - d/c Hydrocodone as the son is concerned it increased her confusion - add APAP 1 gm TID and Ibuprofen 200 mg QID and try to avoid Morphine if possible   Poor oral intake - in setting of confusion and then sedation- follow oral intake- not stable to leave the hospital until we see her eating and drinking   CKD 3a -Cr rose from 1.02 to 1.37 yesterday (possibly due to acute blood loss) -Recommending oral hydration, monitoring closely  Dysphagia?  Hiatal hernia -According to the patient's son, the patient sometimes coughs when drinking thin liquids and coughs in her sleep especially when she is laying flat in the bed -Son believes this is secondary to hiatal hernia-he does state that the coughing is worse when she uses a straw to drink liquids and therefore I do suspect she has some aspiration issues-no history of frequent pneumonias however -Have recommended to RN she avoid straws and not be laid flat in the bed -Speech consulted for evaluation recommendation --  Continue aspiration precaution -speech recommendation regarding diet consistency  UTI - has been given ceftriaxone x 3 days - cultures showing multiple morphotypes  Hypotension with a history of HTN (hypertension) -BP dropped as low as 88/36 this morning-likely secondary to acute blood loss -Discontinued hydrochlorothiazide and  Avapro -Blood pressure stabilized 129/51 this morning    Closed fracture of left humerus -Chronic -Patient son would like to speak to her primary orthopedic physician -Recommended close follow-up    DVT prophylaxis:  Continue SCDS, compression stockings Code Status: Full  code Family Communication: Son at the bedside  Disposition Plan:  Status is: Inpatient     Dispo: The patient is from: Home  Anticipated d/c is to: SNF     Nutritional status:  Nutrition Problem: Increased nutrient needs Etiology: post-op healing Signs/Symptoms: estimated needs Interventions: Ensure Enlive (each supplement provides 350kcal and 20 grams of protein),MVI  The patient's BMI is: Body mass index is 31.74 kg/m. I agree with the assessment and plan as outlined below:    Discharge Instructions:   Discharge Instructions    Activity as tolerated - No restrictions   Complete by: As directed    Call MD for:  difficulty breathing, headache or visual disturbances   Complete by: As directed    Call MD for:  persistant dizziness or light-headedness   Complete by: As directed    Call MD for:  redness, tenderness, or signs of infection (pain, swelling, redness, odor or green/yellow discharge around incision site)   Complete by: As directed    Call MD for:  temperature >100.4   Complete by: As directed    Diet - low sodium heart healthy   Complete by: As directed    Discharge instructions   Complete by: As directed    Please follow-up with the orthopedic team... Regarding further evaluation hip wound and shoulder fracture   Discharge wound care:   Complete by: As directed    Per recommendation from orthopedic surgical team   Increase activity slowly   Complete by: As directed        Medication List    STOP taking these medications   hydrochlorothiazide 25 MG tablet Commonly known as: HYDRODIURIL   irbesartan 150 MG tablet Commonly known as: Avapro   loratadine 10 MG tablet Commonly known as: CLARITIN     TAKE these medications   acetaminophen 500 MG tablet Commonly known as: TYLENOL Take 2 tablets (1,000 mg total) by mouth 3 (three) times daily for 2 days.   aspirin EC 81 MG tablet Take 81 mg by mouth daily. Swallow whole.    cholecalciferol 25 MCG (1000 UNIT) tablet Commonly known as: VITAMIN D3 Take 1,000 Units by mouth daily.   divalproex 125 MG DR tablet Commonly known as: DEPAKOTE Take 125 mg by mouth 2 (two) times daily.   donepezil 10 MG tablet Commonly known as: ARICEPT Take 10 mg by mouth at bedtime.   DSS 100 MG Caps Take 100 mg by mouth 2 (two) times daily as needed for constipation. For constipation What changed:   when to take this  additional instructions   feeding supplement Liqd Take 237 mLs by mouth 2 (two) times daily between meals.   ferrous sulfate 325 (65 FE) MG EC tablet Take 325 mg by mouth daily.   ibuprofen 200 MG tablet Commonly known as: ADVIL Take 1 tablet (200 mg total) by mouth 4 (four) times daily.   levothyroxine 125 MCG tablet Commonly known as: SYNTHROID Take 125 mcg by mouth daily before breakfast.   multivitamin with  minerals Tabs tablet Take 1 tablet by mouth at bedtime.   OCUVITE LUTEIN 25 PO Take 25 mg by mouth at bedtime.   Oyster Shell Calcium 500 MG Tabs Take 500 mg by mouth at bedtime.   pantoprazole 40 MG tablet Commonly known as: PROTONIX Take 40 mg by mouth daily.   QUEtiapine 25 MG tablet Commonly known as: SEROQUEL Take 0.5 tablets (12.5 mg total) by mouth at bedtime as needed (agitation / sundowning).   rosuvastatin 20 MG tablet Commonly known as: CRESTOR Take 20 mg by mouth at bedtime.   senna 8.6 MG Tabs tablet Commonly known as: SENOKOT Take 1 tablet (8.6 mg total) by mouth daily. Start taking on: February 01, 2021   vitamin C 500 MG tablet Commonly known as: ASCORBIC ACID Take 500 mg by mouth daily.       No Known Allergies   Procedures /Studies:   CT Head Wo Contrast  Result Date: 01/27/2021 CLINICAL DATA:  Head trauma, minor. Additional history provided: Fall, neck pain. EXAM: CT HEAD WITHOUT CONTRAST TECHNIQUE: Contiguous axial images were obtained from the base of the skull through the vertex without  intravenous contrast. COMPARISON:  No pertinent prior exams available for comparison. FINDINGS: Brain: Mild cerebral atrophy. There is no acute intracranial hemorrhage. No demarcated cortical infarct. No extra-axial fluid collection. No evidence of intracranial mass. No midline shift. Vascular: No hyperdense vessel.  Atherosclerotic calcifications. Skull: Normal. Negative for fracture or focal lesion. Sinuses/Orbits: Visualized orbits show no acute finding. No significant paranasal sinus disease. IMPRESSION: No evidence of acute intracranial abnormality. Mild cerebral atrophy. Electronically Signed   By: Kellie Simmering DO   On: 01/27/2021 17:40   CT Cervical Spine Wo Contrast  Result Date: 01/27/2021 CLINICAL DATA:  Neck trauma.  Fall/neck pain. EXAM: CT CERVICAL SPINE WITHOUT CONTRAST TECHNIQUE: Multidetector CT imaging of the cervical spine was performed without intravenous contrast. Multiplanar CT image reconstructions were also generated. COMPARISON:  Chest radiographs 01/07/2010. FINDINGS: Alignment: Trace C4-C5 grade 1 retrolisthesis. 2 mm C7-T1 grade 1 anterolisthesis. Trace T2-T3 grade 1 anterolisthesis. Skull base and vertebrae: The basion-dental and atlanto-dental intervals are maintained.No evidence of acute fracture to the cervical spine. Mild age-indeterminate T2 superior endplate compression deformity with superimposed Schmorl node. Age-indeterminate T3 vertebral compression deformity (30-40% height loss). Soft tissues and spinal canal: No prevertebral fluid or swelling. No visible canal hematoma. Disc levels: Cervical spondylosis with multilevel disc space narrowing, disc bulges, posterior disc osteophytes, uncovertebral hypertrophy and facet arthrosis. Disc space narrowing is moderate/advanced at C4-C5 and C6-C7. Additionally, small central disc protrusions are present at C2-C3 and C3-C4. Facet joint ankylosis on the left at C5-C6. Facet joint ankylosis is also suspected bilaterally at T3-T4. Upper  chest: No consolidation within the imaged lung apices. No visible pneumothorax. IMPRESSION: 1. No evidence of acute fracture to the cervical spine. 2. Age-indeterminate T2 and T3 vertebral compression deformities, as described. 3. Mild C4-C5 grade 1 retrolisthesis. 4. Mild C7-T1 and T2-T3 grade 1 anterolisthesis. 5. Cervical spondylosis, as described. 6. Levels of facet joint ankylosis within the cervical and visualized upper thoracic spine. Electronically Signed   By: Kellie Simmering DO   On: 01/27/2021 17:54   CT Abdomen Pelvis W Contrast  Result Date: 01/27/2021 CLINICAL DATA:  [place] from standing EXAM: CT ABDOMEN AND PELVIS WITH CONTRAST TECHNIQUE: Multidetector CT imaging of the abdomen and pelvis was performed using the standard protocol following bolus administration of intravenous contrast. CONTRAST:  132mL OMNIPAQUE IOHEXOL 300 MG/ML  SOLN COMPARISON:  None.  FINDINGS: Lower chest: No acute pleural or parenchymal lung disease. Hepatobiliary: No focal liver abnormality is seen. Status post cholecystectomy. No biliary dilatation. Pancreas: Unremarkable. No pancreatic ductal dilatation or surrounding inflammatory changes. Spleen: Normal in size without focal abnormality. Adrenals/Urinary Tract: Adrenal glands are unremarkable. Kidneys are normal, without renal calculi, focal lesion, or hydronephrosis. Bladder is unremarkable. Stomach/Bowel: No bowel obstruction or ileus. No bowel wall thickening or inflammatory change. Vascular/Lymphatic: There is ectasia of the thoracoabdominal aorta without aneurysm. Diffuse atherosclerosis. No pathologic adenopathy. Surgical clips along the left pelvic sidewall and retroperitoneum consistent with lymphadenectomy. Reproductive: Status post hysterectomy. No adnexal masses. Other: No free fluid or free gas. Midline supraumbilical ventral hernia contains a portion of the transverse colon, without bowel obstruction or strangulation. There is a small fat containing umbilical  hernia, as well as bilateral fat containing inguinal hernias. Musculoskeletal: There is a chronic L1 compression deformity with vertebra plana and evidence of prior vertebral augmentation. Chronic T11 compression deformity also noted, approximately 50% loss of height. No acute displaced fractures. Reconstructed images demonstrate no additional findings. IMPRESSION: 1. Midline supraumbilical ventral hernia contains a portion of the transverse colon, without bowel obstruction or strangulation. 2. Small fat containing umbilical and bilateral inguinal hernias. 3. Chronic T11 and L1 compression fractures as above. Electronically Signed   By: Randa Ngo M.D.   On: 01/27/2021 20:37   DG Pelvis Portable  Result Date: 01/27/2021 CLINICAL DATA:  [place] EXAM: PORTABLE PELVIS 1-2 VIEWS COMPARISON:  None. FINDINGS: Supine frontal view of the pelvis was obtained, limited by positioning and collimation. The left hemipelvis and left hip are excluded by collimation. I do not see any acute fracture within the visualized portions of the pelvis or right hip. Soft tissues are unremarkable. IMPRESSION: 1. No acute pelvic fracture on this limited evaluation. Electronically Signed   By: Randa Ngo M.D.   On: 01/27/2021 16:22   DG Chest Port 1 View  Result Date: 01/27/2021 CLINICAL DATA:  87 year old female with fall. EXAM: PORTABLE CHEST 1 VIEW COMPARISON:  Chest radiograph dated 06/07/2010. FINDINGS: No focal consolidation, pleural effusion or pneumothorax. Stable cardiac silhouette. Atherosclerotic calcification of the aorta. Osteopenia with degenerative changes of the spine. Old right posterior rib fractures. Left humeral neck fracture better seen on the upper extremity radiograph. Partially visualized left upper extremity hardware. IMPRESSION: 1. No active cardiopulmonary disease. 2. Fracture of the left humeral neck. Electronically Signed   By: Anner Crete M.D.   On: 01/27/2021 16:16   DG Knee Right Port  Result  Date: 01/27/2021 CLINICAL DATA:  [place] EXAM: PORTABLE RIGHT KNEE - 1-2 VIEW COMPARISON:  None. FINDINGS: Frontal and lateral views of the right knee are obtained. There is a comminuted oblique fracture of the distal right femur, with approximately 15 mm of ventral displacement of the distal fracture fragment. The fracture line extends to the trochlear groove of the patellofemoral articulation, consistent with intra-articular fracture. There is a large joint effusion. Diffuse soft tissue edema. IMPRESSION: 1. Intra-articular comminuted fracture of the distal right femur, with 15 mm of ventral displacement of the distal fracture fragment. 2. Large joint effusion. Electronically Signed   By: Randa Ngo M.D.   On: 01/27/2021 16:20   DG Humerus Left  Result Date: 01/27/2021 CLINICAL DATA:  87 year old female with fall. EXAM: LEFT HUMERUS - 2+ VIEW COMPARISON:  Chest radiograph dated 01/27/2021. FINDINGS: There is a mildly displaced fracture humeral neck of the left with approximately 4 mm medial displacement of the distal fracture fragment.  There is fracture of the distal third of the left humeral diaphysis with displacement of the distal fracture fragment and internal fixation hardware. There is advanced osteopenia. The soft tissues are grossly unremarkable. IMPRESSION: 1. Mildly displaced fracture of the left humeral neck. 2. Displaced fracture of the distal humeral diaphysis with displacement of the internal fixation hardware. Electronically Signed   By: Anner Crete M.D.   On: 01/27/2021 16:20   DG C-Arm 1-60 Min  Result Date: 01/28/2021 CLINICAL DATA:  Trauma.  ORIF of right distal femoral fracture. EXAM: RIGHT FEMUR 2 VIEWS; DG C-ARM 1-60 MIN COMPARISON:  January 27, 2021. FINDINGS: Fluoro time: 1 minutes 27 seconds. Reported radiation: 10.68 mGy. Nine C-arm fluoroscopic images were obtained intraoperatively and submitted for post operative interpretation. These images demonstrate surgical changes  associated with intramedullary rod and screw fixation of a distal femoral metadiaphyseal fracture. Final images demonstrate improved, near anatomic alignment. Please see the performing provider's procedural report for further detail. IMPRESSION: Intraoperative fluoroscopic imaging, as detailed above. Electronically Signed   By: Margaretha Sheffield MD   On: 01/28/2021 13:57   DG FEMUR, MIN 2 VIEWS RIGHT  Result Date: 01/28/2021 CLINICAL DATA:  Trauma.  ORIF of right distal femoral fracture. EXAM: RIGHT FEMUR 2 VIEWS; DG C-ARM 1-60 MIN COMPARISON:  January 27, 2021. FINDINGS: Fluoro time: 1 minutes 27 seconds. Reported radiation: 10.68 mGy. Nine C-arm fluoroscopic images were obtained intraoperatively and submitted for post operative interpretation. These images demonstrate surgical changes associated with intramedullary rod and screw fixation of a distal femoral metadiaphyseal fracture. Final images demonstrate improved, near anatomic alignment. Please see the performing provider's procedural report for further detail. IMPRESSION: Intraoperative fluoroscopic imaging, as detailed above. Electronically Signed   By: Margaretha Sheffield MD   On: 01/28/2021 13:57   DG Femur Min 2 Views Right  Result Date: 01/27/2021 CLINICAL DATA:  Distal right femur pain and deformity post fall EXAM: RIGHT FEMUR 2 VIEWS COMPARISON:  None. FINDINGS: There is a comminuted oblique fracture of the distal femoral metadiaphysis with approximately 1 shaft with of medial displacement and impaction of the distal fracture fragment. IMPRESSION: Comminuted displaced oblique fracture of the distal femoral metadiaphysis. Electronically Signed   By: Dahlia Bailiff MD   On: 01/27/2021 18:09   DG FEMUR PORT, MIN 2 VIEWS RIGHT  Result Date: 01/28/2021 CLINICAL DATA:  Status post ORIF of distal right femoral fracture EXAM: RIGHT FEMUR PORTABLE 2 VIEW COMPARISON:  Intraoperative films from earlier in the same day. FINDINGS: Medullary rod is noted with  proximal and distal fixation screws. Fracture fragments are in near anatomic alignment. No soft tissue abnormality is seen. IMPRESSION: Status post ORIF of mid to distal right femoral fracture. Electronically Signed   By: Inez Catalina M.D.   On: 01/28/2021 16:45    Subjective:   Patient was seen and examined 01/31/2021, 9:58 AM Patient stable today. No acute distress.  No issues overnight Stable for discharge.  Discharge Exam:    Vitals:   01/30/21 0751 01/30/21 1459 01/30/21 1926 01/31/21 0411  BP: (!) 125/56 (!) 114/53 (!) 113/59 (!) 129/51  Pulse: 76 75 70 60  Resp: 16 16 18 18   Temp: 98 F (36.7 C) 97.7 F (36.5 C) 98.2 F (36.8 C) (!) 97.5 F (36.4 C)  TempSrc: Oral Oral Oral Oral  SpO2: 97% 97% 98% 99%  Weight:      Height:        General: Pt lying comfortably in bed & appears in no obvious  distress. Cardiovascular: S1 & S2 heard, RRR, S1/S2 +. No murmurs, rubs, gallops or clicks. No JVD or pedal edema. Respiratory: Clear to auscultation without wheezing, rhonchi or crackles. No increased work of breathing. Abdominal:  Non-distended, non-tender & soft. No organomegaly or masses appreciated. Normal bowel sounds heard. CNS: Alert and oriented. No focal deficits. Extremities: no edema, no cyanosis      The results of significant diagnostics from this hospitalization (including imaging, microbiology, ancillary and laboratory) are listed below for reference.      Microbiology:   Recent Results (from the past 240 hour(s))  SARS CORONAVIRUS 2 (TAT 6-24 HRS) Nasopharyngeal Nasopharyngeal Swab     Status: None   Collection Time: 01/27/21  7:47 PM   Specimen: Nasopharyngeal Swab  Result Value Ref Range Status   SARS Coronavirus 2 NEGATIVE NEGATIVE Final    Comment: (NOTE) SARS-CoV-2 target nucleic acids are NOT DETECTED.  The SARS-CoV-2 RNA is generally detectable in upper and lower respiratory specimens during the acute phase of infection. Negative results do not  preclude SARS-CoV-2 infection, do not rule out co-infections with other pathogens, and should not be used as the sole basis for treatment or other patient management decisions. Negative results must be combined with clinical observations, patient history, and epidemiological information. The expected result is Negative.  Fact Sheet for Patients: SugarRoll.be  Fact Sheet for Healthcare Providers: https://www.woods-mathews.com/  This test is not yet approved or cleared by the [place] FDA and  has been authorized for detection and/or diagnosis of SARS-CoV-2 by FDA under an Emergency Use Authorization (EUA). This EUA will remain  in effect (meaning this test can be used) for the duration of the COVID-19 declaration under Se ction 564(b)(1) of the Act, 21 U.S.C. section 360bbb-3(b)(1), unless the authorization is terminated or revoked sooner.  Performed at [facility], [place] [address], [place], [place] [address]   Urine culture     Status: Abnormal   Collection Time: 01/27/21  7:54 PM   Specimen: Urine, Random  Result Value Ref Range Status   Specimen Description URINE, RANDOM  Final   Special Requests   Final    NONE Performed at [facility], [place] [address], [place],  [address]    Culture (A)  Final    >=100,000 COLONIES/mL MULTIPLE SPECIES PRESENT, SUGGEST RECOLLECTION   Report Status 01/29/2021 FINAL  Final     Labs:   CBC: Recent Labs  Lab 01/27/21 1710 01/27/21 2209 01/28/21 0752 01/29/21 0241 01/29/21 1355 01/30/21 0125 01/31/21 0109  WBC 16.9*  --  7.8 6.9  --  7.7 7.1  NEUTROABS 14.1*  --   --   --   --   --   --   HGB 11.4*   < > 8.8* 6.6* 8.2* 7.9* 7.4*  HCT 37.3   < > 27.9* 21.3* 25.6* 22.9* 23.9*  MCV 97.1  --  96.5 96.8  --  93.1 98.0  PLT 208  --  157 114*  --  120* 128*   < > = values in this interval not displayed.   Basic Metabolic Panel: Recent Labs  Lab 01/27/21 1710  01/28/21 0752 01/29/21 0241 01/30/21 1220  NA 139 138 138 140  K 4.1 4.0 4.1 4.2  CL 102 103 105 107  CO2 24 25 24 23   GLUCOSE 153* 112* 149* 130*  BUN 20 20 24* 33*  CREATININE 1.01* 1.02* 1.37* 1.53*  CALCIUM 8.9 8.3* 8.0* 7.9*  MG  --   --  1.8  --    Liver Function Tests: Recent Labs  Lab 01/27/21 1710  AST 23  ALT 21  ALKPHOS 50  BILITOT 0.7  PROT 6.1*  ALBUMIN 3.3*  Urinalysis    Component Value Date/Time   COLORURINE AMBER (A) 01/27/2021 1954   APPEARANCEUR HAZY (A) 01/27/2021 1954   LABSPEC 1.026 01/27/2021 1954   PHURINE 5.0 01/27/2021 1954   GLUCOSEU NEGATIVE 01/27/2021 1954   HGBUR MODERATE (A) 01/27/2021 1954   BILIRUBINUR NEGATIVE 01/27/2021 1954   KETONESUR 5 (A) 01/27/2021 1954   PROTEINUR 30 (A) 01/27/2021 1954   UROBILINOGEN 0.2 09/18/2015 1737   NITRITE NEGATIVE 01/27/2021 1954   LEUKOCYTESUR MODERATE (A) 01/27/2021 1954         Time coordinating discharge: Over 45 minutes  SIGNED: Deatra James, MD, FACP, [facility]. [facility],  Please use [email or web address] to Page If 7PM-7AM, please contact night-coverage [email or web address] [facility] 01/31/2021, 9:58 AM

## 2021-01-31 NOTE — Progress Notes (Signed)
Modified Barium Swallow Progress Note  Patient Details  Name: [name] MRN: [id number] Date of Birth: 10-23-1931  Today's Date: 01/31/2021  Modified Barium Swallow completed.  Full report located under Chart Review in the Imaging Section.  Brief recommendations include the following:  Clinical Impression  Pt presents with oropharynegal dysphagia characterized by impaired bolus cohesion, reduced lingual retraction, and reduced anterior laryngeal movement. Pt demonstrated premature spillage to the pyriform sinuses and incomplete epiglottic inversion which resulted in penetration (PAS 5) and eventual aspiration (PAS 7) of thin liquids. Aspiration consistently resulted in throat clearing which was ineffective. Prompted coughing was inconsistently effective in expelling the aspirate. Some airway protection was achieved by approximation of the arytenoids to the laryungeal surface of the epigllotis with thin liquids and epiglottic inversion was improved with thicker consistencies and with solids. Mild to moderate vallecular and pyriform sinus residue was noted accross consistencies, but the amount of residue increased with bolus size and with advancement of solids. Postural modifications were attempted to reduce aspiration risk, but pt was unable to demonstrate/maintain these positions despite cues. A dysphagia 3 diet with nectar thick liquids is recommended at this time. SLP will continue to follow pt and continued SLP services are recommended following discharge to target dysphagia.   Swallow Evaluation Recommendations       SLP Diet Recommendations: Dysphagia 3 (Mech soft) solids;Nectar thick liquid   Liquid Administration via: Cup; No Straw   Medication Administration: Whole meds with puree   Supervision: Staff to assist with self feeding;Full supervision/cueing for compensatory strategies   Compensations: Slow rate;Small sips/bites;Follow solids with liquid   Postural Changes: Remain  semi-upright after after feeds/meals (Comment);Seated upright at 90 degrees   Oral Care Recommendations: Oral care BID   Other Recommendations: Order thickener from pharmacy   [place] I. Hardin Negus, [place], [place] Office number [phone] Pager [place] 01/31/2021,2:12 PM

## 2021-01-31 NOTE — Discharge Instructions (Addendum)
Orthopaedic Trauma Service Discharge Instructions   General Discharge Instructions  Orthopaedic Injuries:  Right distal femur fracture treated with intramedullary nailing              Chronic left humerus nonunion above total elbow arthroplasty.  Management per Dr. Amedeo Plenty  WEIGHT BEARING STATUS: Weight-bear as tolerated right leg for transfers.  Patient can use left arm as tolerated  RANGE OF MOTION/ACTIVITY: Unrestricted range of motion of right hip and knee  Bone health: Labs show vitamin D deficiency.  Continue vitamin D supplementation.  Recommend bone density scan in 4 to 8 weeks  Wound Care: Daily wound care as needed to right leg.  Okay to leave wounds open to air.  Clean with soap and water only.  Discharge Wound Care Instructions  Do NOT apply any ointments, solutions or lotions to pin sites or surgical wounds.  These prevent needed drainage and even though solutions like hydrogen peroxide kill bacteria, they also damage cells lining the pin sites that help fight infection.  Applying lotions or ointments can keep the wounds moist and can cause them to breakdown and open up as well. This can increase the risk for infection. When in doubt call the office.  Surgical incisions should be dressed daily.  If any drainage is noted, use one layer of adaptic, then gauze, Kerlix, and an ace wrap.  Once the incision is completely dry and without drainage, it may be left open to air out.  Showering may begin 36-48 hours later.  Cleaning gently with soap and water.  Traumatic wounds should be dressed daily as well.    One layer of adaptic, gauze, Kerlix, then ace wrap.  The adaptic can be discontinued once the draining has ceased    If you have a wet to dry dressing: wet the gauze with saline the squeeze as much saline out so the gauze is moist (not soaking wet), place moistened gauze over wound, then place a dry gauze over the moist one, followed by Kerlix wrap, then ace  wrap.  DVT/PE prophylaxis: Xarelto daily x 3 weeks  Diet: as you were eating previously.  Can use over the counter stool softeners and bowel preparations, such as Miralax, to help with bowel movements.  Narcotics can be constipating.  Be sure to drink plenty of fluids  PAIN MEDICATION USE AND EXPECTATIONS  You have likely been given narcotic medications to help control your pain.  After a traumatic event that results in an fracture (broken bone) with or without surgery, it is ok to use narcotic pain medications to help control one's pain.  We understand that everyone responds to pain differently and each individual patient will be evaluated on a regular basis for the continued need for narcotic medications. Ideally, narcotic medication use should last no more than 6-8 weeks (coinciding with fracture healing).   As a patient it is your responsibility as well to monitor narcotic medication use and report the amount and frequency you use these medications when you come to your office visit.   We would also advise that if you are using narcotic medications, you should take a dose prior to therapy to maximize you participation.  IF YOU ARE ON NARCOTIC MEDICATIONS IT IS NOT PERMISSIBLE TO OPERATE A MOTOR VEHICLE (MOTORCYCLE/CAR/TRUCK/MOPED) OR HEAVY MACHINERY DO NOT MIX NARCOTICS WITH OTHER CNS (CENTRAL NERVOUS SYSTEM) DEPRESSANTS SUCH AS ALCOHOL   STOP SMOKING OR USING NICOTINE PRODUCTS!!!!  As discussed nicotine severely impairs your body's ability to heal surgical  and traumatic wounds but also impairs bone healing.  Wounds and bone heal by forming microscopic blood vessels (angiogenesis) and nicotine is a vasoconstrictor (essentially, shrinks blood vessels).  Therefore, if vasoconstriction occurs to these microscopic blood vessels they essentially disappear and are unable to deliver necessary nutrients to the healing tissue.  This is one modifiable factor that you can do to dramatically increase your  chances of healing your injury.    (This means no smoking, no nicotine gum, patches, etc)  DO NOT USE NONSTEROIDAL ANTI-INFLAMMATORY DRUGS (NSAID'S)  Using products such as Advil (ibuprofen), Aleve (naproxen), Motrin (ibuprofen) for additional pain control during fracture healing can delay and/or prevent the healing response.  If you would like to take over the counter (OTC) medication, Tylenol (acetaminophen) is ok.  However, some narcotic medications that are given for pain control contain acetaminophen as well. Therefore, you should not exceed more than 4000 mg of tylenol in a day if you do not have liver disease.  Also note that there are may OTC medicines, such as cold medicines and allergy medicines that my contain tylenol as well.  If you have any questions about medications and/or interactions please ask your doctor/PA or your pharmacist.      ICE AND ELEVATE INJURED/OPERATIVE EXTREMITY  Using ice and elevating the injured extremity above your heart can help with swelling and pain control.  Icing in a pulsatile fashion, such as 20 minutes on and 20 minutes off, can be followed.    Do not place ice directly on skin. Make sure there is a barrier between to skin and the ice pack.    Using frozen items such as frozen peas works well as the conform nicely to the are that needs to be iced.  USE AN ACE WRAP OR TED HOSE FOR SWELLING CONTROL  In addition to icing and elevation, Ace wraps or TED hose are used to help limit and resolve swelling.  It is recommended to use Ace wraps or TED hose until you are informed to stop.    When using Ace Wraps start the wrapping distally (farthest away from the body) and wrap proximally (closer to the body)   Example: If you had surgery on your leg or thing and you do not have a splint on, start the ace wrap at the toes and work your way up to the thigh        If you had surgery on your upper extremity and do not have a splint on, start the ace wrap at your fingers  and work your way up to the upper arm  IF YOU ARE IN A SPLINT OR CAST DO NOT [place]   If your splint gets wet for any reason please contact the office immediately. You may shower in your splint or cast as long as you keep it dry.  This can be done by wrapping in a cast cover or garbage back (or similar)  Do Not stick any thing down your splint or cast such as pencils, money, or hangers to try and scratch yourself with.  If you feel itchy take benadryl as prescribed on the bottle for itching  IF YOU ARE IN A CAM BOOT (BLACK BOOT)  You may remove boot periodically. Perform daily dressing changes as noted below.  Wash the liner of the boot regularly and wear a sock when wearing the boot. It is recommended that you sleep in the boot until told otherwise    Call  office for the following:  Temperature greater than 101F  Persistent nausea and vomiting  Severe uncontrolled pain  Redness, tenderness, or signs of infection (pain, swelling, redness, odor or green/yellow discharge around the site)  Difficulty breathing, headache or visual disturbances  Hives  Persistent dizziness or light-headedness  Extreme fatigue  Any other questions or concerns you may have after discharge  In an emergency, call 911 or go to an Emergency Department at a nearby hospital  HELPFUL INFORMATION  ? If you had a block, it will wear off between 8-24 hrs postop typically.  This is period when your pain may go from nearly zero to the pain you would have had postop without the block.  This is an abrupt transition but nothing dangerous is happening.  You may take an extra dose of narcotic when this happens.  ? You should wean off your narcotic medicines as soon as you are able.  Most patients will be off or using minimal narcotics before their first postop appointment.   ? We suggest you use the pain medication the first night prior to going to bed, in order to ease any pain when the anesthesia  wears off. You should avoid taking pain medications on an empty stomach as it will make you nauseous.  ? Do not drink alcoholic beverages or take illicit drugs when taking pain medications.  ? In most states it is against the law to drive while you are in a splint or sling.  And certainly against the law to drive while taking narcotics.  ? You may return to work/school in the next couple of days when you feel up to it.   ? Pain medication may make you constipated.  Below are a few solutions to try in this order: - Decrease the amount of pain medication if you aren't having pain. - Drink lots of decaffeinated fluids. - Drink prune juice and/or each dried prunes  o If the first 3 don't work start with additional solutions - Take Colace - an over-the-counter stool softener - Take Senokot - an over-the-counter laxative - Take Miralax - a stronger over-the-counter laxative     CALL THE OFFICE WITH ANY QUESTIONS OR CONCERNS: [phone]   VISIT OUR WEBSITE FOR ADDITIONAL INFORMATION: [email or web address]   Information on my medicine - XARELTO (Rivaroxaban)  This medication education was reviewed with me or my healthcare representative as part of my discharge preparation.    Why was Xarelto prescribed for you? Xarelto was prescribed for you to reduce the risk of blood clots forming after orthopedic surgery. The medical term for these abnormal blood clots is venous thromboembolism (VTE).  What do you need to know about xarelto ? Take your Xarelto ONCE DAILY at the same time every day. You may take it either with or without food.  If you have difficulty swallowing the tablet whole, you may crush it and mix in applesauce just prior to taking your dose.  Take Xarelto exactly as prescribed by your doctor and DO NOT stop taking Xarelto without talking to the doctor who prescribed the medication.  Stopping without other VTE prevention medication to take the place of Xarelto may  increase your risk of developing a clot.  After discharge, you should have regular check-up appointments with your healthcare provider that is prescribing your Xarelto.    What do you do if you miss a dose? If you miss a dose, take it as soon as you remember on the same day then continue your  regularly scheduled once daily regimen the next day. Do not take two doses of Xarelto on the same day.   Important Safety Information A possible side effect of Xarelto is bleeding. You should call your healthcare provider right away if you experience any of the following: ? Bleeding from an injury or your nose that does not stop. ? Unusual colored urine (red or dark brown) or unusual colored stools (red or black). ? Unusual bruising for unknown reasons. ? A serious fall or if you hit your head (even if there is no bleeding).  Some medicines may interact with Xarelto and might increase your risk of bleeding while on Xarelto. To help avoid this, consult your healthcare provider or pharmacist prior to using any new prescription or non-prescription medications, including herbals, vitamins, non-steroidal anti-inflammatory drugs (NSAIDs) and supplements.  This website has more information on Xarelto: [email or web address]

## 2021-01-31 NOTE — Progress Notes (Signed)
ANTICOAGULATION CONSULT NOTE - Initial Consult  Pharmacy Consult for Xarelto Indication: VTE prophylaxis for 21 days  No Known Allergies  Patient Measurements: Height: 5\' 1"  (154.9 cm) Weight: 76.2 kg (167 lb 15.9 oz) IBW/kg (Calculated) : 47.8   Vital Signs: Temp: 97.5 F (36.4 C) (02/11 0411) Temp Source: Oral (02/11 0411) BP: 129/51 (02/11 0411) Pulse Rate: 60 (02/11 0411)  Labs: Recent Labs    01/29/21 0241 01/29/21 1355 01/30/21 0125 01/30/21 1220 01/31/21 0109  HGB 6.6* 8.2* 7.9*  --  7.4*  HCT 21.3* 25.6* 22.9*  --  23.9*  PLT 114*  --  120*  --  128*  CREATININE 1.37*  --   --  1.53*  --     Estimated Creatinine Clearance: 24.2 mL/min (A) (by C-G formula based on SCr of 1.53 mg/dL (H)).   Medical History: Past Medical History:  Diagnosis Date  . Anemia   . Dementia ([place])   . GERD (gastroesophageal reflux disease)   . Hiatal hernia   . Hyperlipidemia   . Hypertension   . Hypothyroidism   . Macular degeneration   . Obesity   . Osteoporosis   . Ovarian cancer ([place])    Stage IA grade 2 ovarian cancer  . Seasonal allergies     Assessment: [name] is an 87 year old female with history significant for hypertension as well as history of chronic left humeral fracture who presented to the ED after a mechanical fall at home in the setting of hallucinations and confusion. She was noted to have a right distal acute femoral fracture as well as noted chronic left humeral fracture.  Pharmacy consulted for Xarelto for VTE prophylaxis for 21 days   Plan:  Start Xarelto 10 mg po daily for 21 days Monitor for signs and symptoms of bleeding  Alanda Slim, PharmD, [facility] Clinical Pharmacist Please see AMION for all Pharmacists' Contact Phone Numbers 01/31/2021, 12:43 PM

## 2021-01-31 NOTE — Progress Notes (Signed)
  Speech Language Pathology Treatment: Dysphagia  Patient Details Name: [name] MRN: [id number] DOB: Apr 24, 1931 Today's Date: 01/31/2021 Time: 2353-6144 SLP Time Calculation (min) (ACUTE ONLY): 17 min  Assessment / Plan / Recommendation Clinical Impression  Pt was seen for dysphagia treatment following page from [place], [place]. Pt's RN reported that the pt has been demonstrating significant coughing since the study and that the pt's son is concerned about her and questioning "what they did during that study". Pt's RN was advised that the pt did aspirate during the study and that her coughing may be a delayed response to this since only throat clearing was observed during the study following aspiration event. RN was advised to set up suction and that SLP will come see pt. Pt was coughing upon SLP's arrival and RN was in search of some missing tubing to complete suction set up. Pt and her son were educated regarding the results of the modified barium swallow study, diet recommendations, and swallowing precautions. Video recording of the study was used to facilitate education and both parties verbalized understanding regarding all areas of consideration. Pt's son was educated that the pt's cough was weak during the study and ineffective in fully expelling aspirated material, and that it is likely that she is subsequently re-aspirating the material despite coughing. Pt was prompted to demonstrate a hard cough and swallow thereafter. This process was repeated at least four times and frequency of coughing improved thereafter. All of the pt's and her son's questions were answered to their satisfaction and they verbalized agreement with continued SLP services at the SNF for dysphagia treatment.    HPI HPI: Pt is an 87 y.o. female with PMH includes HTN, prior L elbow replacement, obesity, osteoporosis, dementia. Pt was admitted 01/27/21 with AMS after sustaining  a fall. Imaging revealed R distal femur fx, L  humeral head and midshift fx, age indeterminate T2-3 compression deformities. S/p R femur ORIF 2/8. SLP consulted due to reports from patient's son, that the "patient sometimes coughs when drinking thin liquids and coughs in her sleep especially when she is laying flat in the bed. Son believes this is secondary to hiatal hernia-he does state that the coughing is worse when she uses a straw to drink liquids" Aspiration is therefore suspected by referring MD.      SLP Plan  Continue with current plan of care       Recommendations  Diet recommendations: Dysphagia 3 (mechanical soft);Nectar-thick liquid Liquids provided via: Cup;No straw Medication Administration: Crushed with puree Supervision: Staff to assist with self feeding;Full supervision/cueing for compensatory strategies Compensations: Slow rate;Small sips/bites;Follow solids with liquid Postural Changes and/or Swallow Maneuvers: Seated upright 90 degrees                Oral Care Recommendations: Oral care BID Follow up Recommendations: Skilled Nursing facility SLP Visit Diagnosis: Dysphagia, oropharyngeal phase (R13.12) Plan: Continue with current plan of care       [place] D[place] I. Hardin Negus, [place], [place] Office number [phone] Pager [place] 01/31/2021, 3:27 PM

## 2021-02-01 DIAGNOSIS — S72401A Unspecified fracture of lower end of right femur, initial encounter for closed fracture: Secondary | ICD-10-CM | POA: Diagnosis not present

## 2021-02-01 DIAGNOSIS — S72351A Displaced comminuted fracture of shaft of right femur, initial encounter for closed fracture: Secondary | ICD-10-CM | POA: Diagnosis not present

## 2021-02-01 DIAGNOSIS — S42202A Unspecified fracture of upper end of left humerus, initial encounter for closed fracture: Secondary | ICD-10-CM | POA: Diagnosis not present

## 2021-02-01 DIAGNOSIS — W19XXXD Unspecified fall, subsequent encounter: Secondary | ICD-10-CM | POA: Diagnosis not present

## 2021-02-01 DIAGNOSIS — D638 Anemia in other chronic diseases classified elsewhere: Secondary | ICD-10-CM

## 2021-02-01 LAB — BASIC METABOLIC PANEL
Anion gap: 10 (ref 5–15)
BUN: 43 mg/dL — ABNORMAL HIGH (ref 8–23)
CO2: 23 mmol/L (ref 22–32)
Calcium: 8.1 mg/dL — ABNORMAL LOW (ref 8.9–10.3)
Chloride: 107 mmol/L (ref 98–111)
Creatinine, Ser: 1.43 mg/dL — ABNORMAL HIGH (ref 0.44–1.00)
GFR, Estimated: 35 mL/min — ABNORMAL LOW (ref >60)
Glucose, Bld: 101 mg/dL — ABNORMAL HIGH (ref 70–99)
Potassium: 4 mmol/L (ref 3.5–5.1)
Sodium: 140 mmol/L (ref 135–145)

## 2021-02-01 LAB — CBC
HCT: 22.5 % — ABNORMAL LOW (ref 36.0–46.0)
Hemoglobin: 7.1 g/dL — ABNORMAL LOW (ref 12.0–15.0)
MCH: 30.7 pg (ref 26.0–34.0)
MCHC: 31.6 g/dL (ref 30.0–36.0)
MCV: 97.4 fL (ref 80.0–100.0)
Platelets: 177 10*3/uL (ref 150–400)
RBC: 2.31 MIL/uL — ABNORMAL LOW (ref 3.87–5.11)
RDW: 14.1 % (ref 11.5–15.5)
WBC: 6.2 10*3/uL (ref 4.0–10.5)
nRBC: 0 % (ref 0.0–0.2)

## 2021-02-01 LAB — PREPARE RBC (CROSSMATCH)

## 2021-02-01 MED ORDER — SODIUM CHLORIDE 0.9% IV SOLUTION: Freq: Once | INTRAVENOUS | Status: DC

## 2021-02-01 MED ORDER — FUROSEMIDE 10 MG/ML IJ SOLN
20.0000 mg | Freq: Once | INTRAMUSCULAR | Status: AC
  Administered 2021-02-01: 20 mg via INTRAVENOUS
  Filled 2021-02-01: qty 2

## 2021-02-01 NOTE — Plan of Care (Signed)
?  Problem: Education: ?Goal: Knowledge of General Education information will improve ?Description: Including pain rating scale, medication(s)/side effects and non-pharmacologic comfort measures ?Outcome: Progressing ?  ?Problem: Health Behavior/Discharge Planning: ?Goal: Ability to manage health-related needs will improve ?Outcome: Progressing ?  ?Problem: Activity: ?Goal: Risk for activity intolerance will decrease ?Outcome: Progressing ?  ?Problem: Nutrition: ?Goal: Adequate nutrition will be maintained ?Outcome: Progressing ?  ?Problem: Skin Integrity: ?Goal: Risk for impaired skin integrity will decrease ?Outcome: Progressing ?  ?

## 2021-02-01 NOTE — TOC Progression Note (Signed)
Transition of Care [facility]) - Progression Note    Patient Details  Name: [name] MRN: [id number] Date of Birth: [birth year]  Transition of Care [facility]) CM/SW [place], [place] Phone Number: 02/01/2021, 9:06 AM  Clinical Narrative:    CSW spoke with RN Louie Casa and physician due to pt not being medically stable for discharge this am.  Pt will discharge tomorrow morning to [facility].  Pt's son has been updated on changes.  TOC team will continue to assist with disposition planning.   Expected Discharge Plan: Skilled Nursing Facility Barriers to Discharge: No SNF bed  Expected Discharge Plan and Services Expected Discharge Plan: [place]         Expected Discharge Date: 01/31/21                                     Social Determinants of Health (SDOH) Interventions    Readmission Risk Interventions No flowsheet data found.

## 2021-02-01 NOTE — Progress Notes (Signed)
Physician Discharge Summary [facility]    Patient: [name]                   Admit date: 01/27/2021   DOB: 1931-12-10             Discharge date:02/01/2021/11:37 AM FIE:[id number]                          PCP: Prince Solian, MD  Disposition: SNF   Recommendations for Outpatient Follow-up:   . Follow with orthopedic surgical team up: in 2 weeks  Discharge Condition: Stable   Code Status:   Code Status: Full Code  Diet recommendation: Cardiac diet -aspiration precaution-please see speech recommendation for consistency of diet.     Patient was initially discharged to SNF yesterday -patient had to be held back for speech evaluation, now blood transfusions 2 units. If remains stable will be discharged in a.m. to skilled nursing facility.  The plan was discussed with the patient and her son at the bedside in detail     Discharge Diagnoses:    Principal Problem:   Closed fracture of right distal femur ([place]) Active Problems:   HTN (hypertension)   Bradycardia   RBBB   Closed fracture of left humerus   Delirium   History of Present Illness/ Hospital Course [name] Summary:    [name] This is an 87 year old female with history significant for hypertension as well as history of chronic left humeral fracture who presented to the ED after a mechanical fall at home in the setting of hallucinations and confusion. She was noted to have a right distal acute femoral fracture as well as noted chronic left humeral fracture. Family members were concerned about UTI which it appears that she has, and therefore, has been started on Rocephin empirically with urine cultures pending.    Closed fracture of right distal femur - s/p repair on 01/28/21 -Continue plan per orthopedic surgery -Although the patient states that she would like to go home, the patient's son states that he is unable to care for her in this condition and prefers she goes to skilled nursing until  she is more mobile  Active Problems: Acute blood loss anemia -Secondary to above-mentioned fracture -2/9> Hemoglobin dropped from 9.8-6.6 -1 unit PRBC transfusion ordered-  Hemoglobin 7.9, 7.4, >>7.1   the patient and his son agreed to transfuse 2 units of PRBC before discharge  Dementia with acute delirium -Suspected to have delirium secondary to urinary tract infection -According to the son the patient does have dementia at baseline -Continue Depakote and Aricept -Possible exacerbated by sundowning, small dose of Seroquel to be continued as needed  Pain control - of right hip after fracture repair - d/c Hydrocodone as the son is concerned it increased her confusion - add APAP 1 gm TID and Ibuprofen 200 mg QID and try to avoid Morphine if possible   Poor oral intake - in setting of confusion and then sedation- follow oral intake- not stable to leave the hospital until we see her eating and drinking   CKD 3a -Cr rose from 1.02 to 1.37 yesterday (possibly due to acute blood loss) -Recommending oral hydration, monitoring closely  Dysphagia-   Hiatal hernia -Speech was consulted, status post evaluation -Speech recommendation: Diet recommendations: Dysphagia 3 (mechanical soft);Nectar-thick liquid Liquids provided via: Cup;No straw Medication Administration: Crushed with puree Supervision: Staff to assist with self feeding;Full supervision/cueing for compensatory strategies Compensations: Slow  rate;Small sips/bites;Follow solids with liquid Postural Changes and/or Swallow Maneuvers: Seated upright 90 degrees  UTI - has been given ceftriaxone x 3 days - cultures showing multiple morphotypes  Hypotension with a history of HTN (hypertension) -BP dropped as low as 88/36 this morning-likely secondary to acute blood loss -Discontinued hydrochlorothiazide and  Avapro -Blood pressure stabilized 129/51 this morning    Closed fracture of left humerus -Chronic -Patient son  would like to speak to her primary orthopedic physician -Recommended close follow-up    DVT prophylaxis:  Continue SCDS, compression stockings Code Status: Full code Family Communication: Son at the bedside-plan of care was discussed and agreed to blood transfusion 2 units today  Disposition Plan:  Status is: Inpatient     Dispo: The patient is from: Home  Anticipated d/c is to: SNF--in the morning 12/02/2021     Nutritional status:  Nutrition Problem: Increased nutrient needs Etiology: post-op healing Signs/Symptoms: estimated needs Interventions: Ensure Enlive (each supplement provides 350kcal and 20 grams of protein),MVI  The patient's BMI is: Body mass index is 31.74 kg/m. I agree with the assessment and plan as outlined below:    Discharge Instructions:   Discharge Instructions    Activity as tolerated - No restrictions   Complete by: As directed    Call MD for:  difficulty breathing, headache or visual disturbances   Complete by: As directed    Call MD for:  persistant dizziness or light-headedness   Complete by: As directed    Call MD for:  redness, tenderness, or signs of infection (pain, swelling, redness, odor or green/yellow discharge around incision site)   Complete by: As directed    Call MD for:  temperature >100.4   Complete by: As directed    Diet - low sodium heart healthy   Complete by: As directed    Discharge instructions   Complete by: As directed    Please follow-up with the orthopedic team... Regarding further evaluation hip wound and shoulder fracture   Discharge wound care:   Complete by: As directed    Per recommendation from orthopedic surgical team   Increase activity slowly   Complete by: As directed       Contact information for after-discharge care    Destination    [facility] .   Service: Skilled Nursing Contact information: [address] [place] [phone]                 No Known Allergies   Procedures /Studies:   CT Head Wo Contrast  Result Date: 01/27/2021 CLINICAL DATA:  Head trauma, minor. Additional history provided: Fall, neck pain. EXAM: CT HEAD WITHOUT CONTRAST TECHNIQUE: Contiguous axial images were obtained from the base of the skull through the vertex without intravenous contrast. COMPARISON:  No pertinent prior exams available for comparison. FINDINGS: Brain: Mild cerebral atrophy. There is no acute intracranial hemorrhage. No demarcated cortical infarct. No extra-axial fluid collection. No evidence of intracranial mass. No midline shift. Vascular: No hyperdense vessel.  Atherosclerotic calcifications. Skull: Normal. Negative for fracture or focal lesion. Sinuses/Orbits: Visualized orbits show no acute finding. No significant paranasal sinus disease. IMPRESSION: No evidence of acute intracranial abnormality. Mild cerebral atrophy. Electronically Signed   By: Kellie Simmering DO   On: 01/27/2021 17:40   CT Cervical Spine Wo Contrast  Result Date: 01/27/2021 CLINICAL DATA:  Neck trauma.  Fall/neck pain. EXAM: CT CERVICAL SPINE WITHOUT CONTRAST TECHNIQUE: Multidetector CT imaging  of the cervical spine was performed without intravenous contrast. Multiplanar CT image reconstructions were also generated. COMPARISON:  Chest radiographs 01/07/2010. FINDINGS: Alignment: Trace C4-C5 grade 1 retrolisthesis. 2 mm C7-T1 grade 1 anterolisthesis. Trace T2-T3 grade 1 anterolisthesis. Skull base and vertebrae: The basion-dental and atlanto-dental intervals are maintained.No evidence of acute fracture to the cervical spine. Mild age-indeterminate T2 superior endplate compression deformity with superimposed Schmorl node. Age-indeterminate T3 vertebral compression deformity (30-40% height loss). Soft tissues and spinal canal: No prevertebral fluid or swelling. No visible canal hematoma. Disc levels: Cervical spondylosis with  multilevel disc space narrowing, disc bulges, posterior disc osteophytes, uncovertebral hypertrophy and facet arthrosis. Disc space narrowing is moderate/advanced at C4-C5 and C6-C7. Additionally, small central disc protrusions are present at C2-C3 and C3-C4. Facet joint ankylosis on the left at C5-C6. Facet joint ankylosis is also suspected bilaterally at T3-T4. Upper chest: No consolidation within the imaged lung apices. No visible pneumothorax. IMPRESSION: 1. No evidence of acute fracture to the cervical spine. 2. Age-indeterminate T2 and T3 vertebral compression deformities, as described. 3. Mild C4-C5 grade 1 retrolisthesis. 4. Mild C7-T1 and T2-T3 grade 1 anterolisthesis. 5. Cervical spondylosis, as described. 6. Levels of facet joint ankylosis within the cervical and visualized upper thoracic spine. Electronically Signed   By: Kellie Simmering DO   On: 01/27/2021 17:54   CT Abdomen Pelvis W Contrast  Result Date: 01/27/2021 CLINICAL DATA:  [place] from standing EXAM: CT ABDOMEN AND PELVIS WITH CONTRAST TECHNIQUE: Multidetector CT imaging of the abdomen and pelvis was performed using the standard protocol following bolus administration of intravenous contrast. CONTRAST:  151mL OMNIPAQUE IOHEXOL 300 MG/ML  SOLN COMPARISON:  None. FINDINGS: Lower chest: No acute pleural or parenchymal lung disease. Hepatobiliary: No focal liver abnormality is seen. Status post cholecystectomy. No biliary dilatation. Pancreas: Unremarkable. No pancreatic ductal dilatation or surrounding inflammatory changes. Spleen: Normal in size without focal abnormality. Adrenals/Urinary Tract: Adrenal glands are unremarkable. Kidneys are normal, without renal calculi, focal lesion, or hydronephrosis. Bladder is unremarkable. Stomach/Bowel: No bowel obstruction or ileus. No bowel wall thickening or inflammatory change. Vascular/Lymphatic: There is ectasia of the thoracoabdominal aorta without aneurysm. Diffuse atherosclerosis. No pathologic  adenopathy. Surgical clips along the left pelvic sidewall and retroperitoneum consistent with lymphadenectomy. Reproductive: Status post hysterectomy. No adnexal masses. Other: No free fluid or free gas. Midline supraumbilical ventral hernia contains a portion of the transverse colon, without bowel obstruction or strangulation. There is a small fat containing umbilical hernia, as well as bilateral fat containing inguinal hernias. Musculoskeletal: There is a chronic L1 compression deformity with vertebra plana and evidence of prior vertebral augmentation. Chronic T11 compression deformity also noted, approximately 50% loss of height. No acute displaced fractures. Reconstructed images demonstrate no additional findings. IMPRESSION: 1. Midline supraumbilical ventral hernia contains a portion of the transverse colon, without bowel obstruction or strangulation. 2. Small fat containing umbilical and bilateral inguinal hernias. 3. Chronic T11 and L1 compression fractures as above. Electronically Signed   By: Randa Ngo M.D.   On: 01/27/2021 20:37   DG Pelvis Portable  Result Date: 01/27/2021 CLINICAL DATA:  [place] EXAM: PORTABLE PELVIS 1-2 VIEWS COMPARISON:  None. FINDINGS: Supine frontal view of the pelvis was obtained, limited by positioning and collimation. The left hemipelvis and left hip are excluded by collimation. I do not see any acute fracture within the visualized portions of the pelvis or right hip. Soft tissues are unremarkable. IMPRESSION: 1. No acute pelvic fracture on this limited evaluation. Electronically Signed   By:  Randa Ngo M.D.   On: 01/27/2021 16:22   DG Chest Port 1 View  Result Date: 01/27/2021 CLINICAL DATA:  87 year old female with fall. EXAM: PORTABLE CHEST 1 VIEW COMPARISON:  Chest radiograph dated 06/07/2010. FINDINGS: No focal consolidation, pleural effusion or pneumothorax. Stable cardiac silhouette. Atherosclerotic calcification of the aorta. Osteopenia with degenerative  changes of the spine. Old right posterior rib fractures. Left humeral neck fracture better seen on the upper extremity radiograph. Partially visualized left upper extremity hardware. IMPRESSION: 1. No active cardiopulmonary disease. 2. Fracture of the left humeral neck. Electronically Signed   By: Anner Crete M.D.   On: 01/27/2021 16:16   DG Knee Right Port  Result Date: 01/27/2021 CLINICAL DATA:  [place] EXAM: PORTABLE RIGHT KNEE - 1-2 VIEW COMPARISON:  None. FINDINGS: Frontal and lateral views of the right knee are obtained. There is a comminuted oblique fracture of the distal right femur, with approximately 15 mm of ventral displacement of the distal fracture fragment. The fracture line extends to the trochlear groove of the patellofemoral articulation, consistent with intra-articular fracture. There is a large joint effusion. Diffuse soft tissue edema. IMPRESSION: 1. Intra-articular comminuted fracture of the distal right femur, with 15 mm of ventral displacement of the distal fracture fragment. 2. Large joint effusion. Electronically Signed   By: Randa Ngo M.D.   On: 01/27/2021 16:20   DG Humerus Left  Result Date: 01/27/2021 CLINICAL DATA:  87 year old female with fall. EXAM: LEFT HUMERUS - 2+ VIEW COMPARISON:  Chest radiograph dated 01/27/2021. FINDINGS: There is a mildly displaced fracture humeral neck of the left with approximately 4 mm medial displacement of the distal fracture fragment. There is fracture of the distal third of the left humeral diaphysis with displacement of the distal fracture fragment and internal fixation hardware. There is advanced osteopenia. The soft tissues are grossly unremarkable. IMPRESSION: 1. Mildly displaced fracture of the left humeral neck. 2. Displaced fracture of the distal humeral diaphysis with displacement of the internal fixation hardware. Electronically Signed   By: Anner Crete M.D.   On: 01/27/2021 16:20   DG Swallowing Func-Speech  Pathology  Result Date: 01/31/2021 Objective Swallowing Evaluation: Type of Study: MBS-Modified Barium Swallow Study  Patient Details Name: DYSTANY DUFFY MRN: [id number] Date of Birth: 05-12-1931 Today's Date: 01/31/2021 Time: SLP Start Time (ACUTE ONLY): 1330 -SLP Stop Time (ACUTE ONLY): 1345 SLP Time Calculation (min) (ACUTE ONLY): 15 min Past Medical History: Past Medical History: Diagnosis Date . Anemia  . Dementia ([place])  . GERD (gastroesophageal reflux disease)  . Hiatal hernia  . Hyperlipidemia  . Hypertension  . Hypothyroidism  . Macular degeneration  . Obesity  . Osteoporosis  . Ovarian cancer ([place])   Stage IA grade 2 ovarian cancer . Seasonal allergies  Past Surgical History: Past Surgical History: Procedure Laterality Date . CHOLECYSTECTOMY, LAPAROSCOPIC   . colectomy  2005   Rectosigmoid colectomy with reanastomosis . ELBOW ARTHROPLASTY    replacing the humeral stem . KYPHOPLASTY N/A 03/02/2013  Procedure: KYPHOPLASTY;  Surgeon: Erline Levine, MD;  Location: [facility];  Service: Neurosurgery;  Laterality: N/A;  Lumbar One Kyphoplasty . ORIF FEMUR FRACTURE Right 01/28/2021  Procedure: OPEN REDUCTION INTERNAL FIXATION (ORIF) DISTAL FEMUR FRACTURE;  Surgeon: Altamese [place], MD;  Location: [facility];  Service: Orthopedics;  Laterality: Right; . OTHER SURGICAL HISTORY   . OTHER SURGICAL HISTORY    Ovarian cancer resection and staging HPI: Pt is an 87 y.o. female with PMH includes HTN, prior L elbow replacement,  obesity, osteoporosis, dementia. Pt was admitted 01/27/21 with AMS after sustaining  a fall. Imaging revealed R distal femur fx, L humeral head and midshift fx, age indeterminate T2-3 compression deformities. S/p R femur ORIF 2/8. SLP consulted due to reports from patient's son, that the "patient sometimes coughs when drinking thin liquids and coughs in her sleep especially when she is laying flat in the bed. Son believes this is secondary to hiatal hernia-he does state that the coughing is worse when she uses  a straw to drink liquids" Aspiration is therefore suspected by referring MD.  No data recorded Assessment / Plan / Recommendation CHL IP CLINICAL IMPRESSIONS 01/31/2021 Clinical Impression Pt presents with oropharynegal dysphagia characterized by impaired bolus cohesion, reduced lingual retraction, and reduced anterior laryngeal movement. Pt demonstrated premature spillage to the pyriform sinuses and incomplete epiglottic inversion which resulted in penetration (PAS 5) and eventual aspiration (PAS 7) of thin liquids. Aspiration consistently resulted in throat clearing which was ineffective. Prompted coughing was inconsistently effective in expelling the aspirate. Some airway protection was achieved by approximation of the arytenoids to the laryungeal surface of the epigllotis with thin liquids and epiglottic inversion was improved with thicker consistencies and with solids. Mild to moderate vallecular and pyriform sinus residue was noted accross consistencies, but the amount of residue increased with bolus size and with advancement of solids. Postural modifications were attempted to reduce aspiration risk, but pt was unable to demonstrate/maintain these positions despite cues. A dysphagia 3 diet with nectar thick liquids is recommended at this time. SLP will continue to follow pt and continued SLP services are recommended following discharge to target dysphagia. SLP Visit Diagnosis Dysphagia, oropharyngeal phase (R13.12) Attention and concentration deficit following -- Frontal lobe and executive function deficit following -- Impact on safety and function Mild aspiration risk;Moderate aspiration risk   CH[facility] IP TREATMENT RECOMMENDATION 01/31/2021 Treatment Recommendations Therapy as outlined in treatment plan below   Prognosis 01/31/2021 Prognosis for Safe Diet Advancement Good Barriers to Reach Goals Cognitive deficits Barriers/Prognosis Comment -- CHL IP DIET RECOMMENDATION 01/31/2021 SLP Diet Recommendations Dysphagia 3  (Mech soft) solids;Nectar thick liquid Liquid Administration via Cup;No straw Medication Administration Whole meds with puree Compensations Slow rate;Small sips/bites;Follow solids with liquid Postural Changes Remain semi-upright after after feeds/meals (Comment);Seated upright at 90 degrees   CHL IP OTHER RECOMMENDATIONS 01/31/2021 Recommended Consults -- Oral Care Recommendations Oral care BID Other Recommendations Order thickener from pharmacy   CH[facility] IP FOLLOW UP RECOMMENDATIONS 01/31/2021 Follow up Recommendations Skilled Nursing facility   [facility] IP FREQUENCY AND DURATION 01/31/2021 Speech Therapy Frequency (ACUTE ONLY) min 2x/week Treatment Duration 2 weeks      CHL IP ORAL PHASE 01/31/2021 Oral Phase Impaired Oral - Pudding Teaspoon -- Oral - Pudding Cup -- Oral - Honey Teaspoon -- Oral - Honey Cup -- Oral - Nectar Teaspoon -- Oral - Nectar Cup Decreased bolus cohesion;Premature spillage Oral - Nectar Straw Decreased bolus cohesion;Premature spillage Oral - Thin Teaspoon -- Oral - Thin Cup Decreased bolus cohesion;Premature spillage Oral - Thin Straw Decreased bolus cohesion;Premature spillage Oral - Puree Decreased bolus cohesion;Premature spillage Oral - Mech Soft -- Oral - Regular WFL Oral - Multi-Consistency -- Oral - Pill -- Oral Phase - Comment --  CHL IP PHARYNGEAL PHASE 01/31/2021 Pharyngeal Phase Impaired Pharyngeal- Pudding Teaspoon -- Pharyngeal -- Pharyngeal- Pudding Cup -- Pharyngeal -- Pharyngeal- Honey Teaspoon -- Pharyngeal -- Pharyngeal- Honey Cup -- Pharyngeal -- Pharyngeal- Nectar Teaspoon -- Pharyngeal -- Pharyngeal- Nectar Cup Pharyngeal residue - valleculae;Pharyngeal residue -  pyriform;Reduced anterior laryngeal mobility;Reduced epiglottic inversion;Reduced tongue base retraction Pharyngeal -- Pharyngeal- Nectar Straw Pharyngeal residue - valleculae;Pharyngeal residue - pyriform;Reduced anterior laryngeal mobility;Reduced epiglottic inversion;Reduced tongue base retraction Pharyngeal --  Pharyngeal- Thin Teaspoon -- Pharyngeal -- Pharyngeal- Thin Cup Pharyngeal residue - valleculae;Pharyngeal residue - pyriform;Reduced anterior laryngeal mobility;Reduced epiglottic inversion;Reduced tongue base retraction;Penetration/Aspiration during swallow;Penetration/Apiration after swallow Pharyngeal Material enters airway, CONTACTS cords and then ejected out;Material enters airway, passes BELOW cords and not ejected out despite cough attempt by patient Pharyngeal- Thin Straw Pharyngeal residue - valleculae;Pharyngeal residue - pyriform;Reduced anterior laryngeal mobility;Reduced epiglottic inversion;Reduced tongue base retraction;Penetration/Aspiration during swallow;Penetration/Apiration after swallow Pharyngeal Material enters airway, CONTACTS cords and then ejected out;Material enters airway, passes BELOW cords and not ejected out despite cough attempt by patient Pharyngeal- Puree Pharyngeal residue - valleculae;Pharyngeal residue - pyriform;Reduced anterior laryngeal mobility;Reduced epiglottic inversion;Reduced tongue base retraction Pharyngeal -- Pharyngeal- Mechanical Soft -- Pharyngeal -- Pharyngeal- Regular Pharyngeal residue - valleculae;Pharyngeal residue - pyriform;Reduced anterior laryngeal mobility;Reduced epiglottic inversion;Reduced tongue base retraction Pharyngeal -- Pharyngeal- Multi-consistency -- Pharyngeal -- Pharyngeal- Pill Pharyngeal residue - valleculae;Pharyngeal residue - pyriform;Reduced anterior laryngeal mobility;Reduced epiglottic inversion;Reduced tongue base retraction Pharyngeal -- Pharyngeal Comment --  CHL IP CERVICAL ESOPHAGEAL PHASE 01/31/2021 Cervical Esophageal Phase WFL Pudding Teaspoon -- Pudding Cup -- Honey Teaspoon -- Honey Cup -- Nectar Teaspoon -- Nectar Cup -- Nectar Straw -- Thin Teaspoon -- Thin Cup -- Thin Straw -- Puree -- Mechanical Soft -- Regular -- Multi-consistency -- Pill -- Cervical Esophageal Comment -- Shanika I. Hardin Negus, [place], [place] Office number [phone] Pager [phone] Horton Marshall 01/31/2021, 3:14 PM              DG C-Arm 1-60 Min  Result Date: 01/28/2021 CLINICAL DATA:  Trauma.  ORIF of right distal femoral fracture. EXAM: RIGHT FEMUR 2 VIEWS; DG C-ARM 1-60 MIN COMPARISON:  January 27, 2021. FINDINGS: Fluoro time: 1 minutes 27 seconds. Reported radiation: 10.68 mGy. Nine C-arm fluoroscopic images were obtained intraoperatively and submitted for post operative interpretation. These images demonstrate surgical changes associated with intramedullary rod and screw fixation of a distal femoral metadiaphyseal fracture. Final images demonstrate improved, near anatomic alignment. Please see the performing provider's procedural report for further detail. IMPRESSION: Intraoperative fluoroscopic imaging, as detailed above. Electronically Signed   By: Margaretha Sheffield MD   On: 01/28/2021 13:57   DG FEMUR, MIN 2 VIEWS RIGHT  Result Date: 01/28/2021 CLINICAL DATA:  Trauma.  ORIF of right distal femoral fracture. EXAM: RIGHT FEMUR 2 VIEWS; DG C-ARM 1-60 MIN COMPARISON:  January 27, 2021. FINDINGS: Fluoro time: 1 minutes 27 seconds. Reported radiation: 10.68 mGy. Nine C-arm fluoroscopic images were obtained intraoperatively and submitted for post operative interpretation. These images demonstrate surgical changes associated with intramedullary rod and screw fixation of a distal femoral metadiaphyseal fracture. Final images demonstrate improved, near anatomic alignment. Please see the performing provider's procedural report for further detail. IMPRESSION: Intraoperative fluoroscopic imaging, as detailed above. Electronically Signed   By: Margaretha Sheffield MD   On: 01/28/2021 13:57   DG Femur Min 2 Views Right  Result Date: 01/27/2021 CLINICAL DATA:  Distal right femur pain and deformity post fall EXAM: RIGHT FEMUR 2 VIEWS COMPARISON:  None. FINDINGS: There is a comminuted oblique fracture of the distal femoral  metadiaphysis with approximately 1 shaft with of medial displacement and impaction of the distal fracture fragment. IMPRESSION: Comminuted displaced oblique fracture of the distal femoral metadiaphysis. Electronically Signed   By: Dahlia Bailiff MD  On: 01/27/2021 18:09   DG FEMUR PORT, MIN 2 VIEWS RIGHT  Result Date: 01/28/2021 CLINICAL DATA:  Status post ORIF of distal right femoral fracture EXAM: RIGHT FEMUR PORTABLE 2 VIEW COMPARISON:  Intraoperative films from earlier in the same day. FINDINGS: Medullary rod is noted with proximal and distal fixation screws. Fracture fragments are in near anatomic alignment. No soft tissue abnormality is seen. IMPRESSION: Status post ORIF of mid to distal right femoral fracture. Electronically Signed   By: Inez Catalina M.D.   On: 01/28/2021 16:45    Subjective:   Patient was seen and examined 02/01/2021, 11:37 AM Patient stable today. No acute distress.  No issues overnight Stable for discharge.  Discharge Exam:    Vitals:   02/01/21 0416 02/01/21 0826 02/01/21 1038 02/01/21 1115  BP: (!) 110/53 (!) 146/47 (!) 129/105 (!) 148/99  Pulse: 69 64 62 64  Resp: 15 16 16 16   Temp: (!) 97.4 F (36.3 C) 98 F (36.7 C) (!) 97.4 F (36.3 C) 97.7 F (36.5 C)  TempSrc: Oral Oral Oral Oral  SpO2: 93% 97% 96% 94%  Weight:      Height:          Physical Exam:   General:  Alert, oriented, cooperative, no distress; mildly lethargic  HEENT:  Normocephalic, PERRL, otherwise with in Normal limits   Neuro:  CNII-XII intact. , normal motor and sensation, reflexes intact   Lungs:   Clear to auscultation BL, Respirations unlabored, no wheezes / crackles  Cardio:    S1/S2, RRR, No murmure, No Rubs or Gallops   Abdomen:   Soft, non-tender, bowel sounds active all four quadrants,  no guarding or peritoneal signs.  Muscular skeletal:   Generalized weaknesses , Limited exam - in bed, able to move all 4 extremities, Normal strength,  2+ pulses,  symmetric, No pitting  edema  Skin:  Dry, warm to touch, negative for any Rashes,  Wounds: Please see nursing documentation          The results of significant diagnostics from this hospitalization (including imaging, microbiology, ancillary and laboratory) are listed below for reference.      Microbiology:   Recent Results (from the past 240 hour(s))  SARS CORONAVIRUS 2 (TAT 6-24 HRS) Nasopharyngeal Nasopharyngeal Swab     Status: None   Collection Time: 01/27/21  7:47 PM   Specimen: Nasopharyngeal Swab  Result Value Ref Range Status   SARS Coronavirus 2 NEGATIVE NEGATIVE Final    Comment: (NOTE) SARS-CoV-2 target nucleic acids are NOT DETECTED.  The SARS-CoV-2 RNA is generally detectable in upper and lower respiratory specimens during the acute phase of infection. Negative results do not preclude SARS-CoV-2 infection, do not rule out co-infections with other pathogens, and should not be used as the sole basis for treatment or other patient management decisions. Negative results must be combined with clinical observations, patient history, and epidemiological information. The expected result is Negative.  Fact Sheet for Patients: SugarRoll.be  Fact Sheet for Healthcare Providers: https://www.woods-mathews.com/  This test is not yet approved or cleared by the [place] FDA and  has been authorized for detection and/or diagnosis of SARS-CoV-2 by FDA under an Emergency Use Authorization (EUA). This EUA will remain  in effect (meaning this test can be used) for the duration of the COVID-19 declaration under Se ction 564(b)(1) of the Act, 21 U.S.C. section 360bbb-3(b)(1), unless the authorization is terminated or revoked sooner.  Performed at [facility], [place],  [place] [address]   Urine culture     Status: Abnormal   Collection Time: 01/27/21  7:54 PM   Specimen: Urine, Random  Result Value Ref Range Status   Specimen  Description URINE, RANDOM  Final   Special Requests   Final    NONE Performed at [facility], [place] [address], [place], [place] [address]    Culture (A)  Final    >=100,000 COLONIES/mL MULTIPLE SPECIES PRESENT, SUGGEST RECOLLECTION   Report Status 01/29/2021 FINAL  Final  SARS CORONAVIRUS 2 (TAT 6-24 HRS) Nasopharyngeal Nasopharyngeal Swab     Status: None   Collection Time: 01/31/21 10:19 AM   Specimen: Nasopharyngeal Swab  Result Value Ref Range Status   SARS Coronavirus 2 NEGATIVE NEGATIVE Final    Comment: (NOTE) SARS-CoV-2 target nucleic acids are NOT DETECTED.  The SARS-CoV-2 RNA is generally detectable in upper and lower respiratory specimens during the acute phase of infection. Negative results do not preclude SARS-CoV-2 infection, do not rule out co-infections with other pathogens, and should not be used as the sole basis for treatment or other patient management decisions. Negative results must be combined with clinical observations, patient history, and epidemiological information. The expected result is Negative.  Fact Sheet for Patients: SugarRoll.be  Fact Sheet for Healthcare Providers: https://www.woods-mathews.com/  This test is not yet approved or cleared by the [place] FDA and  has been authorized for detection and/or diagnosis of SARS-CoV-2 by FDA under an Emergency Use Authorization (EUA). This EUA will remain  in effect (meaning this test can be used) for the duration of the COVID-19 declaration under Se ction 564(b)(1) of the Act, 21 U.S.C. section 360bbb-3(b)(1), unless the authorization is terminated or revoked sooner.  Performed at [facility], [place] [address] [place], [place] [address]      Labs:   CBC: Recent Labs  Lab 01/27/21 1710 01/27/21 2209 01/28/21 0752 01/29/21 0241 01/29/21 1355 01/30/21 0125 01/31/21 0109 02/01/21 0140  WBC 16.9*  --  7.8 6.9  --  7.7 7.1 6.2   NEUTROABS 14.1*  --   --   --   --   --   --   --   HGB 11.4*   < > 8.8* 6.6* 8.2* 7.9* 7.4* 7.1*  HCT 37.3   < > 27.9* 21.3* 25.6* 22.9* 23.9* 22.5*  MCV 97.1  --  96.5 96.8  --  93.1 98.0 97.4  PLT 208  --  157 114*  --  120* 128* 177   < > = values in this interval not displayed.   Basic Metabolic Panel: Recent Labs  Lab 01/27/21 1710 01/28/21 0752 01/29/21 0241 01/30/21 1220 02/01/21 0140  NA 139 138 138 140 140  K 4.1 4.0 4.1 4.2 4.0  CL 102 103 105 107 107  CO2 24 25 24 23 23   GLUCOSE 153* 112* 149* 130* 101*  BUN 20 20 24* 33* 43*  CREATININE 1.01* 1.02* 1.37* 1.53* 1.43*  CALCIUM 8.9 8.3* 8.0* 7.9* 8.1*  MG  --   --  1.8  --   --    Liver Function Tests: Recent Labs  Lab 01/27/21 1710  AST 23  ALT 21  ALKPHOS 50  BILITOT 0.7  PROT 6.1*  ALBUMIN 3.3*  Urinalysis    Component Value Date/Time   COLORURINE AMBER (A) 01/27/2021 1954   APPEARANCEUR HAZY (A) 01/27/2021 1954   LABSPEC 1.026 01/27/2021 1954   PHURINE 5.0 01/27/2021 1954   GLUCOSEU NEGATIVE  01/27/2021 1954   HGBUR MODERATE (A) 01/27/2021 1954   BILIRUBINUR NEGATIVE 01/27/2021 1954   KETONESUR 5 (A) 01/27/2021 1954   PROTEINUR 30 (A) 01/27/2021 1954   UROBILINOGEN 0.2 09/18/2015 1737   NITRITE NEGATIVE 01/27/2021 1954   LEUKOCYTESUR MODERATE (A) 01/27/2021 1954         Time coordinating discharge: Over 35 minutes  SIGNED: Deatra James, MD, FACP, [facility]. [facility],  Please use [email or web address] to Page If 7PM-7AM, please contact night-coverage [email or web address] [email or web address] 02/01/2021, 11:37 AM

## 2021-02-02 DIAGNOSIS — R4182 Altered mental status, unspecified: Secondary | ICD-10-CM | POA: Diagnosis not present

## 2021-02-02 DIAGNOSIS — S79929A Unspecified injury of unspecified thigh, initial encounter: Secondary | ICD-10-CM | POA: Diagnosis not present

## 2021-02-02 DIAGNOSIS — S72301D Unspecified fracture of shaft of right femur, subsequent encounter for closed fracture with routine healing: Secondary | ICD-10-CM | POA: Diagnosis not present

## 2021-02-02 DIAGNOSIS — R0602 Shortness of breath: Secondary | ICD-10-CM | POA: Diagnosis not present

## 2021-02-02 DIAGNOSIS — M79644 Pain in right finger(s): Secondary | ICD-10-CM | POA: Diagnosis not present

## 2021-02-02 DIAGNOSIS — I959 Hypotension, unspecified: Secondary | ICD-10-CM | POA: Diagnosis not present

## 2021-02-02 DIAGNOSIS — Z9981 Dependence on supplemental oxygen: Secondary | ICD-10-CM | POA: Diagnosis not present

## 2021-02-02 DIAGNOSIS — S72401A Unspecified fracture of lower end of right femur, initial encounter for closed fracture: Secondary | ICD-10-CM | POA: Diagnosis not present

## 2021-02-02 DIAGNOSIS — F419 Anxiety disorder, unspecified: Secondary | ICD-10-CM | POA: Diagnosis not present

## 2021-02-02 DIAGNOSIS — N189 Chronic kidney disease, unspecified: Secondary | ICD-10-CM | POA: Diagnosis not present

## 2021-02-02 DIAGNOSIS — S72451D Displaced supracondylar fracture without intracondylar extension of lower end of right femur, subsequent encounter for closed fracture with routine healing: Secondary | ICD-10-CM | POA: Diagnosis not present

## 2021-02-02 DIAGNOSIS — I120 Hypertensive chronic kidney disease with stage 5 chronic kidney disease or end stage renal disease: Secondary | ICD-10-CM | POA: Diagnosis not present

## 2021-02-02 DIAGNOSIS — E785 Hyperlipidemia, unspecified: Secondary | ICD-10-CM | POA: Diagnosis not present

## 2021-02-02 DIAGNOSIS — F29 Unspecified psychosis not due to a substance or known physiological condition: Secondary | ICD-10-CM | POA: Diagnosis not present

## 2021-02-02 DIAGNOSIS — S72401D Unspecified fracture of lower end of right femur, subsequent encounter for closed fracture with routine healing: Secondary | ICD-10-CM | POA: Diagnosis not present

## 2021-02-02 DIAGNOSIS — R410 Disorientation, unspecified: Secondary | ICD-10-CM | POA: Diagnosis not present

## 2021-02-02 DIAGNOSIS — D638 Anemia in other chronic diseases classified elsewhere: Secondary | ICD-10-CM

## 2021-02-02 DIAGNOSIS — M549 Dorsalgia, unspecified: Secondary | ICD-10-CM | POA: Diagnosis not present

## 2021-02-02 DIAGNOSIS — R443 Hallucinations, unspecified: Secondary | ICD-10-CM | POA: Diagnosis not present

## 2021-02-02 DIAGNOSIS — I4510 Unspecified right bundle-branch block: Secondary | ICD-10-CM | POA: Diagnosis not present

## 2021-02-02 DIAGNOSIS — K219 Gastro-esophageal reflux disease without esophagitis: Secondary | ICD-10-CM | POA: Diagnosis not present

## 2021-02-02 DIAGNOSIS — R0781 Pleurodynia: Secondary | ICD-10-CM | POA: Diagnosis not present

## 2021-02-02 DIAGNOSIS — F0390 Unspecified dementia without behavioral disturbance: Secondary | ICD-10-CM | POA: Diagnosis not present

## 2021-02-02 DIAGNOSIS — K449 Diaphragmatic hernia without obstruction or gangrene: Secondary | ICD-10-CM | POA: Diagnosis not present

## 2021-02-02 DIAGNOSIS — R059 Cough, unspecified: Secondary | ICD-10-CM | POA: Diagnosis present

## 2021-02-02 DIAGNOSIS — R079 Chest pain, unspecified: Secondary | ICD-10-CM | POA: Diagnosis not present

## 2021-02-02 DIAGNOSIS — R1310 Dysphagia, unspecified: Secondary | ICD-10-CM | POA: Diagnosis not present

## 2021-02-02 DIAGNOSIS — D500 Iron deficiency anemia secondary to blood loss (chronic): Secondary | ICD-10-CM | POA: Diagnosis not present

## 2021-02-02 DIAGNOSIS — K5900 Constipation, unspecified: Secondary | ICD-10-CM | POA: Diagnosis not present

## 2021-02-02 DIAGNOSIS — W19XXXD Unspecified fall, subsequent encounter: Secondary | ICD-10-CM | POA: Diagnosis not present

## 2021-02-02 DIAGNOSIS — R5381 Other malaise: Secondary | ICD-10-CM | POA: Diagnosis not present

## 2021-02-02 DIAGNOSIS — N1831 Chronic kidney disease, stage 3a: Secondary | ICD-10-CM | POA: Diagnosis not present

## 2021-02-02 DIAGNOSIS — Z741 Need for assistance with personal care: Secondary | ICD-10-CM | POA: Diagnosis not present

## 2021-02-02 DIAGNOSIS — M6281 Muscle weakness (generalized): Secondary | ICD-10-CM | POA: Diagnosis not present

## 2021-02-02 DIAGNOSIS — R58 Hemorrhage, not elsewhere classified: Secondary | ICD-10-CM | POA: Diagnosis not present

## 2021-02-02 DIAGNOSIS — R41841 Cognitive communication deficit: Secondary | ICD-10-CM | POA: Diagnosis not present

## 2021-02-02 DIAGNOSIS — R350 Frequency of micturition: Secondary | ICD-10-CM | POA: Diagnosis not present

## 2021-02-02 DIAGNOSIS — G300 Alzheimer's disease with early onset: Secondary | ICD-10-CM | POA: Diagnosis not present

## 2021-02-02 DIAGNOSIS — I10 Essential (primary) hypertension: Secondary | ICD-10-CM | POA: Diagnosis not present

## 2021-02-02 DIAGNOSIS — M810 Age-related osteoporosis without current pathological fracture: Secondary | ICD-10-CM | POA: Diagnosis not present

## 2021-02-02 DIAGNOSIS — Z96622 Presence of left artificial elbow joint: Secondary | ICD-10-CM | POA: Diagnosis not present

## 2021-02-02 DIAGNOSIS — F32A Depression, unspecified: Secondary | ICD-10-CM | POA: Diagnosis not present

## 2021-02-02 DIAGNOSIS — R279 Unspecified lack of coordination: Secondary | ICD-10-CM | POA: Diagnosis not present

## 2021-02-02 DIAGNOSIS — Z8543 Personal history of malignant neoplasm of ovary: Secondary | ICD-10-CM | POA: Diagnosis not present

## 2021-02-02 DIAGNOSIS — S72351A Displaced comminuted fracture of shaft of right femur, initial encounter for closed fracture: Secondary | ICD-10-CM | POA: Diagnosis not present

## 2021-02-02 DIAGNOSIS — R52 Pain, unspecified: Secondary | ICD-10-CM | POA: Diagnosis not present

## 2021-02-02 DIAGNOSIS — E039 Hypothyroidism, unspecified: Secondary | ICD-10-CM | POA: Diagnosis not present

## 2021-02-02 DIAGNOSIS — H3530 Unspecified macular degeneration: Secondary | ICD-10-CM | POA: Diagnosis not present

## 2021-02-02 DIAGNOSIS — F0280 Dementia in other diseases classified elsewhere without behavioral disturbance: Secondary | ICD-10-CM | POA: Diagnosis not present

## 2021-02-02 DIAGNOSIS — M542 Cervicalgia: Secondary | ICD-10-CM | POA: Diagnosis not present

## 2021-02-02 DIAGNOSIS — N390 Urinary tract infection, site not specified: Secondary | ICD-10-CM | POA: Diagnosis not present

## 2021-02-02 DIAGNOSIS — Z743 Need for continuous supervision: Secondary | ICD-10-CM | POA: Diagnosis not present

## 2021-02-02 DIAGNOSIS — F22 Delusional disorders: Secondary | ICD-10-CM | POA: Diagnosis not present

## 2021-02-02 DIAGNOSIS — S42302D Unspecified fracture of shaft of humerus, left arm, subsequent encounter for fracture with routine healing: Secondary | ICD-10-CM | POA: Diagnosis not present

## 2021-02-02 DIAGNOSIS — R001 Bradycardia, unspecified: Secondary | ICD-10-CM | POA: Diagnosis not present

## 2021-02-02 DIAGNOSIS — R1312 Dysphagia, oropharyngeal phase: Secondary | ICD-10-CM | POA: Diagnosis not present

## 2021-02-02 DIAGNOSIS — S42202A Unspecified fracture of upper end of left humerus, initial encounter for closed fracture: Secondary | ICD-10-CM | POA: Diagnosis not present

## 2021-02-02 DIAGNOSIS — F05 Delirium due to known physiological condition: Secondary | ICD-10-CM | POA: Diagnosis not present

## 2021-02-02 DIAGNOSIS — E559 Vitamin D deficiency, unspecified: Secondary | ICD-10-CM | POA: Diagnosis not present

## 2021-02-02 DIAGNOSIS — Z9181 History of falling: Secondary | ICD-10-CM | POA: Diagnosis not present

## 2021-02-02 DIAGNOSIS — Z7409 Other reduced mobility: Secondary | ICD-10-CM | POA: Diagnosis not present

## 2021-02-02 DIAGNOSIS — G8929 Other chronic pain: Secondary | ICD-10-CM | POA: Diagnosis not present

## 2021-02-02 DIAGNOSIS — J9601 Acute respiratory failure with hypoxia: Secondary | ICD-10-CM | POA: Diagnosis not present

## 2021-02-02 DIAGNOSIS — F3112 Bipolar disorder, current episode manic without psychotic features, moderate: Secondary | ICD-10-CM | POA: Diagnosis not present

## 2021-02-02 LAB — TYPE AND SCREEN
ABO/RH(D): A POS
Antibody Screen: NEGATIVE
Unit division: 0
Unit division: 0

## 2021-02-02 LAB — CBC
HCT: 30 % — ABNORMAL LOW (ref 36.0–46.0)
Hemoglobin: 9.8 g/dL — ABNORMAL LOW (ref 12.0–15.0)
MCH: 29.9 pg (ref 26.0–34.0)
MCHC: 32.7 g/dL (ref 30.0–36.0)
MCV: 91.5 fL (ref 80.0–100.0)
Platelets: 205 10*3/uL (ref 150–400)
RBC: 3.28 MIL/uL — ABNORMAL LOW (ref 3.87–5.11)
RDW: 18.5 % — ABNORMAL HIGH (ref 11.5–15.5)
WBC: 5.6 10*3/uL (ref 4.0–10.5)
nRBC: 0 % (ref 0.0–0.2)

## 2021-02-02 LAB — BASIC METABOLIC PANEL
Anion gap: 9 (ref 5–15)
BUN: 35 mg/dL — ABNORMAL HIGH (ref 8–23)
CO2: 27 mmol/L (ref 22–32)
Calcium: 8.3 mg/dL — ABNORMAL LOW (ref 8.9–10.3)
Chloride: 106 mmol/L (ref 98–111)
Creatinine, Ser: 1.04 mg/dL — ABNORMAL HIGH (ref 0.44–1.00)
GFR, Estimated: 52 mL/min — ABNORMAL LOW (ref >60)
Glucose, Bld: 102 mg/dL — ABNORMAL HIGH (ref 70–99)
Potassium: 4 mmol/L (ref 3.5–5.1)
Sodium: 142 mmol/L (ref 135–145)

## 2021-02-02 LAB — BPAM RBC
Blood Product Expiration Date: 202203072359
Blood Product Expiration Date: 202203072359
ISSUE DATE / TIME: 202202121057
ISSUE DATE / TIME: 202202121542
Unit Type and Rh: 6200
Unit Type and Rh: 6200

## 2021-02-02 MED ORDER — RIVAROXABAN 10 MG PO TABS: 10.0000 mg | ORAL_TABLET | Freq: Every day | ORAL | 1 refills | Status: AC

## 2021-02-02 NOTE — Plan of Care (Signed)
Problem: Increased Nutrient Needs (NI-5.1) Goal: Food and/or nutrient delivery Description: Individualized approach for food/nutrient provision. Outcome: Adequate for Discharge   Problem: Education: Goal: Knowledge of General Education information will improve Description: Including pain rating scale, medication(s)/side effects and non-pharmacologic comfort measures 02/02/2021 0759 by Phebe Colla, RN Outcome: Adequate for Discharge 02/02/2021 0759 by Phebe Colla, RN Outcome: Adequate for Discharge   Problem: Health Behavior/Discharge Planning: Goal: Ability to manage health-related needs will improve 02/02/2021 0759 by Phebe Colla, RN Outcome: Adequate for Discharge 02/02/2021 0759 by Phebe Colla, RN Outcome: Adequate for Discharge   Problem: Clinical Measurements: Goal: Ability to maintain clinical measurements within normal limits will improve 02/02/2021 0759 by Phebe Colla, RN Outcome: Adequate for Discharge 02/02/2021 0759 by Phebe Colla, RN Outcome: Adequate for Discharge Goal: Will remain free from infection 02/02/2021 0759 by Phebe Colla, RN Outcome: Adequate for Discharge 02/02/2021 0759 by Phebe Colla, RN Outcome: Adequate for Discharge Goal: Diagnostic test results will improve 02/02/2021 0759 by Phebe Colla, RN Outcome: Adequate for Discharge 02/02/2021 0759 by Phebe Colla, RN Outcome: Adequate for Discharge Goal: Respiratory complications will improve 02/02/2021 0759 by Phebe Colla, RN Outcome: Adequate for Discharge 02/02/2021 0759 by Phebe Colla, RN Outcome: Adequate for Discharge Goal: Cardiovascular complication will be avoided 02/02/2021 0759 by Phebe Colla, RN Outcome: Adequate for Discharge 02/02/2021 0759 by Phebe Colla, RN Outcome: Adequate for Discharge   Problem: Activity: Goal: Risk for activity intolerance will decrease 02/02/2021 0759 by  Phebe Colla, RN Outcome: Adequate for Discharge 02/02/2021 0759 by Phebe Colla, RN Outcome: Adequate for Discharge   Problem: Nutrition: Goal: Adequate nutrition will be maintained 02/02/2021 0759 by Phebe Colla, RN Outcome: Adequate for Discharge 02/02/2021 0759 by Phebe Colla, RN Outcome: Adequate for Discharge   Problem: Coping: Goal: Level of anxiety will decrease 02/02/2021 0759 by Phebe Colla, RN Outcome: Adequate for Discharge 02/02/2021 0759 by Phebe Colla, RN Outcome: Adequate for Discharge   Problem: Elimination: Goal: Will not experience complications related to bowel motility 02/02/2021 0759 by Phebe Colla, RN Outcome: Adequate for Discharge 02/02/2021 0759 by Phebe Colla, RN Outcome: Adequate for Discharge Goal: Will not experience complications related to urinary retention 02/02/2021 0759 by Phebe Colla, RN Outcome: Adequate for Discharge 02/02/2021 0759 by Phebe Colla, RN Outcome: Adequate for Discharge   Problem: Pain Managment: Goal: General experience of comfort will improve 02/02/2021 0759 by Phebe Colla, RN Outcome: Adequate for Discharge 02/02/2021 0759 by Phebe Colla, RN Outcome: Adequate for Discharge   Problem: Safety: Goal: Ability to remain free from injury will improve 02/02/2021 0759 by Phebe Colla, RN Outcome: Adequate for Discharge 02/02/2021 0759 by Phebe Colla, RN Outcome: Adequate for Discharge   Problem: Skin Integrity: Goal: Risk for impaired skin integrity will decrease 02/02/2021 0759 by Phebe Colla, RN Outcome: Adequate for Discharge 02/02/2021 0759 by Phebe Colla, RN Outcome: Adequate for Discharge   Problem: Acute Rehab OT Goals (only OT should resolve) Goal: Pt. Will Perform Grooming Outcome: Adequate for Discharge Goal: Pt. Will Perform Upper Body Bathing Outcome: Adequate for Discharge Goal: Pt. Will  Perform Upper Body Dressing Outcome: Adequate for Discharge Goal: Pt. Will Transfer To Toilet Outcome: Adequate for Discharge Goal: OT Additional ADL Goal #1 Outcome: Adequate for Discharge   Problem: Acute Rehab PT Goals(only PT should resolve) Goal: Pt Will Go Supine/Side To Sit Outcome: Adequate  for Discharge Goal: Pt Will Transfer Bed To Chair/Chair To Bed Outcome: Adequate for Discharge   Problem: SLP Dysphagia Goals Goal: Patient will utilize recommended strategies Description: Patient will utilize recommended strategies during swallow to increase swallowing safety with Outcome: Adequate for Discharge

## 2021-02-02 NOTE — Discharge Summary (Signed)
Physician Discharge Summary [facility]    Patient: [name]                   Admit date: 01/27/2021   DOB: [birth year]             Discharge date:02/02/2021/7:34 AM CBS:[id number]                          PCP: Prince Solian, MD  Disposition: SNF   Recommendations for Outpatient Follow-up:   . Follow with orthopedic surgical team up: in 2 weeks  Discharge Condition: Stable   Code Status:   Code Status: Full Code  Diet recommendation: Cardiac diet -aspiration precaution-please see speech recommendation for consistency of diet.     The patient was seen and examined this morning .  Ready for discharge  patient remained stable, this note was updated for discharge. Status post 2 units of PRBC transfusion hemoglobin has improved to 9.8 no signs of bleeding  Seems that orthopedic has added Xarelto for DVT prophylaxis (Soon as patient is mobile-we recommend that Xarelto be discontinued as patient is high risk for bleed-needs to follow-up with orthopedic team in the next 2 to 3 weeks)      Discharge Diagnoses:    Principal Problem:   Closed fracture of right distal femur ([place]) Active Problems:   HTN (hypertension)   Bradycardia   RBBB   Fracture of L1 vertebra ([place])   UTI (urinary tract infection)   Closed fracture of left humerus   Delirium   Anemia of chronic disease   History of Present Illness/ Hospital Course [name] Summary:    JMYA ULIANO This is an 87 year old female with history significant for hypertension as well as history of chronic left humeral fracture who presented to the ED after a mechanical fall at home in the setting of hallucinations and confusion. She was noted to have a right distal acute femoral fracture as well as noted chronic left humeral fracture. Family members were concerned about UTI which it appears that she has, and therefore, has been started on Rocephin empirically with urine cultures pending.    Closed fracture of  right distal femur - s/p repair on 01/28/21 -Continue plan per orthopedic surgery -Although the patient states that she would like to go home, the patient's son states that he is unable to care for her in this condition and prefers she goes to skilled nursing until she is more mobile -Orthopedic has added Xarelto for DVT prophylaxis -post op. (Soon as patient is mobile-we recommend that Xarelto be discontinued as patient is high risk for bleed)   Active Problems: Acute blood loss anemia -Secondary to above-mentioned fracture -No active sites of bleeding -2/9> Hemoglobin dropped from 9.8-6.6 -1 unit PRBC transfusion ordered-  Hemoglobin 7.9, 7.4, >>7.1 >>> 9.8 today -Status post 2 unit PRBC transfusion on 2/12/ 2022   Dementia with acute delirium -Suspected to have delirium secondary to urinary tract infection -According to the son the patient does have dementia at baseline -Continue Depakote and Aricept -Possible exacerbated by sundowning, small dose of Seroquel to be continued as needed  Pain control - of right hip after fracture repair - d/c Hydrocodone as the son is concerned it increased her confusion - add APAP 1 gm TID and Ibuprofen 200 mg QID and try to avoid Morphine if possible   Poor oral intake - in setting of confusion and then sedation- follow oral intake- not  stable to leave the hospital until we see her eating and drinking   CKD 3a -Cr rose from 1.02 to 1.37 yesterday (possibly due to acute blood loss) -Recommending oral hydration, monitoring closely  Dysphagia-   Hiatal hernia -Speech was consulted, status post evaluation -Speech recommendation: Diet recommendations: Dysphagia 3 (mechanical soft);Nectar-thick liquid Liquids provided via: Cup;No straw Medication Administration: Crushed with puree Supervision: Staff to assist with self feeding;Full supervision/cueing for compensatory strategies Compensations: Slow rate;Small sips/bites;Follow solids with  liquid Postural Changes and/or Swallow Maneuvers: Seated upright 90 degrees  UTI - has been given ceftriaxone x 3 days - cultures showing multiple morphotypes  Hypotension with a history of HTN (hypertension) -BP dropped as low as 88/36 this morning-likely secondary to acute blood loss -Discontinued hydrochlorothiazide and  Avapro -Blood pressure stabilized 129/51 this morning    Closed fracture of left humerus -Chronic -Patient son would like to speak to her primary orthopedic physician -Recommended close follow-up    DVT prophylaxis:  Continue SCDS, compression stockings Code Status: Full code Family Communication: Son at the bedside-plan of care was discussed and agreed to blood transfusion 2 units today  Disposition Plan:  Status is: Inpatient     Dispo: The patient is from: Home  Anticipated d/c is to: SNF--in the morning 12/02/2021     Nutritional status:  Nutrition Problem: Increased nutrient needs Etiology: post-op healing Signs/Symptoms: estimated needs Interventions: Ensure Enlive (each supplement provides 350kcal and 20 grams of protein),MVI  The patient's BMI is: Body mass index is 31.74 kg/m. I agree with the assessment and plan as outlined below:    Discharge Instructions:   Discharge Instructions    Activity as tolerated - No restrictions   Complete by: As directed    Activity as tolerated - No restrictions   Complete by: As directed    Call MD for:  difficulty breathing, headache or visual disturbances   Complete by: As directed    Call MD for:  persistant dizziness or light-headedness   Complete by: As directed    Call MD for:  redness, tenderness, or signs of infection (pain, swelling, redness, odor or green/yellow discharge around incision site)   Complete by: As directed    Call MD for:  temperature >100.4   Complete by: As directed    Diet - low sodium heart healthy   Complete by: As directed    Discharge  instructions   Complete by: As directed    Please follow-up with the orthopedic team... Regarding further evaluation hip wound and shoulder fracture   Discharge instructions   Complete by: As directed    F/up with Sophronia Simas.   Discharge wound care:   Complete by: As directed    Per recommendation from orthopedic surgical team   Discharge wound care:   Complete by: As directed    Per Ortho   Increase activity slowly   Complete by: As directed    Increase activity slowly   Complete by: As directed       Contact information for after-discharge care    Destination    [facility] .   Service: Skilled Nursing Contact information: [address] [place] [phone]                 No Known Allergies   Procedures /Studies:   CT Head Wo Contrast  Result Date: 01/27/2021 CLINICAL DATA:  Head trauma, minor. Additional history provided: Fall, neck pain. EXAM: CT  HEAD WITHOUT CONTRAST TECHNIQUE: Contiguous axial images were obtained from the base of the skull through the vertex without intravenous contrast. COMPARISON:  No pertinent prior exams available for comparison. FINDINGS: Brain: Mild cerebral atrophy. There is no acute intracranial hemorrhage. No demarcated cortical infarct. No extra-axial fluid collection. No evidence of intracranial mass. No midline shift. Vascular: No hyperdense vessel.  Atherosclerotic calcifications. Skull: Normal. Negative for fracture or focal lesion. Sinuses/Orbits: Visualized orbits show no acute finding. No significant paranasal sinus disease. IMPRESSION: No evidence of acute intracranial abnormality. Mild cerebral atrophy. Electronically Signed   By: Kellie Simmering DO   On: 01/27/2021 17:40   CT Cervical Spine Wo Contrast  Result Date: 01/27/2021 CLINICAL DATA:  Neck trauma.  Fall/neck pain. EXAM: CT CERVICAL SPINE WITHOUT CONTRAST TECHNIQUE: Multidetector CT imaging of the cervical  spine was performed without intravenous contrast. Multiplanar CT image reconstructions were also generated. COMPARISON:  Chest radiographs 01/07/2010. FINDINGS: Alignment: Trace C4-C5 grade 1 retrolisthesis. 2 mm C7-T1 grade 1 anterolisthesis. Trace T2-T3 grade 1 anterolisthesis. Skull base and vertebrae: The basion-dental and atlanto-dental intervals are maintained.No evidence of acute fracture to the cervical spine. Mild age-indeterminate T2 superior endplate compression deformity with superimposed Schmorl node. Age-indeterminate T3 vertebral compression deformity (30-40% height loss). Soft tissues and spinal canal: No prevertebral fluid or swelling. No visible canal hematoma. Disc levels: Cervical spondylosis with multilevel disc space narrowing, disc bulges, posterior disc osteophytes, uncovertebral hypertrophy and facet arthrosis. Disc space narrowing is moderate/advanced at C4-C5 and C6-C7. Additionally, small central disc protrusions are present at C2-C3 and C3-C4. Facet joint ankylosis on the left at C5-C6. Facet joint ankylosis is also suspected bilaterally at T3-T4. Upper chest: No consolidation within the imaged lung apices. No visible pneumothorax. IMPRESSION: 1. No evidence of acute fracture to the cervical spine. 2. Age-indeterminate T2 and T3 vertebral compression deformities, as described. 3. Mild C4-C5 grade 1 retrolisthesis. 4. Mild C7-T1 and T2-T3 grade 1 anterolisthesis. 5. Cervical spondylosis, as described. 6. Levels of facet joint ankylosis within the cervical and visualized upper thoracic spine. Electronically Signed   By: Kellie Simmering DO   On: 01/27/2021 17:54   CT Abdomen Pelvis W Contrast  Result Date: 01/27/2021 CLINICAL DATA:  [place] from standing EXAM: CT ABDOMEN AND PELVIS WITH CONTRAST TECHNIQUE: Multidetector CT imaging of the abdomen and pelvis was performed using the standard protocol following bolus administration of intravenous contrast. CONTRAST:  120mL OMNIPAQUE IOHEXOL 300  MG/ML  SOLN COMPARISON:  None. FINDINGS: Lower chest: No acute pleural or parenchymal lung disease. Hepatobiliary: No focal liver abnormality is seen. Status post cholecystectomy. No biliary dilatation. Pancreas: Unremarkable. No pancreatic ductal dilatation or surrounding inflammatory changes. Spleen: Normal in size without focal abnormality. Adrenals/Urinary Tract: Adrenal glands are unremarkable. Kidneys are normal, without renal calculi, focal lesion, or hydronephrosis. Bladder is unremarkable. Stomach/Bowel: No bowel obstruction or ileus. No bowel wall thickening or inflammatory change. Vascular/Lymphatic: There is ectasia of the thoracoabdominal aorta without aneurysm. Diffuse atherosclerosis. No pathologic adenopathy. Surgical clips along the left pelvic sidewall and retroperitoneum consistent with lymphadenectomy. Reproductive: Status post hysterectomy. No adnexal masses. Other: No free fluid or free gas. Midline supraumbilical ventral hernia contains a portion of the transverse colon, without bowel obstruction or strangulation. There is a small fat containing umbilical hernia, as well as bilateral fat containing inguinal hernias. Musculoskeletal: There is a chronic L1 compression deformity with vertebra plana and evidence of prior vertebral augmentation. Chronic T11 compression deformity also noted, approximately 50% loss of height. No acute displaced fractures.  Reconstructed images demonstrate no additional findings. IMPRESSION: 1. Midline supraumbilical ventral hernia contains a portion of the transverse colon, without bowel obstruction or strangulation. 2. Small fat containing umbilical and bilateral inguinal hernias. 3. Chronic T11 and L1 compression fractures as above. Electronically Signed   By: Randa Ngo M.D.   On: 01/27/2021 20:37   DG Pelvis Portable  Result Date: 01/27/2021 CLINICAL DATA:  [place] EXAM: PORTABLE PELVIS 1-2 VIEWS COMPARISON:  None. FINDINGS: Supine frontal view of the pelvis  was obtained, limited by positioning and collimation. The left hemipelvis and left hip are excluded by collimation. I do not see any acute fracture within the visualized portions of the pelvis or right hip. Soft tissues are unremarkable. IMPRESSION: 1. No acute pelvic fracture on this limited evaluation. Electronically Signed   By: Randa Ngo M.D.   On: 01/27/2021 16:22   DG Chest Port 1 View  Result Date: 01/27/2021 CLINICAL DATA:  87 year old female with fall. EXAM: PORTABLE CHEST 1 VIEW COMPARISON:  Chest radiograph dated 06/07/2010. FINDINGS: No focal consolidation, pleural effusion or pneumothorax. Stable cardiac silhouette. Atherosclerotic calcification of the aorta. Osteopenia with degenerative changes of the spine. Old right posterior rib fractures. Left humeral neck fracture better seen on the upper extremity radiograph. Partially visualized left upper extremity hardware. IMPRESSION: 1. No active cardiopulmonary disease. 2. Fracture of the left humeral neck. Electronically Signed   By: Anner Crete M.D.   On: 01/27/2021 16:16   DG Knee Right Port  Result Date: 01/27/2021 CLINICAL DATA:  [place] EXAM: PORTABLE RIGHT KNEE - 1-2 VIEW COMPARISON:  None. FINDINGS: Frontal and lateral views of the right knee are obtained. There is a comminuted oblique fracture of the distal right femur, with approximately 15 mm of ventral displacement of the distal fracture fragment. The fracture line extends to the trochlear groove of the patellofemoral articulation, consistent with intra-articular fracture. There is a large joint effusion. Diffuse soft tissue edema. IMPRESSION: 1. Intra-articular comminuted fracture of the distal right femur, with 15 mm of ventral displacement of the distal fracture fragment. 2. Large joint effusion. Electronically Signed   By: Randa Ngo M.D.   On: 01/27/2021 16:20   DG Humerus Left  Result Date: 01/27/2021 CLINICAL DATA:  87 year old female with fall. EXAM: LEFT HUMERUS - 2+  VIEW COMPARISON:  Chest radiograph dated 01/27/2021. FINDINGS: There is a mildly displaced fracture humeral neck of the left with approximately 4 mm medial displacement of the distal fracture fragment. There is fracture of the distal third of the left humeral diaphysis with displacement of the distal fracture fragment and internal fixation hardware. There is advanced osteopenia. The soft tissues are grossly unremarkable. IMPRESSION: 1. Mildly displaced fracture of the left humeral neck. 2. Displaced fracture of the distal humeral diaphysis with displacement of the internal fixation hardware. Electronically Signed   By: Anner Crete M.D.   On: 01/27/2021 16:20   DG Swallowing Func-Speech Pathology  Result Date: 01/31/2021 Objective Swallowing Evaluation: Type of Study: MBS-Modified Barium Swallow Study  Patient Details Name: XARIAH SILVERNAIL MRN: [id number] Date of Birth: [birth year] Today's Date: 01/31/2021 Time: SLP Start Time (ACUTE ONLY): 1330 -SLP Stop Time (ACUTE ONLY): 1345 SLP Time Calculation (min) (ACUTE ONLY): 15 min Past Medical History: Past Medical History: Diagnosis Date . Anemia  . Dementia ([place])  . GERD (gastroesophageal reflux disease)  . Hiatal hernia  . Hyperlipidemia  . Hypertension  . Hypothyroidism  . Macular degeneration  . Obesity  . Osteoporosis  . Ovarian cancer ([place])  Stage IA grade 2 ovarian cancer . Seasonal allergies  Past Surgical History: Past Surgical History: Procedure Laterality Date . CHOLECYSTECTOMY, LAPAROSCOPIC   . colectomy  2005   Rectosigmoid colectomy with reanastomosis . ELBOW ARTHROPLASTY    replacing the humeral stem . KYPHOPLASTY N/A 03/02/2013  Procedure: KYPHOPLASTY;  Surgeon: Erline Levine, MD;  Location: [facility];  Service: Neurosurgery;  Laterality: N/A;  Lumbar One Kyphoplasty . ORIF FEMUR FRACTURE Right 01/28/2021  Procedure: OPEN REDUCTION INTERNAL FIXATION (ORIF) DISTAL FEMUR FRACTURE;  Surgeon: Altamese Brinnon, MD;  Location: [facility];  Service: Orthopedics;   Laterality: Right; . OTHER SURGICAL HISTORY   . OTHER SURGICAL HISTORY    Ovarian cancer resection and staging HPI: Pt is an 87 y.o. female with PMH includes HTN, prior L elbow replacement, obesity, osteoporosis, dementia. Pt was admitted 01/27/21 with AMS after sustaining  a fall. Imaging revealed R distal femur fx, L humeral head and midshift fx, age indeterminate T2-3 compression deformities. S/p R femur ORIF 2/8. SLP consulted due to reports from patient's son, that the "patient sometimes coughs when drinking thin liquids and coughs in her sleep especially when she is laying flat in the bed. Son believes this is secondary to hiatal hernia-he does state that the coughing is worse when she uses a straw to drink liquids" Aspiration is therefore suspected by referring MD.  No data recorded Assessment / Plan / Recommendation CHL IP CLINICAL IMPRESSIONS 01/31/2021 Clinical Impression Pt presents with oropharynegal dysphagia characterized by impaired bolus cohesion, reduced lingual retraction, and reduced anterior laryngeal movement. Pt demonstrated premature spillage to the pyriform sinuses and incomplete epiglottic inversion which resulted in penetration (PAS 5) and eventual aspiration (PAS 7) of thin liquids. Aspiration consistently resulted in throat clearing which was ineffective. Prompted coughing was inconsistently effective in expelling the aspirate. Some airway protection was achieved by approximation of the arytenoids to the laryungeal surface of the epigllotis with thin liquids and epiglottic inversion was improved with thicker consistencies and with solids. Mild to moderate vallecular and pyriform sinus residue was noted accross consistencies, but the amount of residue increased with bolus size and with advancement of solids. Postural modifications were attempted to reduce aspiration risk, but pt was unable to demonstrate/maintain these positions despite cues. A dysphagia 3 diet with nectar thick liquids is  recommended at this time. SLP will continue to follow pt and continued SLP services are recommended following discharge to target dysphagia. SLP Visit Diagnosis Dysphagia, oropharyngeal phase (R13.12) Attention and concentration deficit following -- Frontal lobe and executive function deficit following -- Impact on safety and function Mild aspiration risk;Moderate aspiration risk   CH[facility] IP TREATMENT RECOMMENDATION 01/31/2021 Treatment Recommendations Therapy as outlined in treatment plan below   Prognosis 01/31/2021 Prognosis for Safe Diet Advancement Good Barriers to Reach Goals Cognitive deficits Barriers/Prognosis Comment -- [facility] IP DIET RECOMMENDATION 01/31/2021 SLP Diet Recommendations Dysphagia 3 (Mech soft) solids;Nectar thick liquid Liquid Administration via Cup;No straw Medication Administration Whole meds with puree Compensations Slow rate;Small sips/bites;Follow solids with liquid Postural Changes Remain semi-upright after after feeds/meals (Comment);Seated upright at 90 degrees   CHL IP OTHER RECOMMENDATIONS 01/31/2021 Recommended Consults -- Oral Care Recommendations Oral care BID Other Recommendations Order thickener from pharmacy   CH[facility] IP FOLLOW UP RECOMMENDATIONS 01/31/2021 Follow up Recommendations Skilled Nursing facility   [facility] IP FREQUENCY AND DURATION 01/31/2021 Speech Therapy Frequency (ACUTE ONLY) min 2x/week Treatment Duration 2 weeks      CHL IP ORAL PHASE 01/31/2021 Oral Phase Impaired Oral - Pudding Teaspoon --  Oral - Pudding Cup -- Oral - Honey Teaspoon -- Oral - Honey Cup -- Oral - Nectar Teaspoon -- Oral - Nectar Cup Decreased bolus cohesion;Premature spillage Oral - Nectar Straw Decreased bolus cohesion;Premature spillage Oral - Thin Teaspoon -- Oral - Thin Cup Decreased bolus cohesion;Premature spillage Oral - Thin Straw Decreased bolus cohesion;Premature spillage Oral - Puree Decreased bolus cohesion;Premature spillage Oral - Mech Soft -- Oral - Regular WFL Oral - Multi-Consistency -- Oral -  Pill -- Oral Phase - Comment --  CHL IP PHARYNGEAL PHASE 01/31/2021 Pharyngeal Phase Impaired Pharyngeal- Pudding Teaspoon -- Pharyngeal -- Pharyngeal- Pudding Cup -- Pharyngeal -- Pharyngeal- Honey Teaspoon -- Pharyngeal -- Pharyngeal- Honey Cup -- Pharyngeal -- Pharyngeal- Nectar Teaspoon -- Pharyngeal -- Pharyngeal- Nectar Cup Pharyngeal residue - valleculae;Pharyngeal residue - pyriform;Reduced anterior laryngeal mobility;Reduced epiglottic inversion;Reduced tongue base retraction Pharyngeal -- Pharyngeal- Nectar Straw Pharyngeal residue - valleculae;Pharyngeal residue - pyriform;Reduced anterior laryngeal mobility;Reduced epiglottic inversion;Reduced tongue base retraction Pharyngeal -- Pharyngeal- Thin Teaspoon -- Pharyngeal -- Pharyngeal- Thin Cup Pharyngeal residue - valleculae;Pharyngeal residue - pyriform;Reduced anterior laryngeal mobility;Reduced epiglottic inversion;Reduced tongue base retraction;Penetration/Aspiration during swallow;Penetration/Apiration after swallow Pharyngeal Material enters airway, CONTACTS cords and then ejected out;Material enters airway, passes BELOW cords and not ejected out despite cough attempt by patient Pharyngeal- Thin Straw Pharyngeal residue - valleculae;Pharyngeal residue - pyriform;Reduced anterior laryngeal mobility;Reduced epiglottic inversion;Reduced tongue base retraction;Penetration/Aspiration during swallow;Penetration/Apiration after swallow Pharyngeal Material enters airway, CONTACTS cords and then ejected out;Material enters airway, passes BELOW cords and not ejected out despite cough attempt by patient Pharyngeal- Puree Pharyngeal residue - valleculae;Pharyngeal residue - pyriform;Reduced anterior laryngeal mobility;Reduced epiglottic inversion;Reduced tongue base retraction Pharyngeal -- Pharyngeal- Mechanical Soft -- Pharyngeal -- Pharyngeal- Regular Pharyngeal residue - valleculae;Pharyngeal residue - pyriform;Reduced anterior laryngeal mobility;Reduced  epiglottic inversion;Reduced tongue base retraction Pharyngeal -- Pharyngeal- Multi-consistency -- Pharyngeal -- Pharyngeal- Pill Pharyngeal residue - valleculae;Pharyngeal residue - pyriform;Reduced anterior laryngeal mobility;Reduced epiglottic inversion;Reduced tongue base retraction Pharyngeal -- Pharyngeal Comment --  CHL IP CERVICAL ESOPHAGEAL PHASE 01/31/2021 Cervical Esophageal Phase WFL Pudding Teaspoon -- Pudding Cup -- Honey Teaspoon -- Honey Cup -- Nectar Teaspoon -- Nectar Cup -- Nectar Straw -- Thin Teaspoon -- Thin Cup -- Thin Straw -- Puree -- Mechanical Soft -- Regular -- Multi-consistency -- Pill -- Cervical Esophageal Comment -- Shanika I. Hardin Negus, [place], [place] Office number [phone] Pager [phone] Horton Marshall 01/31/2021, 3:14 PM              DG C-Arm 1-60 Min  Result Date: 01/28/2021 CLINICAL DATA:  Trauma.  ORIF of right distal femoral fracture. EXAM: RIGHT FEMUR 2 VIEWS; DG C-ARM 1-60 MIN COMPARISON:  January 27, 2021. FINDINGS: Fluoro time: 1 minutes 27 seconds. Reported radiation: 10.68 mGy. Nine C-arm fluoroscopic images were obtained intraoperatively and submitted for post operative interpretation. These images demonstrate surgical changes associated with intramedullary rod and screw fixation of a distal femoral metadiaphyseal fracture. Final images demonstrate improved, near anatomic alignment. Please see the performing provider's procedural report for further detail. IMPRESSION: Intraoperative fluoroscopic imaging, as detailed above. Electronically Signed   By: Margaretha Sheffield MD   On: 01/28/2021 13:57   DG FEMUR, MIN 2 VIEWS RIGHT  Result Date: 01/28/2021 CLINICAL DATA:  Trauma.  ORIF of right distal femoral fracture. EXAM: RIGHT FEMUR 2 VIEWS; DG C-ARM 1-60 MIN COMPARISON:  January 27, 2021. FINDINGS: Fluoro time: 1 minutes 27 seconds. Reported radiation: 10.68 mGy. Nine C-arm fluoroscopic images were obtained intraoperatively and  submitted for post operative interpretation. These  images demonstrate surgical changes associated with intramedullary rod and screw fixation of a distal femoral metadiaphyseal fracture. Final images demonstrate improved, near anatomic alignment. Please see the performing provider's procedural report for further detail. IMPRESSION: Intraoperative fluoroscopic imaging, as detailed above. Electronically Signed   By: Margaretha Sheffield MD   On: 01/28/2021 13:57   DG Femur Min 2 Views Right  Result Date: 01/27/2021 CLINICAL DATA:  Distal right femur pain and deformity post fall EXAM: RIGHT FEMUR 2 VIEWS COMPARISON:  None. FINDINGS: There is a comminuted oblique fracture of the distal femoral metadiaphysis with approximately 1 shaft with of medial displacement and impaction of the distal fracture fragment. IMPRESSION: Comminuted displaced oblique fracture of the distal femoral metadiaphysis. Electronically Signed   By: Dahlia Bailiff MD   On: 01/27/2021 18:09   DG FEMUR PORT, MIN 2 VIEWS RIGHT  Result Date: 01/28/2021 CLINICAL DATA:  Status post ORIF of distal right femoral fracture EXAM: RIGHT FEMUR PORTABLE 2 VIEW COMPARISON:  Intraoperative films from earlier in the same day. FINDINGS: Medullary rod is noted with proximal and distal fixation screws. Fracture fragments are in near anatomic alignment. No soft tissue abnormality is seen. IMPRESSION: Status post ORIF of mid to distal right femoral fracture. Electronically Signed   By: Inez Catalina M.D.   On: 01/28/2021 16:45    Subjective:   Patient was seen and examined 02/02/2021, 7:34 AM Patient stable today. No acute distress.  No issues overnight Stable for discharge.  Discharge Exam:    Vitals:   02/01/21 1613 02/01/21 1827 02/01/21 1936 02/02/21 0300  BP: (!) 120/54 139/67 (!) 153/48 (!) 148/62  Pulse: 77 67 (!) 57 69  Resp: 17 18 16 17   Temp: 97.8 F (36.6 C) 97.7 F (36.5 C) 98 F (36.7 C) 98 F (36.7 C)  TempSrc: Oral Oral  Oral  SpO2:  99% 98% 95% 99%  Weight:      Height:            Physical Exam:   General:  Alert, oriented, cooperative, no distress;   HEENT:  Normocephalic, PERRL, otherwise with in Normal limits   Neuro:  CNII-XII intact. , normal motor and sensation, reflexes intact   Lungs:   Clear to auscultation BL, Respirations unlabored, no wheezes / crackles  Cardio:    S1/S2, RRR, No murmure, No Rubs or Gallops   Abdomen:   Soft, non-tender, bowel sounds active all four quadrants,  no guarding or peritoneal signs.  Muscular skeletal:   Right hip discomfort, postop wound, limited range of motion due to mild pain  Right shoulder fracture-none range of motion Limited exam - in bed, able to move all 4 extremities, ,  2+ pulses,  symmetric, No pitting edema  Skin:  Dry, warm to touch, negative for any Rashes,  Wounds: Please see nursing documentation             The results of significant diagnostics from this hospitalization (including imaging, microbiology, ancillary and laboratory) are listed below for reference.      Microbiology:   Recent Results (from the past 240 hour(s))  SARS CORONAVIRUS 2 (TAT 6-24 HRS) Nasopharyngeal Nasopharyngeal Swab     Status: None   Collection Time: 01/27/21  7:47 PM   Specimen: Nasopharyngeal Swab  Result Value Ref Range Status   SARS Coronavirus 2 NEGATIVE NEGATIVE Final    Comment: (NOTE) SARS-CoV-2 target nucleic acids are NOT DETECTED.  The SARS-CoV-2 RNA is generally detectable in upper and lower respiratory specimens  during the acute phase of infection. Negative results do not preclude SARS-CoV-2 infection, do not rule out co-infections with other pathogens, and should not be used as the sole basis for treatment or other patient management decisions. Negative results must be combined with clinical observations, patient history, and epidemiological information. The expected result is Negative.  Fact Sheet for  Patients: SugarRoll.be  Fact Sheet for Healthcare Providers: https://www.woods-mathews.com/  This test is not yet approved or cleared by the [place] FDA and  has been authorized for detection and/or diagnosis of SARS-CoV-2 by FDA under an Emergency Use Authorization (EUA). This EUA will remain  in effect (meaning this test can be used) for the duration of the COVID-19 declaration under Se ction 564(b)(1) of the Act, 21 U.S.C. section 360bbb-3(b)(1), unless the authorization is terminated or revoked sooner.  Performed at [facility], [place] [address], [place], [place] [address]   Urine culture     Status: Abnormal   Collection Time: 01/27/21  7:54 PM   Specimen: Urine, Random  Result Value Ref Range Status   Specimen Description URINE, RANDOM  Final   Special Requests   Final    NONE Performed at [facility], [place] [address], [place], [place] [address]    Culture (A)  Final    >=100,000 COLONIES/mL MULTIPLE SPECIES PRESENT, SUGGEST RECOLLECTION   Report Status 01/29/2021 FINAL  Final  SARS CORONAVIRUS 2 (TAT 6-24 HRS) Nasopharyngeal Nasopharyngeal Swab     Status: None   Collection Time: 01/31/21 10:19 AM   Specimen: Nasopharyngeal Swab  Result Value Ref Range Status   SARS Coronavirus 2 NEGATIVE NEGATIVE Final    Comment: (NOTE) SARS-CoV-2 target nucleic acids are NOT DETECTED.  The SARS-CoV-2 RNA is generally detectable in upper and lower respiratory specimens during the acute phase of infection. Negative results do not preclude SARS-CoV-2 infection, do not rule out co-infections with other pathogens, and should not be used as the sole basis for treatment or other patient management decisions. Negative results must be combined with clinical observations, patient history, and epidemiological information. The expected result is Negative.  Fact Sheet for Patients: SugarRoll.be  Fact  Sheet for Healthcare Providers: https://www.woods-mathews.com/  This test is not yet approved or cleared by the [place] FDA and  has been authorized for detection and/or diagnosis of SARS-CoV-2 by FDA under an Emergency Use Authorization (EUA). This EUA will remain  in effect (meaning this test can be used) for the duration of the COVID-19 declaration under Se ction 564(b)(1) of the Act, 21 U.S.C. section 360bbb-3(b)(1), unless the authorization is terminated or revoked sooner.  Performed at [facility], [place] [address] [place], [place] [address]      Labs:   CBC: Recent Labs  Lab 01/27/21 1710 01/27/21 2209 01/29/21 0241 01/29/21 1355 01/30/21 0125 01/31/21 0109 02/01/21 0140 02/02/21 0556  WBC 16.9*   < > 6.9  --  7.7 7.1 6.2 5.6  NEUTROABS 14.1*  --   --   --   --   --   --   --   HGB 11.4*   < > 6.6* 8.2* 7.9* 7.4* 7.1* 9.8*  HCT 37.3   < > 21.3* 25.6* 22.9* 23.9* 22.5* 30.0*  MCV 97.1   < > 96.8  --  93.1 98.0 97.4 91.5  PLT 208   < > 114*  --  120* 128* 177 205   < > = values in this interval not displayed.  Basic Metabolic Panel: Recent Labs  Lab 01/28/21 0752 01/29/21 0241 01/30/21 1220 02/01/21 0140 02/02/21 0556  NA 138 138 140 140 142  K 4.0 4.1 4.2 4.0 4.0  CL 103 105 107 107 106  CO2 25 24 23 23 27   GLUCOSE 112* 149* 130* 101* 102*  BUN 20 24* 33* 43* 35*  CREATININE 1.02* 1.37* 1.53* 1.43* 1.04*  CALCIUM 8.3* 8.0* 7.9* 8.1* 8.3*  MG  --  1.8  --   --   --    Liver Function Tests: Recent Labs  Lab 01/27/21 1710  AST 23  ALT 21  ALKPHOS 50  BILITOT 0.7  PROT 6.1*  ALBUMIN 3.3*  Urinalysis    Component Value Date/Time   COLORURINE AMBER (A) 01/27/2021 1954   APPEARANCEUR HAZY (A) 01/27/2021 1954   LABSPEC 1.026 01/27/2021 1954   PHURINE 5.0 01/27/2021 1954   GLUCOSEU NEGATIVE 01/27/2021 1954   HGBUR MODERATE (A) 01/27/2021 1954   BILIRUBINUR NEGATIVE 01/27/2021 1954   KETONESUR 5 (A) 01/27/2021 1954    PROTEINUR 30 (A) 01/27/2021 1954   UROBILINOGEN 0.2 09/18/2015 1737   NITRITE NEGATIVE 01/27/2021 1954   LEUKOCYTESUR MODERATE (A) 01/27/2021 1954         Time coordinating discharge: Over 55 minutes  SIGNED: Deatra James, MD, FACP, FHM. [facility],  Please use [email or web address] to Page If 7PM-7AM, please contact night-coverage [email or web address] [email or web address] [facility] 02/02/2021, 7:34 AM

## 2021-02-02 NOTE — Plan of Care (Signed)

## 2021-02-02 NOTE — Progress Notes (Signed)
[facility] facility and gave report to [place]. AVS was given to patient and Son and went over with her. IV was removed with catheter intact. Pt or son had no further questions. [facility] will be transporting pt.

## 2021-02-02 NOTE — TOC Transition Note (Signed)
Transition of Care [facility]) - CM/SW Discharge Note   Patient Details  Name: [name] MRN: [id number] Date of Birth: 1931-01-21  Transition of Care [facility] CM/SW Contact:  Gabrielle Dare Phone Number: 02/02/2021, 11:39 AM   Clinical Narrative:    Patient will Discharge To: [facility] Date:02/02/21 Family Notified:yes, son Sebrena Engh, [place] Transport KF:[id number]   Per MD patient ready for DC to [facility] . RN, patient, patient's family, and facility notified of DC. Assessment, Fl2/Pasrr, and Discharge Summary sent to facility. RN given number for report [phone]). DC packet on chart. Ambulance transport requested for patient for 1:00pm   CSW signing off.  Reed Breech LCSWA [phone]     Final next level of care: Skilled Nursing Facility Barriers to Discharge: No Barriers Identified   Patient Goals and CMS Choice        Discharge Placement              Patient chooses bed at: [facility] Patient to be transferred to facility by: [place] Name of family member notified: Wylie Hail, son Patient and family notified of of transfer: 02/02/21  Discharge Plan and Services                                     Social Determinants of Health (SDOH) Interventions     Readmission Risk Interventions No flowsheet data found.

## 2021-02-03 DIAGNOSIS — K5900 Constipation, unspecified: Secondary | ICD-10-CM | POA: Diagnosis not present

## 2021-02-03 DIAGNOSIS — R1310 Dysphagia, unspecified: Secondary | ICD-10-CM | POA: Diagnosis not present

## 2021-02-03 DIAGNOSIS — D500 Iron deficiency anemia secondary to blood loss (chronic): Secondary | ICD-10-CM | POA: Diagnosis not present

## 2021-02-03 DIAGNOSIS — K219 Gastro-esophageal reflux disease without esophagitis: Secondary | ICD-10-CM | POA: Diagnosis not present

## 2021-02-03 DIAGNOSIS — E785 Hyperlipidemia, unspecified: Secondary | ICD-10-CM | POA: Diagnosis not present

## 2021-02-03 DIAGNOSIS — F0390 Unspecified dementia without behavioral disturbance: Secondary | ICD-10-CM | POA: Diagnosis not present

## 2021-02-03 DIAGNOSIS — R410 Disorientation, unspecified: Secondary | ICD-10-CM | POA: Diagnosis not present

## 2021-02-03 DIAGNOSIS — K449 Diaphragmatic hernia without obstruction or gangrene: Secondary | ICD-10-CM | POA: Diagnosis not present

## 2021-02-06 DIAGNOSIS — K219 Gastro-esophageal reflux disease without esophagitis: Secondary | ICD-10-CM | POA: Diagnosis not present

## 2021-02-06 DIAGNOSIS — I10 Essential (primary) hypertension: Secondary | ICD-10-CM | POA: Diagnosis not present

## 2021-02-06 DIAGNOSIS — E559 Vitamin D deficiency, unspecified: Secondary | ICD-10-CM | POA: Diagnosis not present

## 2021-02-06 DIAGNOSIS — D500 Iron deficiency anemia secondary to blood loss (chronic): Secondary | ICD-10-CM | POA: Diagnosis not present

## 2021-02-06 DIAGNOSIS — R410 Disorientation, unspecified: Secondary | ICD-10-CM | POA: Diagnosis not present

## 2021-02-06 DIAGNOSIS — R1310 Dysphagia, unspecified: Secondary | ICD-10-CM | POA: Diagnosis not present

## 2021-02-06 DIAGNOSIS — E039 Hypothyroidism, unspecified: Secondary | ICD-10-CM | POA: Diagnosis not present

## 2021-02-06 DIAGNOSIS — F0390 Unspecified dementia without behavioral disturbance: Secondary | ICD-10-CM | POA: Diagnosis not present

## 2021-02-06 DIAGNOSIS — N390 Urinary tract infection, site not specified: Secondary | ICD-10-CM | POA: Diagnosis not present

## 2021-02-06 DIAGNOSIS — K449 Diaphragmatic hernia without obstruction or gangrene: Secondary | ICD-10-CM | POA: Diagnosis not present

## 2021-02-06 DIAGNOSIS — E785 Hyperlipidemia, unspecified: Secondary | ICD-10-CM | POA: Diagnosis not present

## 2021-02-06 DIAGNOSIS — N189 Chronic kidney disease, unspecified: Secondary | ICD-10-CM | POA: Diagnosis not present

## 2021-02-09 ENCOUNTER — Other Ambulatory Visit: Payer: Self-pay | Admitting: Cardiology

## 2021-02-10 DIAGNOSIS — S72401D Unspecified fracture of lower end of right femur, subsequent encounter for closed fracture with routine healing: Secondary | ICD-10-CM | POA: Diagnosis not present

## 2021-02-10 DIAGNOSIS — F0390 Unspecified dementia without behavioral disturbance: Secondary | ICD-10-CM | POA: Diagnosis not present

## 2021-02-10 DIAGNOSIS — D500 Iron deficiency anemia secondary to blood loss (chronic): Secondary | ICD-10-CM | POA: Diagnosis not present

## 2021-02-10 DIAGNOSIS — R443 Hallucinations, unspecified: Secondary | ICD-10-CM | POA: Diagnosis not present

## 2021-02-10 DIAGNOSIS — R410 Disorientation, unspecified: Secondary | ICD-10-CM | POA: Diagnosis not present

## 2021-02-10 DIAGNOSIS — N390 Urinary tract infection, site not specified: Secondary | ICD-10-CM | POA: Diagnosis not present

## 2021-02-10 DIAGNOSIS — R4182 Altered mental status, unspecified: Secondary | ICD-10-CM | POA: Diagnosis not present

## 2021-02-17 DIAGNOSIS — N390 Urinary tract infection, site not specified: Secondary | ICD-10-CM | POA: Diagnosis not present

## 2021-02-17 DIAGNOSIS — K219 Gastro-esophageal reflux disease without esophagitis: Secondary | ICD-10-CM | POA: Diagnosis not present

## 2021-02-17 DIAGNOSIS — R1310 Dysphagia, unspecified: Secondary | ICD-10-CM | POA: Diagnosis not present

## 2021-02-17 DIAGNOSIS — R443 Hallucinations, unspecified: Secondary | ICD-10-CM | POA: Diagnosis not present

## 2021-02-17 DIAGNOSIS — R5381 Other malaise: Secondary | ICD-10-CM | POA: Diagnosis not present

## 2021-02-17 DIAGNOSIS — E785 Hyperlipidemia, unspecified: Secondary | ICD-10-CM | POA: Diagnosis not present

## 2021-02-17 DIAGNOSIS — K5900 Constipation, unspecified: Secondary | ICD-10-CM | POA: Diagnosis not present

## 2021-02-17 DIAGNOSIS — N189 Chronic kidney disease, unspecified: Secondary | ICD-10-CM | POA: Diagnosis not present

## 2021-02-17 DIAGNOSIS — R4182 Altered mental status, unspecified: Secondary | ICD-10-CM | POA: Diagnosis not present

## 2021-02-19 DIAGNOSIS — S72451D Displaced supracondylar fracture without intracondylar extension of lower end of right femur, subsequent encounter for closed fracture with routine healing: Secondary | ICD-10-CM | POA: Diagnosis not present

## 2021-02-24 DIAGNOSIS — R4182 Altered mental status, unspecified: Secondary | ICD-10-CM | POA: Diagnosis not present

## 2021-02-24 DIAGNOSIS — I10 Essential (primary) hypertension: Secondary | ICD-10-CM | POA: Diagnosis not present

## 2021-02-24 DIAGNOSIS — R1310 Dysphagia, unspecified: Secondary | ICD-10-CM | POA: Diagnosis not present

## 2021-02-24 DIAGNOSIS — R443 Hallucinations, unspecified: Secondary | ICD-10-CM | POA: Diagnosis not present

## 2021-02-24 DIAGNOSIS — K219 Gastro-esophageal reflux disease without esophagitis: Secondary | ICD-10-CM | POA: Diagnosis not present

## 2021-02-24 DIAGNOSIS — E559 Vitamin D deficiency, unspecified: Secondary | ICD-10-CM | POA: Diagnosis not present

## 2021-02-24 DIAGNOSIS — N390 Urinary tract infection, site not specified: Secondary | ICD-10-CM | POA: Diagnosis not present

## 2021-02-24 DIAGNOSIS — N189 Chronic kidney disease, unspecified: Secondary | ICD-10-CM | POA: Diagnosis not present

## 2021-02-24 DIAGNOSIS — S72401D Unspecified fracture of lower end of right femur, subsequent encounter for closed fracture with routine healing: Secondary | ICD-10-CM | POA: Diagnosis not present

## 2021-02-24 DIAGNOSIS — E039 Hypothyroidism, unspecified: Secondary | ICD-10-CM | POA: Diagnosis not present

## 2021-03-05 DIAGNOSIS — S72301D Unspecified fracture of shaft of right femur, subsequent encounter for closed fracture with routine healing: Secondary | ICD-10-CM | POA: Diagnosis not present

## 2021-03-05 DIAGNOSIS — S72451D Displaced supracondylar fracture without intracondylar extension of lower end of right femur, subsequent encounter for closed fracture with routine healing: Secondary | ICD-10-CM | POA: Diagnosis not present

## 2021-03-06 ENCOUNTER — Other Ambulatory Visit: Payer: Self-pay | Admitting: *Deleted

## 2021-03-06 DIAGNOSIS — K219 Gastro-esophageal reflux disease without esophagitis: Secondary | ICD-10-CM | POA: Diagnosis not present

## 2021-03-06 DIAGNOSIS — F0390 Unspecified dementia without behavioral disturbance: Secondary | ICD-10-CM | POA: Diagnosis not present

## 2021-03-06 DIAGNOSIS — E785 Hyperlipidemia, unspecified: Secondary | ICD-10-CM | POA: Diagnosis not present

## 2021-03-06 DIAGNOSIS — R4182 Altered mental status, unspecified: Secondary | ICD-10-CM | POA: Diagnosis not present

## 2021-03-06 DIAGNOSIS — R443 Hallucinations, unspecified: Secondary | ICD-10-CM | POA: Diagnosis not present

## 2021-03-06 NOTE — Patient Outreach (Signed)
Member screened for potential [facility] Care Management needs.  Verified in [place] (Patient [name]) that member resides in [facility].   Communication sent to facility SW to inquire about transition plans and potential [facility] need post SNF.    Marthenia Rolling, MSN, RN,BSN [place] Acute Care Coordinator [phone] [facility]) [phone]  (Toll free office)

## 2021-03-06 NOTE — Patient Outreach (Signed)
[place] Coordinator follow up.  Update received from [facility] indicating family meeting scheduled for next week. Facility is recommending long term care or 24hr care at home.   Writer will follow up with SNF SW at later time regarding transition plans and potential [facility] needs.    Marthenia Rolling, MSN, RN,BSN [place] Acute Care Coordinator [phone] [facility]) [phone]  (Toll free office)

## 2021-03-10 DIAGNOSIS — S42202A Unspecified fracture of upper end of left humerus, initial encounter for closed fracture: Secondary | ICD-10-CM | POA: Diagnosis not present

## 2021-03-12 DIAGNOSIS — R350 Frequency of micturition: Secondary | ICD-10-CM | POA: Diagnosis not present

## 2021-03-13 DIAGNOSIS — J9601 Acute respiratory failure with hypoxia: Secondary | ICD-10-CM | POA: Diagnosis not present

## 2021-03-14 ENCOUNTER — Other Ambulatory Visit: Payer: Self-pay | Admitting: *Deleted

## 2021-03-14 NOTE — Patient Outreach (Signed)
[facility] Post- Acute Care Coordinator follow up. Member screened for potential [facility] Care Management needs.  Mrs. Depasquale resides in [facility]. Communication sent to facility SW to inquire about transition plans.   Will continue to follow for potential [facility] Care Management needs while member resides in SNF.    Marthenia Rolling, MSN, RN,BSN [place] Acute Care Coordinator [phone] [facility]) [phone]  (Toll free office)

## 2021-03-31 ENCOUNTER — Other Ambulatory Visit (HOSPITAL_COMMUNITY): Payer: Self-pay | Admitting: Adult Health

## 2021-03-31 DIAGNOSIS — D500 Iron deficiency anemia secondary to blood loss (chronic): Secondary | ICD-10-CM | POA: Diagnosis not present

## 2021-03-31 DIAGNOSIS — R1310 Dysphagia, unspecified: Secondary | ICD-10-CM

## 2021-03-31 DIAGNOSIS — K449 Diaphragmatic hernia without obstruction or gangrene: Secondary | ICD-10-CM | POA: Diagnosis not present

## 2021-03-31 DIAGNOSIS — I10 Essential (primary) hypertension: Secondary | ICD-10-CM | POA: Diagnosis not present

## 2021-03-31 DIAGNOSIS — F0390 Unspecified dementia without behavioral disturbance: Secondary | ICD-10-CM | POA: Diagnosis not present

## 2021-03-31 DIAGNOSIS — F3112 Bipolar disorder, current episode manic without psychotic features, moderate: Secondary | ICD-10-CM | POA: Diagnosis not present

## 2021-03-31 DIAGNOSIS — E559 Vitamin D deficiency, unspecified: Secondary | ICD-10-CM | POA: Diagnosis not present

## 2021-03-31 DIAGNOSIS — G300 Alzheimer's disease with early onset: Secondary | ICD-10-CM | POA: Diagnosis not present

## 2021-03-31 DIAGNOSIS — N390 Urinary tract infection, site not specified: Secondary | ICD-10-CM | POA: Diagnosis not present

## 2021-03-31 DIAGNOSIS — F29 Unspecified psychosis not due to a substance or known physiological condition: Secondary | ICD-10-CM | POA: Diagnosis not present

## 2021-04-02 DIAGNOSIS — S72451D Displaced supracondylar fracture without intracondylar extension of lower end of right femur, subsequent encounter for closed fracture with routine healing: Secondary | ICD-10-CM | POA: Diagnosis not present

## 2021-04-02 DIAGNOSIS — N189 Chronic kidney disease, unspecified: Secondary | ICD-10-CM | POA: Diagnosis not present

## 2021-04-02 DIAGNOSIS — N390 Urinary tract infection, site not specified: Secondary | ICD-10-CM | POA: Diagnosis not present

## 2021-04-02 DIAGNOSIS — F0390 Unspecified dementia without behavioral disturbance: Secondary | ICD-10-CM | POA: Diagnosis not present

## 2021-04-02 DIAGNOSIS — K219 Gastro-esophageal reflux disease without esophagitis: Secondary | ICD-10-CM | POA: Diagnosis not present

## 2021-04-02 DIAGNOSIS — D500 Iron deficiency anemia secondary to blood loss (chronic): Secondary | ICD-10-CM | POA: Diagnosis not present

## 2021-04-02 DIAGNOSIS — R5381 Other malaise: Secondary | ICD-10-CM | POA: Diagnosis not present

## 2021-04-02 DIAGNOSIS — E785 Hyperlipidemia, unspecified: Secondary | ICD-10-CM | POA: Diagnosis not present

## 2021-04-02 DIAGNOSIS — K5900 Constipation, unspecified: Secondary | ICD-10-CM | POA: Diagnosis not present

## 2021-04-03 ENCOUNTER — Other Ambulatory Visit (HOSPITAL_COMMUNITY): Payer: Self-pay

## 2021-04-03 DIAGNOSIS — R1310 Dysphagia, unspecified: Secondary | ICD-10-CM

## 2021-04-03 DIAGNOSIS — R059 Cough, unspecified: Secondary | ICD-10-CM

## 2021-04-03 DIAGNOSIS — K449 Diaphragmatic hernia without obstruction or gangrene: Secondary | ICD-10-CM | POA: Diagnosis not present

## 2021-04-03 DIAGNOSIS — D500 Iron deficiency anemia secondary to blood loss (chronic): Secondary | ICD-10-CM | POA: Diagnosis not present

## 2021-04-03 DIAGNOSIS — N189 Chronic kidney disease, unspecified: Secondary | ICD-10-CM | POA: Diagnosis not present

## 2021-04-03 DIAGNOSIS — I10 Essential (primary) hypertension: Secondary | ICD-10-CM | POA: Diagnosis not present

## 2021-04-03 DIAGNOSIS — F0390 Unspecified dementia without behavioral disturbance: Secondary | ICD-10-CM | POA: Diagnosis not present

## 2021-04-03 DIAGNOSIS — N390 Urinary tract infection, site not specified: Secondary | ICD-10-CM | POA: Diagnosis not present

## 2021-04-04 ENCOUNTER — Ambulatory Visit (HOSPITAL_COMMUNITY): Payer: Medicare Other

## 2021-04-07 DIAGNOSIS — N390 Urinary tract infection, site not specified: Secondary | ICD-10-CM | POA: Diagnosis not present

## 2021-04-07 DIAGNOSIS — R443 Hallucinations, unspecified: Secondary | ICD-10-CM | POA: Diagnosis not present

## 2021-04-07 DIAGNOSIS — R5381 Other malaise: Secondary | ICD-10-CM | POA: Diagnosis not present

## 2021-04-07 DIAGNOSIS — F32A Depression, unspecified: Secondary | ICD-10-CM | POA: Diagnosis not present

## 2021-04-07 DIAGNOSIS — F419 Anxiety disorder, unspecified: Secondary | ICD-10-CM | POA: Diagnosis not present

## 2021-04-07 DIAGNOSIS — F0390 Unspecified dementia without behavioral disturbance: Secondary | ICD-10-CM | POA: Diagnosis not present

## 2021-04-07 DIAGNOSIS — R58 Hemorrhage, not elsewhere classified: Secondary | ICD-10-CM | POA: Diagnosis not present

## 2021-04-07 DIAGNOSIS — D500 Iron deficiency anemia secondary to blood loss (chronic): Secondary | ICD-10-CM | POA: Diagnosis not present

## 2021-04-07 DIAGNOSIS — K449 Diaphragmatic hernia without obstruction or gangrene: Secondary | ICD-10-CM | POA: Diagnosis not present

## 2021-04-07 DIAGNOSIS — E559 Vitamin D deficiency, unspecified: Secondary | ICD-10-CM | POA: Diagnosis not present

## 2021-04-07 DIAGNOSIS — K5900 Constipation, unspecified: Secondary | ICD-10-CM | POA: Diagnosis not present

## 2021-04-07 DIAGNOSIS — I10 Essential (primary) hypertension: Secondary | ICD-10-CM | POA: Diagnosis not present

## 2021-04-08 ENCOUNTER — Ambulatory Visit (HOSPITAL_COMMUNITY)
Admission: RE | Admit: 2021-04-08 | Discharge: 2021-04-08 | Disposition: A | Discharge status: Home / Self Care | Payer: No Typology Code available for payment source | Source: Ambulatory Visit | Attending: Internal Medicine | Admitting: Internal Medicine

## 2021-04-08 ENCOUNTER — Other Ambulatory Visit: Payer: Self-pay

## 2021-04-08 DIAGNOSIS — R059 Cough, unspecified: Secondary | ICD-10-CM | POA: Insufficient documentation

## 2021-04-08 DIAGNOSIS — R1310 Dysphagia, unspecified: Secondary | ICD-10-CM | POA: Diagnosis not present

## 2021-04-08 NOTE — Progress Notes (Signed)
Modified Barium Swallow Progress Note  Patient Details  Name: [name] MRN: [id number] Date of Birth: [birth year]  Today's Date: 04/08/2021  Modified Barium Swallow completed.  Full report located under Chart Review in the Imaging Section.  Brief recommendations include the following:  Clinical Impression  Pt presents with a moderate oropharyngeal dysphagia. Her oral phase is characterized by reduced bolus cohesion, posterior propulsion, and lingual pumping that results in lingual residue across most consistencies. Premature spillage was also noted with liquids, which spilled into her pharynx and then filled her valleculae and pyriform sinuses. This spillage as well as reduced laryngeal closure allowed for boluses, especially liquids, to travel over her arytenoids and into her airway leading to aspiration of thin liquids (PAS 8) as well as consistent penetration of thicker liquids (PAS 2,3,5). She also has reduced epiglottic inversion and base of tongue retraction that resulted in significant pharyngeal residue, primarily in the valleculae, and especially with thicker and more solid consistencies. This residue led to further penetration with all consistencies following her initial swallows; however, penetration of residue only progressed to aspiration x1 (PAS 8) with nectar thick liquids. No other aspiration occurred with nectar thick liquids even when challenged with multiple consecutive sips via cup. Several strategies were attempted to increase airway protection, including a chin tuck as well as volitional coughs and second swallows. These were effective at increasing airway protection or at least moving penetrates further away from her vocal folds. Additional swallows also helped to reduce some of her pharyngeal residue. Question her ability to carry over these strategies throughout a meal. Of note, today revealed a more significant dysphagia compared to MBS that was conducted during recent  admission, with increased residue and airway invasion. Ultimate diet recommendation will be deferred to primary SLP who will know what pt appears to have been tolerating at SNF (no PNA or significant weight loss per son), but based on these findings, would recommend considering adjustment to at least DYS 2 solids and nectar thick liquids via cup (no straw). She may also benefit from two swallows per bolus, a chin tuck with liquids, and cues to cough, with full supervision during mealtimes to encourage the use of swallowing strategies.    Swallow Evaluation Recommendations       SLP Diet Recommendations: Dysphagia 2 (Fine chop) solids;Nectar thick liquid   Liquid Administration via: Cup;No straw   Medication Administration: Crushed with puree   Supervision: Patient able to self feed;Full supervision/cueing for compensatory strategies   Compensations: Slow rate;Small sips/bites   Postural Changes: Remain semi-upright after after feeds/meals (Comment);Seated upright at 90 degrees   Oral Care Recommendations: Oral care BID        Jeanine Luz., SLP Student 04/08/2021,3:27 PM

## 2021-04-14 DIAGNOSIS — R1310 Dysphagia, unspecified: Secondary | ICD-10-CM | POA: Diagnosis not present

## 2021-04-14 DIAGNOSIS — D500 Iron deficiency anemia secondary to blood loss (chronic): Secondary | ICD-10-CM | POA: Diagnosis not present

## 2021-04-14 DIAGNOSIS — R0781 Pleurodynia: Secondary | ICD-10-CM | POA: Diagnosis not present

## 2021-04-14 DIAGNOSIS — E785 Hyperlipidemia, unspecified: Secondary | ICD-10-CM | POA: Diagnosis not present

## 2021-04-14 DIAGNOSIS — F0390 Unspecified dementia without behavioral disturbance: Secondary | ICD-10-CM | POA: Diagnosis not present

## 2021-04-14 DIAGNOSIS — I10 Essential (primary) hypertension: Secondary | ICD-10-CM | POA: Diagnosis not present

## 2021-04-14 DIAGNOSIS — R5381 Other malaise: Secondary | ICD-10-CM | POA: Diagnosis not present

## 2021-04-14 DIAGNOSIS — R58 Hemorrhage, not elsewhere classified: Secondary | ICD-10-CM | POA: Diagnosis not present

## 2021-04-14 DIAGNOSIS — N189 Chronic kidney disease, unspecified: Secondary | ICD-10-CM | POA: Diagnosis not present

## 2021-04-14 DIAGNOSIS — F419 Anxiety disorder, unspecified: Secondary | ICD-10-CM | POA: Diagnosis not present

## 2021-04-28 DIAGNOSIS — G300 Alzheimer's disease with early onset: Secondary | ICD-10-CM | POA: Diagnosis not present

## 2021-04-28 DIAGNOSIS — R1310 Dysphagia, unspecified: Secondary | ICD-10-CM | POA: Diagnosis not present

## 2021-04-28 DIAGNOSIS — F0390 Unspecified dementia without behavioral disturbance: Secondary | ICD-10-CM | POA: Diagnosis not present

## 2021-04-28 DIAGNOSIS — R4589 Other symptoms and signs involving emotional state: Secondary | ICD-10-CM | POA: Diagnosis not present

## 2021-04-28 DIAGNOSIS — F29 Unspecified psychosis not due to a substance or known physiological condition: Secondary | ICD-10-CM | POA: Diagnosis not present

## 2021-04-28 DIAGNOSIS — K449 Diaphragmatic hernia without obstruction or gangrene: Secondary | ICD-10-CM | POA: Diagnosis not present

## 2021-04-28 DIAGNOSIS — D500 Iron deficiency anemia secondary to blood loss (chronic): Secondary | ICD-10-CM | POA: Diagnosis not present

## 2021-04-28 DIAGNOSIS — F3112 Bipolar disorder, current episode manic without psychotic features, moderate: Secondary | ICD-10-CM | POA: Diagnosis not present

## 2021-04-28 DIAGNOSIS — F0391 Unspecified dementia with behavioral disturbance: Secondary | ICD-10-CM | POA: Diagnosis not present

## 2021-04-28 DIAGNOSIS — E039 Hypothyroidism, unspecified: Secondary | ICD-10-CM | POA: Diagnosis not present

## 2021-04-28 DIAGNOSIS — E559 Vitamin D deficiency, unspecified: Secondary | ICD-10-CM | POA: Diagnosis not present

## 2021-04-28 DIAGNOSIS — I10 Essential (primary) hypertension: Secondary | ICD-10-CM | POA: Diagnosis not present

## 2021-04-28 DIAGNOSIS — K219 Gastro-esophageal reflux disease without esophagitis: Secondary | ICD-10-CM | POA: Diagnosis not present

## 2021-04-28 DIAGNOSIS — N390 Urinary tract infection, site not specified: Secondary | ICD-10-CM | POA: Diagnosis not present

## 2021-05-01 DIAGNOSIS — N390 Urinary tract infection, site not specified: Secondary | ICD-10-CM | POA: Diagnosis not present

## 2021-05-07 DIAGNOSIS — S72451K Displaced supracondylar fracture without intracondylar extension of lower end of right femur, subsequent encounter for closed fracture with nonunion: Secondary | ICD-10-CM | POA: Diagnosis not present

## 2021-05-15 DIAGNOSIS — I10 Essential (primary) hypertension: Secondary | ICD-10-CM | POA: Diagnosis not present

## 2021-05-15 DIAGNOSIS — N390 Urinary tract infection, site not specified: Secondary | ICD-10-CM | POA: Diagnosis not present

## 2021-05-15 DIAGNOSIS — E039 Hypothyroidism, unspecified: Secondary | ICD-10-CM | POA: Diagnosis not present

## 2021-05-15 DIAGNOSIS — N189 Chronic kidney disease, unspecified: Secondary | ICD-10-CM | POA: Diagnosis not present

## 2021-05-15 DIAGNOSIS — R350 Frequency of micturition: Secondary | ICD-10-CM | POA: Diagnosis not present

## 2021-05-15 DIAGNOSIS — K219 Gastro-esophageal reflux disease without esophagitis: Secondary | ICD-10-CM | POA: Diagnosis not present

## 2021-05-16 DIAGNOSIS — R0989 Other specified symptoms and signs involving the circulatory and respiratory systems: Secondary | ICD-10-CM | POA: Diagnosis not present

## 2021-05-16 DIAGNOSIS — R4182 Altered mental status, unspecified: Secondary | ICD-10-CM | POA: Diagnosis not present

## 2021-05-16 DIAGNOSIS — R062 Wheezing: Secondary | ICD-10-CM | POA: Diagnosis not present

## 2021-05-20 DIAGNOSIS — F0390 Unspecified dementia without behavioral disturbance: Secondary | ICD-10-CM | POA: Diagnosis not present

## 2021-05-20 DIAGNOSIS — F0391 Unspecified dementia with behavioral disturbance: Secondary | ICD-10-CM | POA: Diagnosis not present

## 2021-05-20 DIAGNOSIS — E039 Hypothyroidism, unspecified: Secondary | ICD-10-CM | POA: Diagnosis not present

## 2021-05-20 DIAGNOSIS — N189 Chronic kidney disease, unspecified: Secondary | ICD-10-CM | POA: Diagnosis not present

## 2021-05-20 DIAGNOSIS — E785 Hyperlipidemia, unspecified: Secondary | ICD-10-CM | POA: Diagnosis not present

## 2021-05-20 DIAGNOSIS — I10 Essential (primary) hypertension: Secondary | ICD-10-CM | POA: Diagnosis not present

## 2021-05-20 DIAGNOSIS — E559 Vitamin D deficiency, unspecified: Secondary | ICD-10-CM | POA: Diagnosis not present

## 2021-05-20 DIAGNOSIS — D500 Iron deficiency anemia secondary to blood loss (chronic): Secondary | ICD-10-CM | POA: Diagnosis not present

## 2021-05-26 DIAGNOSIS — E039 Hypothyroidism, unspecified: Secondary | ICD-10-CM | POA: Diagnosis not present

## 2021-05-28 DIAGNOSIS — R443 Hallucinations, unspecified: Secondary | ICD-10-CM | POA: Diagnosis not present

## 2021-05-28 DIAGNOSIS — N189 Chronic kidney disease, unspecified: Secondary | ICD-10-CM | POA: Diagnosis not present

## 2021-05-28 DIAGNOSIS — I10 Essential (primary) hypertension: Secondary | ICD-10-CM | POA: Diagnosis not present

## 2021-05-28 DIAGNOSIS — K5900 Constipation, unspecified: Secondary | ICD-10-CM | POA: Diagnosis not present

## 2021-05-28 DIAGNOSIS — R1310 Dysphagia, unspecified: Secondary | ICD-10-CM | POA: Diagnosis not present

## 2021-05-28 DIAGNOSIS — E559 Vitamin D deficiency, unspecified: Secondary | ICD-10-CM | POA: Diagnosis not present

## 2021-05-28 DIAGNOSIS — D649 Anemia, unspecified: Secondary | ICD-10-CM | POA: Diagnosis not present

## 2021-05-28 DIAGNOSIS — N390 Urinary tract infection, site not specified: Secondary | ICD-10-CM | POA: Diagnosis not present

## 2021-06-05 DIAGNOSIS — D500 Iron deficiency anemia secondary to blood loss (chronic): Secondary | ICD-10-CM | POA: Diagnosis not present

## 2021-06-05 DIAGNOSIS — E559 Vitamin D deficiency, unspecified: Secondary | ICD-10-CM | POA: Diagnosis not present

## 2021-06-05 DIAGNOSIS — F29 Unspecified psychosis not due to a substance or known physiological condition: Secondary | ICD-10-CM | POA: Diagnosis not present

## 2021-06-05 DIAGNOSIS — E039 Hypothyroidism, unspecified: Secondary | ICD-10-CM | POA: Diagnosis not present

## 2021-06-05 DIAGNOSIS — F0390 Unspecified dementia without behavioral disturbance: Secondary | ICD-10-CM | POA: Diagnosis not present

## 2021-06-05 DIAGNOSIS — F0391 Unspecified dementia with behavioral disturbance: Secondary | ICD-10-CM | POA: Diagnosis not present

## 2021-06-05 DIAGNOSIS — G300 Alzheimer's disease with early onset: Secondary | ICD-10-CM | POA: Diagnosis not present

## 2021-06-05 DIAGNOSIS — E785 Hyperlipidemia, unspecified: Secondary | ICD-10-CM | POA: Diagnosis not present

## 2021-06-05 DIAGNOSIS — I10 Essential (primary) hypertension: Secondary | ICD-10-CM | POA: Diagnosis not present

## 2021-06-05 DIAGNOSIS — F3112 Bipolar disorder, current episode manic without psychotic features, moderate: Secondary | ICD-10-CM | POA: Diagnosis not present

## 2021-06-05 DIAGNOSIS — N189 Chronic kidney disease, unspecified: Secondary | ICD-10-CM | POA: Diagnosis not present

## 2021-06-16 DIAGNOSIS — R1310 Dysphagia, unspecified: Secondary | ICD-10-CM | POA: Diagnosis not present

## 2021-06-16 DIAGNOSIS — R443 Hallucinations, unspecified: Secondary | ICD-10-CM | POA: Diagnosis not present

## 2021-06-16 DIAGNOSIS — E559 Vitamin D deficiency, unspecified: Secondary | ICD-10-CM | POA: Diagnosis not present

## 2021-06-16 DIAGNOSIS — K449 Diaphragmatic hernia without obstruction or gangrene: Secondary | ICD-10-CM | POA: Diagnosis not present

## 2021-06-16 DIAGNOSIS — I10 Essential (primary) hypertension: Secondary | ICD-10-CM | POA: Diagnosis not present

## 2021-06-16 DIAGNOSIS — N390 Urinary tract infection, site not specified: Secondary | ICD-10-CM | POA: Diagnosis not present

## 2021-06-16 DIAGNOSIS — N189 Chronic kidney disease, unspecified: Secondary | ICD-10-CM | POA: Diagnosis not present

## 2021-06-24 DIAGNOSIS — R41841 Cognitive communication deficit: Secondary | ICD-10-CM | POA: Diagnosis not present

## 2021-06-24 DIAGNOSIS — R1312 Dysphagia, oropharyngeal phase: Secondary | ICD-10-CM | POA: Diagnosis not present

## 2021-06-24 DIAGNOSIS — F0390 Unspecified dementia without behavioral disturbance: Secondary | ICD-10-CM | POA: Diagnosis not present

## 2021-06-24 DIAGNOSIS — S72401D Unspecified fracture of lower end of right femur, subsequent encounter for closed fracture with routine healing: Secondary | ICD-10-CM | POA: Diagnosis not present

## 2021-06-25 DIAGNOSIS — F32A Depression, unspecified: Secondary | ICD-10-CM | POA: Diagnosis not present

## 2021-06-25 DIAGNOSIS — K5900 Constipation, unspecified: Secondary | ICD-10-CM | POA: Diagnosis not present

## 2021-06-25 DIAGNOSIS — R443 Hallucinations, unspecified: Secondary | ICD-10-CM | POA: Diagnosis not present

## 2021-06-25 DIAGNOSIS — S72401D Unspecified fracture of lower end of right femur, subsequent encounter for closed fracture with routine healing: Secondary | ICD-10-CM | POA: Diagnosis not present

## 2021-06-25 DIAGNOSIS — R0902 Hypoxemia: Secondary | ICD-10-CM | POA: Diagnosis not present

## 2021-06-25 DIAGNOSIS — F0390 Unspecified dementia without behavioral disturbance: Secondary | ICD-10-CM | POA: Diagnosis not present

## 2021-06-25 DIAGNOSIS — R41841 Cognitive communication deficit: Secondary | ICD-10-CM | POA: Diagnosis not present

## 2021-06-25 DIAGNOSIS — E785 Hyperlipidemia, unspecified: Secondary | ICD-10-CM | POA: Diagnosis not present

## 2021-06-25 DIAGNOSIS — R1312 Dysphagia, oropharyngeal phase: Secondary | ICD-10-CM | POA: Diagnosis not present

## 2021-06-25 DIAGNOSIS — K219 Gastro-esophageal reflux disease without esophagitis: Secondary | ICD-10-CM | POA: Diagnosis not present

## 2021-06-26 DIAGNOSIS — F0390 Unspecified dementia without behavioral disturbance: Secondary | ICD-10-CM | POA: Diagnosis not present

## 2021-06-26 DIAGNOSIS — R41841 Cognitive communication deficit: Secondary | ICD-10-CM | POA: Diagnosis not present

## 2021-06-26 DIAGNOSIS — S72401D Unspecified fracture of lower end of right femur, subsequent encounter for closed fracture with routine healing: Secondary | ICD-10-CM | POA: Diagnosis not present

## 2021-06-26 DIAGNOSIS — R1312 Dysphagia, oropharyngeal phase: Secondary | ICD-10-CM | POA: Diagnosis not present

## 2021-06-27 DIAGNOSIS — S72401D Unspecified fracture of lower end of right femur, subsequent encounter for closed fracture with routine healing: Secondary | ICD-10-CM | POA: Diagnosis not present

## 2021-06-27 DIAGNOSIS — F0390 Unspecified dementia without behavioral disturbance: Secondary | ICD-10-CM | POA: Diagnosis not present

## 2021-06-27 DIAGNOSIS — R1312 Dysphagia, oropharyngeal phase: Secondary | ICD-10-CM | POA: Diagnosis not present

## 2021-06-27 DIAGNOSIS — R41841 Cognitive communication deficit: Secondary | ICD-10-CM | POA: Diagnosis not present

## 2021-06-30 DIAGNOSIS — E785 Hyperlipidemia, unspecified: Secondary | ICD-10-CM | POA: Diagnosis not present

## 2021-06-30 DIAGNOSIS — N189 Chronic kidney disease, unspecified: Secondary | ICD-10-CM | POA: Diagnosis not present

## 2021-06-30 DIAGNOSIS — I10 Essential (primary) hypertension: Secondary | ICD-10-CM | POA: Diagnosis not present

## 2021-06-30 DIAGNOSIS — E039 Hypothyroidism, unspecified: Secondary | ICD-10-CM | POA: Diagnosis not present

## 2021-06-30 DIAGNOSIS — D500 Iron deficiency anemia secondary to blood loss (chronic): Secondary | ICD-10-CM | POA: Diagnosis not present

## 2021-06-30 DIAGNOSIS — F0391 Unspecified dementia with behavioral disturbance: Secondary | ICD-10-CM | POA: Diagnosis not present

## 2021-06-30 DIAGNOSIS — F0390 Unspecified dementia without behavioral disturbance: Secondary | ICD-10-CM | POA: Diagnosis not present

## 2021-06-30 DIAGNOSIS — E559 Vitamin D deficiency, unspecified: Secondary | ICD-10-CM | POA: Diagnosis not present

## 2021-07-01 DIAGNOSIS — R41841 Cognitive communication deficit: Secondary | ICD-10-CM | POA: Diagnosis not present

## 2021-07-01 DIAGNOSIS — R1312 Dysphagia, oropharyngeal phase: Secondary | ICD-10-CM | POA: Diagnosis not present

## 2021-07-01 DIAGNOSIS — S72401D Unspecified fracture of lower end of right femur, subsequent encounter for closed fracture with routine healing: Secondary | ICD-10-CM | POA: Diagnosis not present

## 2021-07-01 DIAGNOSIS — F0390 Unspecified dementia without behavioral disturbance: Secondary | ICD-10-CM | POA: Diagnosis not present

## 2021-07-02 DIAGNOSIS — R41841 Cognitive communication deficit: Secondary | ICD-10-CM | POA: Diagnosis not present

## 2021-07-02 DIAGNOSIS — R1312 Dysphagia, oropharyngeal phase: Secondary | ICD-10-CM | POA: Diagnosis not present

## 2021-07-02 DIAGNOSIS — F0390 Unspecified dementia without behavioral disturbance: Secondary | ICD-10-CM | POA: Diagnosis not present

## 2021-07-02 DIAGNOSIS — S72401D Unspecified fracture of lower end of right femur, subsequent encounter for closed fracture with routine healing: Secondary | ICD-10-CM | POA: Diagnosis not present

## 2021-07-03 DIAGNOSIS — F0390 Unspecified dementia without behavioral disturbance: Secondary | ICD-10-CM | POA: Diagnosis not present

## 2021-07-03 DIAGNOSIS — R1312 Dysphagia, oropharyngeal phase: Secondary | ICD-10-CM | POA: Diagnosis not present

## 2021-07-03 DIAGNOSIS — S72401D Unspecified fracture of lower end of right femur, subsequent encounter for closed fracture with routine healing: Secondary | ICD-10-CM | POA: Diagnosis not present

## 2021-07-03 DIAGNOSIS — R41841 Cognitive communication deficit: Secondary | ICD-10-CM | POA: Diagnosis not present

## 2021-07-04 DIAGNOSIS — R1312 Dysphagia, oropharyngeal phase: Secondary | ICD-10-CM | POA: Diagnosis not present

## 2021-07-04 DIAGNOSIS — R41841 Cognitive communication deficit: Secondary | ICD-10-CM | POA: Diagnosis not present

## 2021-07-04 DIAGNOSIS — F0390 Unspecified dementia without behavioral disturbance: Secondary | ICD-10-CM | POA: Diagnosis not present

## 2021-07-04 DIAGNOSIS — S72401D Unspecified fracture of lower end of right femur, subsequent encounter for closed fracture with routine healing: Secondary | ICD-10-CM | POA: Diagnosis not present

## 2021-07-07 DIAGNOSIS — F0390 Unspecified dementia without behavioral disturbance: Secondary | ICD-10-CM | POA: Diagnosis not present

## 2021-07-07 DIAGNOSIS — S72401D Unspecified fracture of lower end of right femur, subsequent encounter for closed fracture with routine healing: Secondary | ICD-10-CM | POA: Diagnosis not present

## 2021-07-07 DIAGNOSIS — R1312 Dysphagia, oropharyngeal phase: Secondary | ICD-10-CM | POA: Diagnosis not present

## 2021-07-07 DIAGNOSIS — R41841 Cognitive communication deficit: Secondary | ICD-10-CM | POA: Diagnosis not present

## 2021-07-08 DIAGNOSIS — S72401D Unspecified fracture of lower end of right femur, subsequent encounter for closed fracture with routine healing: Secondary | ICD-10-CM | POA: Diagnosis not present

## 2021-07-08 DIAGNOSIS — F0390 Unspecified dementia without behavioral disturbance: Secondary | ICD-10-CM | POA: Diagnosis not present

## 2021-07-08 DIAGNOSIS — R1312 Dysphagia, oropharyngeal phase: Secondary | ICD-10-CM | POA: Diagnosis not present

## 2021-07-08 DIAGNOSIS — R41841 Cognitive communication deficit: Secondary | ICD-10-CM | POA: Diagnosis not present

## 2021-07-09 DIAGNOSIS — R1312 Dysphagia, oropharyngeal phase: Secondary | ICD-10-CM | POA: Diagnosis not present

## 2021-07-09 DIAGNOSIS — F0390 Unspecified dementia without behavioral disturbance: Secondary | ICD-10-CM | POA: Diagnosis not present

## 2021-07-09 DIAGNOSIS — S72401D Unspecified fracture of lower end of right femur, subsequent encounter for closed fracture with routine healing: Secondary | ICD-10-CM | POA: Diagnosis not present

## 2021-07-09 DIAGNOSIS — R41841 Cognitive communication deficit: Secondary | ICD-10-CM | POA: Diagnosis not present

## 2021-07-11 DIAGNOSIS — F3112 Bipolar disorder, current episode manic without psychotic features, moderate: Secondary | ICD-10-CM | POA: Diagnosis not present

## 2021-07-11 DIAGNOSIS — F29 Unspecified psychosis not due to a substance or known physiological condition: Secondary | ICD-10-CM | POA: Diagnosis not present

## 2021-07-11 DIAGNOSIS — G300 Alzheimer's disease with early onset: Secondary | ICD-10-CM | POA: Diagnosis not present

## 2021-07-14 DIAGNOSIS — S72401D Unspecified fracture of lower end of right femur, subsequent encounter for closed fracture with routine healing: Secondary | ICD-10-CM | POA: Diagnosis not present

## 2021-07-14 DIAGNOSIS — R41841 Cognitive communication deficit: Secondary | ICD-10-CM | POA: Diagnosis not present

## 2021-07-14 DIAGNOSIS — G300 Alzheimer's disease with early onset: Secondary | ICD-10-CM | POA: Diagnosis not present

## 2021-07-14 DIAGNOSIS — R1312 Dysphagia, oropharyngeal phase: Secondary | ICD-10-CM | POA: Diagnosis not present

## 2021-07-14 DIAGNOSIS — F3112 Bipolar disorder, current episode manic without psychotic features, moderate: Secondary | ICD-10-CM | POA: Diagnosis not present

## 2021-07-14 DIAGNOSIS — F0390 Unspecified dementia without behavioral disturbance: Secondary | ICD-10-CM | POA: Diagnosis not present

## 2021-07-15 DIAGNOSIS — F0390 Unspecified dementia without behavioral disturbance: Secondary | ICD-10-CM | POA: Diagnosis not present

## 2021-07-15 DIAGNOSIS — S72401D Unspecified fracture of lower end of right femur, subsequent encounter for closed fracture with routine healing: Secondary | ICD-10-CM | POA: Diagnosis not present

## 2021-07-15 DIAGNOSIS — R41841 Cognitive communication deficit: Secondary | ICD-10-CM | POA: Diagnosis not present

## 2021-07-15 DIAGNOSIS — R1312 Dysphagia, oropharyngeal phase: Secondary | ICD-10-CM | POA: Diagnosis not present

## 2021-07-17 DIAGNOSIS — E039 Hypothyroidism, unspecified: Secondary | ICD-10-CM | POA: Diagnosis not present

## 2021-07-17 DIAGNOSIS — R1312 Dysphagia, oropharyngeal phase: Secondary | ICD-10-CM | POA: Diagnosis not present

## 2021-07-17 DIAGNOSIS — E559 Vitamin D deficiency, unspecified: Secondary | ICD-10-CM | POA: Diagnosis not present

## 2021-07-17 DIAGNOSIS — F0390 Unspecified dementia without behavioral disturbance: Secondary | ICD-10-CM | POA: Diagnosis not present

## 2021-07-17 DIAGNOSIS — R1310 Dysphagia, unspecified: Secondary | ICD-10-CM | POA: Diagnosis not present

## 2021-07-17 DIAGNOSIS — E785 Hyperlipidemia, unspecified: Secondary | ICD-10-CM | POA: Diagnosis not present

## 2021-07-17 DIAGNOSIS — R41841 Cognitive communication deficit: Secondary | ICD-10-CM | POA: Diagnosis not present

## 2021-07-17 DIAGNOSIS — N189 Chronic kidney disease, unspecified: Secondary | ICD-10-CM | POA: Diagnosis not present

## 2021-07-17 DIAGNOSIS — S72401D Unspecified fracture of lower end of right femur, subsequent encounter for closed fracture with routine healing: Secondary | ICD-10-CM | POA: Diagnosis not present

## 2021-07-17 DIAGNOSIS — N390 Urinary tract infection, site not specified: Secondary | ICD-10-CM | POA: Diagnosis not present

## 2021-07-17 DIAGNOSIS — K5900 Constipation, unspecified: Secondary | ICD-10-CM | POA: Diagnosis not present

## 2021-07-17 DIAGNOSIS — K219 Gastro-esophageal reflux disease without esophagitis: Secondary | ICD-10-CM | POA: Diagnosis not present

## 2021-07-17 DIAGNOSIS — R0902 Hypoxemia: Secondary | ICD-10-CM | POA: Diagnosis not present

## 2021-07-17 DIAGNOSIS — I10 Essential (primary) hypertension: Secondary | ICD-10-CM | POA: Diagnosis not present

## 2021-07-18 DIAGNOSIS — R41841 Cognitive communication deficit: Secondary | ICD-10-CM | POA: Diagnosis not present

## 2021-07-18 DIAGNOSIS — F0390 Unspecified dementia without behavioral disturbance: Secondary | ICD-10-CM | POA: Diagnosis not present

## 2021-07-18 DIAGNOSIS — R1312 Dysphagia, oropharyngeal phase: Secondary | ICD-10-CM | POA: Diagnosis not present

## 2021-07-18 DIAGNOSIS — S72401D Unspecified fracture of lower end of right femur, subsequent encounter for closed fracture with routine healing: Secondary | ICD-10-CM | POA: Diagnosis not present

## 2021-07-21 DIAGNOSIS — R1312 Dysphagia, oropharyngeal phase: Secondary | ICD-10-CM | POA: Diagnosis not present

## 2021-07-21 DIAGNOSIS — R41841 Cognitive communication deficit: Secondary | ICD-10-CM | POA: Diagnosis not present

## 2021-07-21 DIAGNOSIS — F0280 Dementia in other diseases classified elsewhere without behavioral disturbance: Secondary | ICD-10-CM | POA: Diagnosis not present

## 2021-07-21 DIAGNOSIS — S72401D Unspecified fracture of lower end of right femur, subsequent encounter for closed fracture with routine healing: Secondary | ICD-10-CM | POA: Diagnosis not present

## 2021-07-22 DIAGNOSIS — S72401D Unspecified fracture of lower end of right femur, subsequent encounter for closed fracture with routine healing: Secondary | ICD-10-CM | POA: Diagnosis not present

## 2021-07-22 DIAGNOSIS — R41841 Cognitive communication deficit: Secondary | ICD-10-CM | POA: Diagnosis not present

## 2021-07-22 DIAGNOSIS — R1312 Dysphagia, oropharyngeal phase: Secondary | ICD-10-CM | POA: Diagnosis not present

## 2021-07-22 DIAGNOSIS — F0280 Dementia in other diseases classified elsewhere without behavioral disturbance: Secondary | ICD-10-CM | POA: Diagnosis not present

## 2021-07-23 DIAGNOSIS — I10 Essential (primary) hypertension: Secondary | ICD-10-CM | POA: Diagnosis not present

## 2021-07-23 DIAGNOSIS — E785 Hyperlipidemia, unspecified: Secondary | ICD-10-CM | POA: Diagnosis not present

## 2021-07-23 DIAGNOSIS — R443 Hallucinations, unspecified: Secondary | ICD-10-CM | POA: Diagnosis not present

## 2021-07-23 DIAGNOSIS — K219 Gastro-esophageal reflux disease without esophagitis: Secondary | ICD-10-CM | POA: Diagnosis not present

## 2021-07-23 DIAGNOSIS — R1310 Dysphagia, unspecified: Secondary | ICD-10-CM | POA: Diagnosis not present

## 2021-07-23 DIAGNOSIS — R1312 Dysphagia, oropharyngeal phase: Secondary | ICD-10-CM | POA: Diagnosis not present

## 2021-07-23 DIAGNOSIS — D649 Anemia, unspecified: Secondary | ICD-10-CM | POA: Diagnosis not present

## 2021-07-23 DIAGNOSIS — R41841 Cognitive communication deficit: Secondary | ICD-10-CM | POA: Diagnosis not present

## 2021-07-23 DIAGNOSIS — E039 Hypothyroidism, unspecified: Secondary | ICD-10-CM | POA: Diagnosis not present

## 2021-07-23 DIAGNOSIS — N189 Chronic kidney disease, unspecified: Secondary | ICD-10-CM | POA: Diagnosis not present

## 2021-07-23 DIAGNOSIS — N390 Urinary tract infection, site not specified: Secondary | ICD-10-CM | POA: Diagnosis not present

## 2021-07-23 DIAGNOSIS — F0280 Dementia in other diseases classified elsewhere without behavioral disturbance: Secondary | ICD-10-CM | POA: Diagnosis not present

## 2021-07-23 DIAGNOSIS — E559 Vitamin D deficiency, unspecified: Secondary | ICD-10-CM | POA: Diagnosis not present

## 2021-07-23 DIAGNOSIS — S72401D Unspecified fracture of lower end of right femur, subsequent encounter for closed fracture with routine healing: Secondary | ICD-10-CM | POA: Diagnosis not present

## 2021-07-23 DIAGNOSIS — K5900 Constipation, unspecified: Secondary | ICD-10-CM | POA: Diagnosis not present

## 2021-07-25 DIAGNOSIS — E039 Hypothyroidism, unspecified: Secondary | ICD-10-CM | POA: Diagnosis not present

## 2021-07-25 DIAGNOSIS — N189 Chronic kidney disease, unspecified: Secondary | ICD-10-CM | POA: Diagnosis not present

## 2021-07-25 DIAGNOSIS — F0390 Unspecified dementia without behavioral disturbance: Secondary | ICD-10-CM | POA: Diagnosis not present

## 2021-07-25 DIAGNOSIS — I10 Essential (primary) hypertension: Secondary | ICD-10-CM | POA: Diagnosis not present

## 2021-07-25 DIAGNOSIS — F0391 Unspecified dementia with behavioral disturbance: Secondary | ICD-10-CM | POA: Diagnosis not present

## 2021-07-25 DIAGNOSIS — E559 Vitamin D deficiency, unspecified: Secondary | ICD-10-CM | POA: Diagnosis not present

## 2021-07-25 DIAGNOSIS — D500 Iron deficiency anemia secondary to blood loss (chronic): Secondary | ICD-10-CM | POA: Diagnosis not present

## 2021-07-25 DIAGNOSIS — E785 Hyperlipidemia, unspecified: Secondary | ICD-10-CM | POA: Diagnosis not present

## 2021-07-30 DIAGNOSIS — F0280 Dementia in other diseases classified elsewhere without behavioral disturbance: Secondary | ICD-10-CM | POA: Diagnosis not present

## 2021-07-30 DIAGNOSIS — R1312 Dysphagia, oropharyngeal phase: Secondary | ICD-10-CM | POA: Diagnosis not present

## 2021-07-30 DIAGNOSIS — R41841 Cognitive communication deficit: Secondary | ICD-10-CM | POA: Diagnosis not present

## 2021-07-30 DIAGNOSIS — S72401D Unspecified fracture of lower end of right femur, subsequent encounter for closed fracture with routine healing: Secondary | ICD-10-CM | POA: Diagnosis not present

## 2021-07-31 DIAGNOSIS — R41841 Cognitive communication deficit: Secondary | ICD-10-CM | POA: Diagnosis not present

## 2021-07-31 DIAGNOSIS — R1312 Dysphagia, oropharyngeal phase: Secondary | ICD-10-CM | POA: Diagnosis not present

## 2021-07-31 DIAGNOSIS — F0280 Dementia in other diseases classified elsewhere without behavioral disturbance: Secondary | ICD-10-CM | POA: Diagnosis not present

## 2021-07-31 DIAGNOSIS — S72401D Unspecified fracture of lower end of right femur, subsequent encounter for closed fracture with routine healing: Secondary | ICD-10-CM | POA: Diagnosis not present

## 2021-08-01 DIAGNOSIS — R41841 Cognitive communication deficit: Secondary | ICD-10-CM | POA: Diagnosis not present

## 2021-08-01 DIAGNOSIS — F0280 Dementia in other diseases classified elsewhere without behavioral disturbance: Secondary | ICD-10-CM | POA: Diagnosis not present

## 2021-08-01 DIAGNOSIS — R1312 Dysphagia, oropharyngeal phase: Secondary | ICD-10-CM | POA: Diagnosis not present

## 2021-08-01 DIAGNOSIS — S72401D Unspecified fracture of lower end of right femur, subsequent encounter for closed fracture with routine healing: Secondary | ICD-10-CM | POA: Diagnosis not present

## 2021-08-04 DIAGNOSIS — F0280 Dementia in other diseases classified elsewhere without behavioral disturbance: Secondary | ICD-10-CM | POA: Diagnosis not present

## 2021-08-04 DIAGNOSIS — R1312 Dysphagia, oropharyngeal phase: Secondary | ICD-10-CM | POA: Diagnosis not present

## 2021-08-04 DIAGNOSIS — S72401D Unspecified fracture of lower end of right femur, subsequent encounter for closed fracture with routine healing: Secondary | ICD-10-CM | POA: Diagnosis not present

## 2021-08-04 DIAGNOSIS — R41841 Cognitive communication deficit: Secondary | ICD-10-CM | POA: Diagnosis not present

## 2021-08-05 DIAGNOSIS — R1312 Dysphagia, oropharyngeal phase: Secondary | ICD-10-CM | POA: Diagnosis not present

## 2021-08-05 DIAGNOSIS — S72401D Unspecified fracture of lower end of right femur, subsequent encounter for closed fracture with routine healing: Secondary | ICD-10-CM | POA: Diagnosis not present

## 2021-08-05 DIAGNOSIS — R41841 Cognitive communication deficit: Secondary | ICD-10-CM | POA: Diagnosis not present

## 2021-08-05 DIAGNOSIS — F0280 Dementia in other diseases classified elsewhere without behavioral disturbance: Secondary | ICD-10-CM | POA: Diagnosis not present

## 2021-08-06 DIAGNOSIS — F0280 Dementia in other diseases classified elsewhere without behavioral disturbance: Secondary | ICD-10-CM | POA: Diagnosis not present

## 2021-08-06 DIAGNOSIS — R1312 Dysphagia, oropharyngeal phase: Secondary | ICD-10-CM | POA: Diagnosis not present

## 2021-08-06 DIAGNOSIS — S72401D Unspecified fracture of lower end of right femur, subsequent encounter for closed fracture with routine healing: Secondary | ICD-10-CM | POA: Diagnosis not present

## 2021-08-06 DIAGNOSIS — R41841 Cognitive communication deficit: Secondary | ICD-10-CM | POA: Diagnosis not present

## 2021-08-12 DIAGNOSIS — F29 Unspecified psychosis not due to a substance or known physiological condition: Secondary | ICD-10-CM | POA: Diagnosis not present

## 2021-08-12 DIAGNOSIS — G300 Alzheimer's disease with early onset: Secondary | ICD-10-CM | POA: Diagnosis not present

## 2021-08-12 DIAGNOSIS — F3112 Bipolar disorder, current episode manic without psychotic features, moderate: Secondary | ICD-10-CM | POA: Diagnosis not present

## 2021-08-14 DIAGNOSIS — I10 Essential (primary) hypertension: Secondary | ICD-10-CM | POA: Diagnosis not present

## 2021-08-14 DIAGNOSIS — N189 Chronic kidney disease, unspecified: Secondary | ICD-10-CM | POA: Diagnosis not present

## 2021-08-14 DIAGNOSIS — E785 Hyperlipidemia, unspecified: Secondary | ICD-10-CM | POA: Diagnosis not present

## 2021-08-14 DIAGNOSIS — K219 Gastro-esophageal reflux disease without esophagitis: Secondary | ICD-10-CM | POA: Diagnosis not present

## 2021-08-14 DIAGNOSIS — N390 Urinary tract infection, site not specified: Secondary | ICD-10-CM | POA: Diagnosis not present

## 2021-08-14 DIAGNOSIS — R1310 Dysphagia, unspecified: Secondary | ICD-10-CM | POA: Diagnosis not present

## 2021-08-14 DIAGNOSIS — R443 Hallucinations, unspecified: Secondary | ICD-10-CM | POA: Diagnosis not present

## 2021-08-14 DIAGNOSIS — E039 Hypothyroidism, unspecified: Secondary | ICD-10-CM | POA: Diagnosis not present

## 2021-08-14 DIAGNOSIS — K5900 Constipation, unspecified: Secondary | ICD-10-CM | POA: Diagnosis not present

## 2021-08-14 DIAGNOSIS — E559 Vitamin D deficiency, unspecified: Secondary | ICD-10-CM | POA: Diagnosis not present

## 2021-08-19 DIAGNOSIS — D649 Anemia, unspecified: Secondary | ICD-10-CM | POA: Diagnosis not present

## 2021-08-20 DIAGNOSIS — E039 Hypothyroidism, unspecified: Secondary | ICD-10-CM | POA: Diagnosis not present

## 2021-08-20 DIAGNOSIS — D649 Anemia, unspecified: Secondary | ICD-10-CM | POA: Diagnosis not present

## 2021-08-20 DIAGNOSIS — E785 Hyperlipidemia, unspecified: Secondary | ICD-10-CM | POA: Diagnosis not present

## 2021-08-20 DIAGNOSIS — R443 Hallucinations, unspecified: Secondary | ICD-10-CM | POA: Diagnosis not present

## 2021-08-20 DIAGNOSIS — F32A Depression, unspecified: Secondary | ICD-10-CM | POA: Diagnosis not present

## 2021-08-20 DIAGNOSIS — N189 Chronic kidney disease, unspecified: Secondary | ICD-10-CM | POA: Diagnosis not present

## 2021-08-20 DIAGNOSIS — K5900 Constipation, unspecified: Secondary | ICD-10-CM | POA: Diagnosis not present

## 2021-08-20 DIAGNOSIS — K219 Gastro-esophageal reflux disease without esophagitis: Secondary | ICD-10-CM | POA: Diagnosis not present

## 2021-08-20 DIAGNOSIS — N390 Urinary tract infection, site not specified: Secondary | ICD-10-CM | POA: Diagnosis not present

## 2021-08-23 DIAGNOSIS — R059 Cough, unspecified: Secondary | ICD-10-CM | POA: Diagnosis not present

## 2021-08-26 DIAGNOSIS — E039 Hypothyroidism, unspecified: Secondary | ICD-10-CM | POA: Diagnosis not present

## 2021-08-26 DIAGNOSIS — F0391 Unspecified dementia with behavioral disturbance: Secondary | ICD-10-CM | POA: Diagnosis not present

## 2021-08-26 DIAGNOSIS — I10 Essential (primary) hypertension: Secondary | ICD-10-CM | POA: Diagnosis not present

## 2021-08-26 DIAGNOSIS — F0390 Unspecified dementia without behavioral disturbance: Secondary | ICD-10-CM | POA: Diagnosis not present

## 2021-08-26 DIAGNOSIS — R059 Cough, unspecified: Secondary | ICD-10-CM | POA: Diagnosis not present

## 2021-08-26 DIAGNOSIS — E785 Hyperlipidemia, unspecified: Secondary | ICD-10-CM | POA: Diagnosis not present

## 2021-08-26 DIAGNOSIS — D500 Iron deficiency anemia secondary to blood loss (chronic): Secondary | ICD-10-CM | POA: Diagnosis not present

## 2021-08-26 DIAGNOSIS — E559 Vitamin D deficiency, unspecified: Secondary | ICD-10-CM | POA: Diagnosis not present

## 2021-08-27 DIAGNOSIS — J189 Pneumonia, unspecified organism: Secondary | ICD-10-CM | POA: Diagnosis not present

## 2021-09-10 DIAGNOSIS — I10 Essential (primary) hypertension: Secondary | ICD-10-CM | POA: Diagnosis not present

## 2021-09-10 DIAGNOSIS — E039 Hypothyroidism, unspecified: Secondary | ICD-10-CM | POA: Diagnosis not present

## 2021-09-10 DIAGNOSIS — N189 Chronic kidney disease, unspecified: Secondary | ICD-10-CM | POA: Diagnosis not present

## 2021-09-10 DIAGNOSIS — E559 Vitamin D deficiency, unspecified: Secondary | ICD-10-CM | POA: Diagnosis not present

## 2021-09-16 DIAGNOSIS — G300 Alzheimer's disease with early onset: Secondary | ICD-10-CM | POA: Diagnosis not present

## 2021-09-16 DIAGNOSIS — F29 Unspecified psychosis not due to a substance or known physiological condition: Secondary | ICD-10-CM | POA: Diagnosis not present

## 2021-09-16 DIAGNOSIS — F3112 Bipolar disorder, current episode manic without psychotic features, moderate: Secondary | ICD-10-CM | POA: Diagnosis not present

## 2021-09-17 DIAGNOSIS — K5900 Constipation, unspecified: Secondary | ICD-10-CM | POA: Diagnosis not present

## 2021-09-17 DIAGNOSIS — R443 Hallucinations, unspecified: Secondary | ICD-10-CM | POA: Diagnosis not present

## 2021-09-17 DIAGNOSIS — K219 Gastro-esophageal reflux disease without esophagitis: Secondary | ICD-10-CM | POA: Diagnosis not present

## 2021-09-17 DIAGNOSIS — N390 Urinary tract infection, site not specified: Secondary | ICD-10-CM | POA: Diagnosis not present

## 2021-09-17 DIAGNOSIS — D649 Anemia, unspecified: Secondary | ICD-10-CM | POA: Diagnosis not present

## 2021-09-17 DIAGNOSIS — N189 Chronic kidney disease, unspecified: Secondary | ICD-10-CM | POA: Diagnosis not present

## 2021-09-17 DIAGNOSIS — R1310 Dysphagia, unspecified: Secondary | ICD-10-CM | POA: Diagnosis not present

## 2021-09-17 DIAGNOSIS — E785 Hyperlipidemia, unspecified: Secondary | ICD-10-CM | POA: Diagnosis not present

## 2021-09-26 DIAGNOSIS — E039 Hypothyroidism, unspecified: Secondary | ICD-10-CM | POA: Diagnosis not present

## 2021-09-26 DIAGNOSIS — F0390 Unspecified dementia without behavioral disturbance: Secondary | ICD-10-CM | POA: Diagnosis not present

## 2021-09-26 DIAGNOSIS — D500 Iron deficiency anemia secondary to blood loss (chronic): Secondary | ICD-10-CM | POA: Diagnosis not present

## 2021-09-26 DIAGNOSIS — E785 Hyperlipidemia, unspecified: Secondary | ICD-10-CM | POA: Diagnosis not present

## 2021-09-26 DIAGNOSIS — N189 Chronic kidney disease, unspecified: Secondary | ICD-10-CM | POA: Diagnosis not present

## 2021-09-26 DIAGNOSIS — I10 Essential (primary) hypertension: Secondary | ICD-10-CM | POA: Diagnosis not present

## 2021-10-09 DIAGNOSIS — R443 Hallucinations, unspecified: Secondary | ICD-10-CM | POA: Diagnosis not present

## 2021-10-09 DIAGNOSIS — N189 Chronic kidney disease, unspecified: Secondary | ICD-10-CM | POA: Diagnosis not present

## 2021-10-09 DIAGNOSIS — E039 Hypothyroidism, unspecified: Secondary | ICD-10-CM | POA: Diagnosis not present

## 2021-10-09 DIAGNOSIS — F0390 Unspecified dementia without behavioral disturbance: Secondary | ICD-10-CM | POA: Diagnosis not present

## 2021-10-09 DIAGNOSIS — R1310 Dysphagia, unspecified: Secondary | ICD-10-CM | POA: Diagnosis not present

## 2021-10-09 DIAGNOSIS — I10 Essential (primary) hypertension: Secondary | ICD-10-CM | POA: Diagnosis not present

## 2021-10-09 DIAGNOSIS — M25512 Pain in left shoulder: Secondary | ICD-10-CM | POA: Diagnosis not present

## 2021-10-09 DIAGNOSIS — E559 Vitamin D deficiency, unspecified: Secondary | ICD-10-CM | POA: Diagnosis not present

## 2021-10-09 DIAGNOSIS — N390 Urinary tract infection, site not specified: Secondary | ICD-10-CM | POA: Diagnosis not present

## 2021-10-09 DIAGNOSIS — D649 Anemia, unspecified: Secondary | ICD-10-CM | POA: Diagnosis not present

## 2021-10-14 DIAGNOSIS — F29 Unspecified psychosis not due to a substance or known physiological condition: Secondary | ICD-10-CM | POA: Diagnosis not present

## 2021-10-14 DIAGNOSIS — L89152 Pressure ulcer of sacral region, stage 2: Secondary | ICD-10-CM | POA: Diagnosis not present

## 2021-10-14 DIAGNOSIS — F3112 Bipolar disorder, current episode manic without psychotic features, moderate: Secondary | ICD-10-CM | POA: Diagnosis not present

## 2021-10-14 DIAGNOSIS — G300 Alzheimer's disease with early onset: Secondary | ICD-10-CM | POA: Diagnosis not present

## 2021-10-15 DIAGNOSIS — R1310 Dysphagia, unspecified: Secondary | ICD-10-CM | POA: Diagnosis not present

## 2021-10-15 DIAGNOSIS — N390 Urinary tract infection, site not specified: Secondary | ICD-10-CM | POA: Diagnosis not present

## 2021-10-15 DIAGNOSIS — D649 Anemia, unspecified: Secondary | ICD-10-CM | POA: Diagnosis not present

## 2021-10-15 DIAGNOSIS — E559 Vitamin D deficiency, unspecified: Secondary | ICD-10-CM | POA: Diagnosis not present

## 2021-10-15 DIAGNOSIS — R443 Hallucinations, unspecified: Secondary | ICD-10-CM | POA: Diagnosis not present

## 2021-10-15 DIAGNOSIS — I10 Essential (primary) hypertension: Secondary | ICD-10-CM | POA: Diagnosis not present

## 2021-10-15 DIAGNOSIS — E039 Hypothyroidism, unspecified: Secondary | ICD-10-CM | POA: Diagnosis not present

## 2021-10-15 DIAGNOSIS — N189 Chronic kidney disease, unspecified: Secondary | ICD-10-CM | POA: Diagnosis not present

## 2021-10-21 DIAGNOSIS — L89152 Pressure ulcer of sacral region, stage 2: Secondary | ICD-10-CM | POA: Diagnosis not present

## 2021-10-28 DIAGNOSIS — E039 Hypothyroidism, unspecified: Secondary | ICD-10-CM | POA: Diagnosis not present

## 2021-10-28 DIAGNOSIS — E785 Hyperlipidemia, unspecified: Secondary | ICD-10-CM | POA: Diagnosis not present

## 2021-10-28 DIAGNOSIS — L89152 Pressure ulcer of sacral region, stage 2: Secondary | ICD-10-CM | POA: Diagnosis not present

## 2021-10-28 DIAGNOSIS — I10 Essential (primary) hypertension: Secondary | ICD-10-CM | POA: Diagnosis not present

## 2021-10-28 DIAGNOSIS — N189 Chronic kidney disease, unspecified: Secondary | ICD-10-CM | POA: Diagnosis not present

## 2021-10-28 DIAGNOSIS — E559 Vitamin D deficiency, unspecified: Secondary | ICD-10-CM | POA: Diagnosis not present

## 2021-10-28 DIAGNOSIS — F0390 Unspecified dementia without behavioral disturbance: Secondary | ICD-10-CM | POA: Diagnosis not present

## 2021-10-28 DIAGNOSIS — D500 Iron deficiency anemia secondary to blood loss (chronic): Secondary | ICD-10-CM | POA: Diagnosis not present

## 2021-10-31 DIAGNOSIS — D518 Other vitamin B12 deficiency anemias: Secondary | ICD-10-CM | POA: Diagnosis not present

## 2021-10-31 DIAGNOSIS — I10 Essential (primary) hypertension: Secondary | ICD-10-CM | POA: Diagnosis not present

## 2021-10-31 DIAGNOSIS — R7989 Other specified abnormal findings of blood chemistry: Secondary | ICD-10-CM | POA: Diagnosis not present

## 2021-10-31 DIAGNOSIS — E559 Vitamin D deficiency, unspecified: Secondary | ICD-10-CM | POA: Diagnosis not present

## 2021-11-04 DIAGNOSIS — L89152 Pressure ulcer of sacral region, stage 2: Secondary | ICD-10-CM | POA: Diagnosis not present

## 2021-11-05 DIAGNOSIS — U071 COVID-19: Secondary | ICD-10-CM | POA: Diagnosis not present

## 2021-11-05 DIAGNOSIS — E785 Hyperlipidemia, unspecified: Secondary | ICD-10-CM | POA: Diagnosis not present

## 2021-11-05 DIAGNOSIS — R7989 Other specified abnormal findings of blood chemistry: Secondary | ICD-10-CM | POA: Diagnosis not present

## 2021-11-05 DIAGNOSIS — K219 Gastro-esophageal reflux disease without esophagitis: Secondary | ICD-10-CM | POA: Diagnosis not present

## 2021-11-05 DIAGNOSIS — D649 Anemia, unspecified: Secondary | ICD-10-CM | POA: Diagnosis not present

## 2021-11-05 DIAGNOSIS — R443 Hallucinations, unspecified: Secondary | ICD-10-CM | POA: Diagnosis not present

## 2021-11-05 DIAGNOSIS — F0390 Unspecified dementia without behavioral disturbance: Secondary | ICD-10-CM | POA: Diagnosis not present

## 2021-11-05 DIAGNOSIS — N189 Chronic kidney disease, unspecified: Secondary | ICD-10-CM | POA: Diagnosis not present

## 2021-11-05 DIAGNOSIS — K5900 Constipation, unspecified: Secondary | ICD-10-CM | POA: Diagnosis not present

## 2021-11-05 DIAGNOSIS — N390 Urinary tract infection, site not specified: Secondary | ICD-10-CM | POA: Diagnosis not present

## 2021-11-06 DIAGNOSIS — E785 Hyperlipidemia, unspecified: Secondary | ICD-10-CM | POA: Diagnosis not present

## 2021-11-06 DIAGNOSIS — R1310 Dysphagia, unspecified: Secondary | ICD-10-CM | POA: Diagnosis not present

## 2021-11-06 DIAGNOSIS — E039 Hypothyroidism, unspecified: Secondary | ICD-10-CM | POA: Diagnosis not present

## 2021-11-06 DIAGNOSIS — I10 Essential (primary) hypertension: Secondary | ICD-10-CM | POA: Diagnosis not present

## 2021-11-06 DIAGNOSIS — R443 Hallucinations, unspecified: Secondary | ICD-10-CM | POA: Diagnosis not present

## 2021-11-06 DIAGNOSIS — E559 Vitamin D deficiency, unspecified: Secondary | ICD-10-CM | POA: Diagnosis not present

## 2021-11-06 DIAGNOSIS — K219 Gastro-esophageal reflux disease without esophagitis: Secondary | ICD-10-CM | POA: Diagnosis not present

## 2021-11-06 DIAGNOSIS — D649 Anemia, unspecified: Secondary | ICD-10-CM | POA: Diagnosis not present

## 2021-11-06 DIAGNOSIS — N390 Urinary tract infection, site not specified: Secondary | ICD-10-CM | POA: Diagnosis not present

## 2021-11-06 DIAGNOSIS — K5900 Constipation, unspecified: Secondary | ICD-10-CM | POA: Diagnosis not present

## 2021-11-06 DIAGNOSIS — U071 COVID-19: Secondary | ICD-10-CM | POA: Diagnosis not present

## 2021-11-11 DIAGNOSIS — L89152 Pressure ulcer of sacral region, stage 2: Secondary | ICD-10-CM | POA: Diagnosis not present

## 2021-11-18 DIAGNOSIS — F29 Unspecified psychosis not due to a substance or known physiological condition: Secondary | ICD-10-CM | POA: Diagnosis not present

## 2021-11-18 DIAGNOSIS — F3112 Bipolar disorder, current episode manic without psychotic features, moderate: Secondary | ICD-10-CM | POA: Diagnosis not present

## 2021-11-18 DIAGNOSIS — L89152 Pressure ulcer of sacral region, stage 2: Secondary | ICD-10-CM | POA: Diagnosis not present

## 2021-11-18 DIAGNOSIS — G300 Alzheimer's disease with early onset: Secondary | ICD-10-CM | POA: Diagnosis not present

## 2021-11-19 DIAGNOSIS — K5900 Constipation, unspecified: Secondary | ICD-10-CM | POA: Diagnosis not present

## 2021-11-19 DIAGNOSIS — E559 Vitamin D deficiency, unspecified: Secondary | ICD-10-CM | POA: Diagnosis not present

## 2021-11-19 DIAGNOSIS — R443 Hallucinations, unspecified: Secondary | ICD-10-CM | POA: Diagnosis not present

## 2021-11-19 DIAGNOSIS — I10 Essential (primary) hypertension: Secondary | ICD-10-CM | POA: Diagnosis not present

## 2021-11-19 DIAGNOSIS — U071 COVID-19: Secondary | ICD-10-CM | POA: Diagnosis not present

## 2021-11-19 DIAGNOSIS — N390 Urinary tract infection, site not specified: Secondary | ICD-10-CM | POA: Diagnosis not present

## 2021-11-19 DIAGNOSIS — N189 Chronic kidney disease, unspecified: Secondary | ICD-10-CM | POA: Diagnosis not present

## 2021-11-19 DIAGNOSIS — E039 Hypothyroidism, unspecified: Secondary | ICD-10-CM | POA: Diagnosis not present

## 2021-11-19 DIAGNOSIS — D649 Anemia, unspecified: Secondary | ICD-10-CM | POA: Diagnosis not present

## 2021-11-19 DIAGNOSIS — K219 Gastro-esophageal reflux disease without esophagitis: Secondary | ICD-10-CM | POA: Diagnosis not present

## 2021-11-19 DIAGNOSIS — E785 Hyperlipidemia, unspecified: Secondary | ICD-10-CM | POA: Diagnosis not present

## 2021-11-22 DIAGNOSIS — E559 Vitamin D deficiency, unspecified: Secondary | ICD-10-CM | POA: Diagnosis not present

## 2021-11-22 DIAGNOSIS — E039 Hypothyroidism, unspecified: Secondary | ICD-10-CM | POA: Diagnosis not present

## 2021-11-22 DIAGNOSIS — D500 Iron deficiency anemia secondary to blood loss (chronic): Secondary | ICD-10-CM | POA: Diagnosis not present

## 2021-11-22 DIAGNOSIS — I10 Essential (primary) hypertension: Secondary | ICD-10-CM | POA: Diagnosis not present

## 2021-11-22 DIAGNOSIS — D649 Anemia, unspecified: Secondary | ICD-10-CM | POA: Diagnosis not present

## 2021-11-22 DIAGNOSIS — E785 Hyperlipidemia, unspecified: Secondary | ICD-10-CM | POA: Diagnosis not present

## 2021-11-22 DIAGNOSIS — F0390 Unspecified dementia without behavioral disturbance: Secondary | ICD-10-CM | POA: Diagnosis not present

## 2021-11-22 DIAGNOSIS — N189 Chronic kidney disease, unspecified: Secondary | ICD-10-CM | POA: Diagnosis not present

## 2021-11-25 DIAGNOSIS — L89152 Pressure ulcer of sacral region, stage 2: Secondary | ICD-10-CM | POA: Diagnosis not present

## 2021-12-02 DIAGNOSIS — L89152 Pressure ulcer of sacral region, stage 2: Secondary | ICD-10-CM | POA: Diagnosis not present

## 2021-12-04 DIAGNOSIS — E559 Vitamin D deficiency, unspecified: Secondary | ICD-10-CM | POA: Diagnosis not present

## 2021-12-04 DIAGNOSIS — N390 Urinary tract infection, site not specified: Secondary | ICD-10-CM | POA: Diagnosis not present

## 2021-12-04 DIAGNOSIS — F0390 Unspecified dementia without behavioral disturbance: Secondary | ICD-10-CM | POA: Diagnosis not present

## 2021-12-04 DIAGNOSIS — K219 Gastro-esophageal reflux disease without esophagitis: Secondary | ICD-10-CM | POA: Diagnosis not present

## 2021-12-04 DIAGNOSIS — E785 Hyperlipidemia, unspecified: Secondary | ICD-10-CM | POA: Diagnosis not present

## 2021-12-04 DIAGNOSIS — E039 Hypothyroidism, unspecified: Secondary | ICD-10-CM | POA: Diagnosis not present

## 2021-12-04 DIAGNOSIS — K5900 Constipation, unspecified: Secondary | ICD-10-CM | POA: Diagnosis not present

## 2021-12-04 DIAGNOSIS — U071 COVID-19: Secondary | ICD-10-CM | POA: Diagnosis not present

## 2021-12-04 DIAGNOSIS — D649 Anemia, unspecified: Secondary | ICD-10-CM | POA: Diagnosis not present

## 2021-12-09 DIAGNOSIS — L89152 Pressure ulcer of sacral region, stage 2: Secondary | ICD-10-CM | POA: Diagnosis not present

## 2021-12-16 DIAGNOSIS — L89152 Pressure ulcer of sacral region, stage 2: Secondary | ICD-10-CM | POA: Diagnosis not present

## 2021-12-16 DIAGNOSIS — F29 Unspecified psychosis not due to a substance or known physiological condition: Secondary | ICD-10-CM | POA: Diagnosis not present

## 2021-12-16 DIAGNOSIS — G300 Alzheimer's disease with early onset: Secondary | ICD-10-CM | POA: Diagnosis not present

## 2021-12-16 DIAGNOSIS — F3112 Bipolar disorder, current episode manic without psychotic features, moderate: Secondary | ICD-10-CM | POA: Diagnosis not present

## 2021-12-18 DIAGNOSIS — U071 COVID-19: Secondary | ICD-10-CM | POA: Diagnosis not present

## 2021-12-18 DIAGNOSIS — R062 Wheezing: Secondary | ICD-10-CM | POA: Diagnosis not present

## 2021-12-18 DIAGNOSIS — R059 Cough, unspecified: Secondary | ICD-10-CM | POA: Diagnosis not present

## 2021-12-19 DIAGNOSIS — R059 Cough, unspecified: Secondary | ICD-10-CM | POA: Diagnosis not present

## 2021-12-23 DIAGNOSIS — R1312 Dysphagia, oropharyngeal phase: Secondary | ICD-10-CM | POA: Diagnosis not present

## 2021-12-23 DIAGNOSIS — L89152 Pressure ulcer of sacral region, stage 2: Secondary | ICD-10-CM | POA: Diagnosis not present

## 2021-12-23 DIAGNOSIS — F0390 Unspecified dementia without behavioral disturbance: Secondary | ICD-10-CM | POA: Diagnosis not present

## 2021-12-23 DIAGNOSIS — R41841 Cognitive communication deficit: Secondary | ICD-10-CM | POA: Diagnosis not present

## 2021-12-24 DIAGNOSIS — F0390 Unspecified dementia without behavioral disturbance: Secondary | ICD-10-CM | POA: Diagnosis not present

## 2021-12-24 DIAGNOSIS — R1312 Dysphagia, oropharyngeal phase: Secondary | ICD-10-CM | POA: Diagnosis not present

## 2021-12-24 DIAGNOSIS — R41841 Cognitive communication deficit: Secondary | ICD-10-CM | POA: Diagnosis not present

## 2021-12-25 DIAGNOSIS — R1312 Dysphagia, oropharyngeal phase: Secondary | ICD-10-CM | POA: Diagnosis not present

## 2021-12-25 DIAGNOSIS — F0390 Unspecified dementia without behavioral disturbance: Secondary | ICD-10-CM | POA: Diagnosis not present

## 2021-12-25 DIAGNOSIS — R41841 Cognitive communication deficit: Secondary | ICD-10-CM | POA: Diagnosis not present

## 2021-12-26 DIAGNOSIS — R1312 Dysphagia, oropharyngeal phase: Secondary | ICD-10-CM | POA: Diagnosis not present

## 2021-12-26 DIAGNOSIS — F0390 Unspecified dementia without behavioral disturbance: Secondary | ICD-10-CM | POA: Diagnosis not present

## 2021-12-26 DIAGNOSIS — R41841 Cognitive communication deficit: Secondary | ICD-10-CM | POA: Diagnosis not present

## 2021-12-29 DIAGNOSIS — F0390 Unspecified dementia without behavioral disturbance: Secondary | ICD-10-CM | POA: Diagnosis not present

## 2021-12-29 DIAGNOSIS — R1312 Dysphagia, oropharyngeal phase: Secondary | ICD-10-CM | POA: Diagnosis not present

## 2021-12-29 DIAGNOSIS — R41841 Cognitive communication deficit: Secondary | ICD-10-CM | POA: Diagnosis not present

## 2021-12-30 DIAGNOSIS — R41841 Cognitive communication deficit: Secondary | ICD-10-CM | POA: Diagnosis not present

## 2021-12-30 DIAGNOSIS — F0390 Unspecified dementia without behavioral disturbance: Secondary | ICD-10-CM | POA: Diagnosis not present

## 2021-12-30 DIAGNOSIS — R1312 Dysphagia, oropharyngeal phase: Secondary | ICD-10-CM | POA: Diagnosis not present

## 2021-12-30 DIAGNOSIS — L89152 Pressure ulcer of sacral region, stage 2: Secondary | ICD-10-CM | POA: Diagnosis not present

## 2021-12-31 DIAGNOSIS — E559 Vitamin D deficiency, unspecified: Secondary | ICD-10-CM | POA: Diagnosis not present

## 2021-12-31 DIAGNOSIS — K219 Gastro-esophageal reflux disease without esophagitis: Secondary | ICD-10-CM | POA: Diagnosis not present

## 2021-12-31 DIAGNOSIS — E785 Hyperlipidemia, unspecified: Secondary | ICD-10-CM | POA: Diagnosis not present

## 2021-12-31 DIAGNOSIS — F419 Anxiety disorder, unspecified: Secondary | ICD-10-CM | POA: Diagnosis not present

## 2021-12-31 DIAGNOSIS — H6122 Impacted cerumen, left ear: Secondary | ICD-10-CM | POA: Diagnosis not present

## 2021-12-31 DIAGNOSIS — E039 Hypothyroidism, unspecified: Secondary | ICD-10-CM | POA: Diagnosis not present

## 2021-12-31 DIAGNOSIS — R1312 Dysphagia, oropharyngeal phase: Secondary | ICD-10-CM | POA: Diagnosis not present

## 2021-12-31 DIAGNOSIS — U071 COVID-19: Secondary | ICD-10-CM | POA: Diagnosis not present

## 2021-12-31 DIAGNOSIS — K5900 Constipation, unspecified: Secondary | ICD-10-CM | POA: Diagnosis not present

## 2021-12-31 DIAGNOSIS — D649 Anemia, unspecified: Secondary | ICD-10-CM | POA: Diagnosis not present

## 2021-12-31 DIAGNOSIS — N390 Urinary tract infection, site not specified: Secondary | ICD-10-CM | POA: Diagnosis not present

## 2021-12-31 DIAGNOSIS — R41841 Cognitive communication deficit: Secondary | ICD-10-CM | POA: Diagnosis not present

## 2021-12-31 DIAGNOSIS — F0390 Unspecified dementia without behavioral disturbance: Secondary | ICD-10-CM | POA: Diagnosis not present

## 2022-01-01 DIAGNOSIS — R41841 Cognitive communication deficit: Secondary | ICD-10-CM | POA: Diagnosis not present

## 2022-01-01 DIAGNOSIS — F0390 Unspecified dementia without behavioral disturbance: Secondary | ICD-10-CM | POA: Diagnosis not present

## 2022-01-01 DIAGNOSIS — R1312 Dysphagia, oropharyngeal phase: Secondary | ICD-10-CM | POA: Diagnosis not present

## 2022-01-02 DIAGNOSIS — F0390 Unspecified dementia without behavioral disturbance: Secondary | ICD-10-CM | POA: Diagnosis not present

## 2022-01-02 DIAGNOSIS — R41841 Cognitive communication deficit: Secondary | ICD-10-CM | POA: Diagnosis not present

## 2022-01-02 DIAGNOSIS — R1312 Dysphagia, oropharyngeal phase: Secondary | ICD-10-CM | POA: Diagnosis not present

## 2022-01-04 DIAGNOSIS — K219 Gastro-esophageal reflux disease without esophagitis: Secondary | ICD-10-CM | POA: Diagnosis not present

## 2022-01-04 DIAGNOSIS — F0280 Dementia in other diseases classified elsewhere without behavioral disturbance: Secondary | ICD-10-CM | POA: Diagnosis not present

## 2022-01-04 DIAGNOSIS — G309 Alzheimer's disease, unspecified: Secondary | ICD-10-CM | POA: Diagnosis not present

## 2022-01-04 DIAGNOSIS — R1310 Dysphagia, unspecified: Secondary | ICD-10-CM | POA: Diagnosis not present

## 2022-01-04 DIAGNOSIS — I10 Essential (primary) hypertension: Secondary | ICD-10-CM | POA: Diagnosis not present

## 2022-01-04 DIAGNOSIS — E785 Hyperlipidemia, unspecified: Secondary | ICD-10-CM | POA: Diagnosis not present

## 2022-01-04 DIAGNOSIS — E039 Hypothyroidism, unspecified: Secondary | ICD-10-CM | POA: Diagnosis not present

## 2022-01-06 DIAGNOSIS — R1310 Dysphagia, unspecified: Secondary | ICD-10-CM | POA: Diagnosis not present

## 2022-01-06 DIAGNOSIS — E039 Hypothyroidism, unspecified: Secondary | ICD-10-CM | POA: Diagnosis not present

## 2022-01-06 DIAGNOSIS — E785 Hyperlipidemia, unspecified: Secondary | ICD-10-CM | POA: Diagnosis not present

## 2022-01-06 DIAGNOSIS — F0280 Dementia in other diseases classified elsewhere without behavioral disturbance: Secondary | ICD-10-CM | POA: Diagnosis not present

## 2022-01-06 DIAGNOSIS — I10 Essential (primary) hypertension: Secondary | ICD-10-CM | POA: Diagnosis not present

## 2022-01-06 DIAGNOSIS — G309 Alzheimer's disease, unspecified: Secondary | ICD-10-CM | POA: Diagnosis not present

## 2022-01-07 DIAGNOSIS — H109 Unspecified conjunctivitis: Secondary | ICD-10-CM | POA: Diagnosis not present

## 2022-01-07 DIAGNOSIS — R443 Hallucinations, unspecified: Secondary | ICD-10-CM | POA: Diagnosis not present

## 2022-01-07 DIAGNOSIS — K5900 Constipation, unspecified: Secondary | ICD-10-CM | POA: Diagnosis not present

## 2022-01-07 DIAGNOSIS — R0682 Tachypnea, not elsewhere classified: Secondary | ICD-10-CM | POA: Diagnosis not present

## 2022-01-07 DIAGNOSIS — N189 Chronic kidney disease, unspecified: Secondary | ICD-10-CM | POA: Diagnosis not present

## 2022-01-07 DIAGNOSIS — K219 Gastro-esophageal reflux disease without esophagitis: Secondary | ICD-10-CM | POA: Diagnosis not present

## 2022-01-07 DIAGNOSIS — D649 Anemia, unspecified: Secondary | ICD-10-CM | POA: Diagnosis not present

## 2022-01-07 DIAGNOSIS — E039 Hypothyroidism, unspecified: Secondary | ICD-10-CM | POA: Diagnosis not present

## 2022-01-07 DIAGNOSIS — R1310 Dysphagia, unspecified: Secondary | ICD-10-CM | POA: Diagnosis not present

## 2022-01-07 DIAGNOSIS — F32A Depression, unspecified: Secondary | ICD-10-CM | POA: Diagnosis not present

## 2022-01-09 DIAGNOSIS — R1310 Dysphagia, unspecified: Secondary | ICD-10-CM | POA: Diagnosis not present

## 2022-01-09 DIAGNOSIS — E039 Hypothyroidism, unspecified: Secondary | ICD-10-CM | POA: Diagnosis not present

## 2022-01-09 DIAGNOSIS — E785 Hyperlipidemia, unspecified: Secondary | ICD-10-CM | POA: Diagnosis not present

## 2022-01-09 DIAGNOSIS — F0280 Dementia in other diseases classified elsewhere without behavioral disturbance: Secondary | ICD-10-CM | POA: Diagnosis not present

## 2022-01-09 DIAGNOSIS — I10 Essential (primary) hypertension: Secondary | ICD-10-CM | POA: Diagnosis not present

## 2022-01-09 DIAGNOSIS — G309 Alzheimer's disease, unspecified: Secondary | ICD-10-CM | POA: Diagnosis not present

## 2022-01-10 DIAGNOSIS — I10 Essential (primary) hypertension: Secondary | ICD-10-CM | POA: Diagnosis not present

## 2022-01-10 DIAGNOSIS — E559 Vitamin D deficiency, unspecified: Secondary | ICD-10-CM | POA: Diagnosis not present

## 2022-01-10 DIAGNOSIS — N189 Chronic kidney disease, unspecified: Secondary | ICD-10-CM | POA: Diagnosis not present

## 2022-01-10 DIAGNOSIS — D500 Iron deficiency anemia secondary to blood loss (chronic): Secondary | ICD-10-CM | POA: Diagnosis not present

## 2022-01-10 DIAGNOSIS — E039 Hypothyroidism, unspecified: Secondary | ICD-10-CM | POA: Diagnosis not present

## 2022-01-10 DIAGNOSIS — E785 Hyperlipidemia, unspecified: Secondary | ICD-10-CM | POA: Diagnosis not present

## 2022-01-13 DIAGNOSIS — E785 Hyperlipidemia, unspecified: Secondary | ICD-10-CM | POA: Diagnosis not present

## 2022-01-13 DIAGNOSIS — I10 Essential (primary) hypertension: Secondary | ICD-10-CM | POA: Diagnosis not present

## 2022-01-13 DIAGNOSIS — E039 Hypothyroidism, unspecified: Secondary | ICD-10-CM | POA: Diagnosis not present

## 2022-01-13 DIAGNOSIS — F0280 Dementia in other diseases classified elsewhere without behavioral disturbance: Secondary | ICD-10-CM | POA: Diagnosis not present

## 2022-01-13 DIAGNOSIS — G309 Alzheimer's disease, unspecified: Secondary | ICD-10-CM | POA: Diagnosis not present

## 2022-01-13 DIAGNOSIS — R1310 Dysphagia, unspecified: Secondary | ICD-10-CM | POA: Diagnosis not present

## 2022-01-15 DIAGNOSIS — K5900 Constipation, unspecified: Secondary | ICD-10-CM | POA: Diagnosis not present

## 2022-01-15 DIAGNOSIS — G309 Alzheimer's disease, unspecified: Secondary | ICD-10-CM | POA: Diagnosis not present

## 2022-01-15 DIAGNOSIS — E785 Hyperlipidemia, unspecified: Secondary | ICD-10-CM | POA: Diagnosis not present

## 2022-01-15 DIAGNOSIS — H109 Unspecified conjunctivitis: Secondary | ICD-10-CM | POA: Diagnosis not present

## 2022-01-15 DIAGNOSIS — E039 Hypothyroidism, unspecified: Secondary | ICD-10-CM | POA: Diagnosis not present

## 2022-01-15 DIAGNOSIS — N189 Chronic kidney disease, unspecified: Secondary | ICD-10-CM | POA: Diagnosis not present

## 2022-01-15 DIAGNOSIS — R0682 Tachypnea, not elsewhere classified: Secondary | ICD-10-CM | POA: Diagnosis not present

## 2022-01-15 DIAGNOSIS — F0280 Dementia in other diseases classified elsewhere without behavioral disturbance: Secondary | ICD-10-CM | POA: Diagnosis not present

## 2022-01-15 DIAGNOSIS — I10 Essential (primary) hypertension: Secondary | ICD-10-CM | POA: Diagnosis not present

## 2022-01-15 DIAGNOSIS — D649 Anemia, unspecified: Secondary | ICD-10-CM | POA: Diagnosis not present

## 2022-01-15 DIAGNOSIS — R443 Hallucinations, unspecified: Secondary | ICD-10-CM | POA: Diagnosis not present

## 2022-01-15 DIAGNOSIS — K219 Gastro-esophageal reflux disease without esophagitis: Secondary | ICD-10-CM | POA: Diagnosis not present

## 2022-01-15 DIAGNOSIS — R1310 Dysphagia, unspecified: Secondary | ICD-10-CM | POA: Diagnosis not present

## 2022-01-16 DIAGNOSIS — E039 Hypothyroidism, unspecified: Secondary | ICD-10-CM | POA: Diagnosis not present

## 2022-01-16 DIAGNOSIS — E785 Hyperlipidemia, unspecified: Secondary | ICD-10-CM | POA: Diagnosis not present

## 2022-01-16 DIAGNOSIS — R1310 Dysphagia, unspecified: Secondary | ICD-10-CM | POA: Diagnosis not present

## 2022-01-16 DIAGNOSIS — F0280 Dementia in other diseases classified elsewhere without behavioral disturbance: Secondary | ICD-10-CM | POA: Diagnosis not present

## 2022-01-16 DIAGNOSIS — G309 Alzheimer's disease, unspecified: Secondary | ICD-10-CM | POA: Diagnosis not present

## 2022-01-16 DIAGNOSIS — I10 Essential (primary) hypertension: Secondary | ICD-10-CM | POA: Diagnosis not present

## 2022-01-20 DIAGNOSIS — R1310 Dysphagia, unspecified: Secondary | ICD-10-CM | POA: Diagnosis not present

## 2022-01-20 DIAGNOSIS — F0280 Dementia in other diseases classified elsewhere without behavioral disturbance: Secondary | ICD-10-CM | POA: Diagnosis not present

## 2022-01-20 DIAGNOSIS — E785 Hyperlipidemia, unspecified: Secondary | ICD-10-CM | POA: Diagnosis not present

## 2022-01-20 DIAGNOSIS — E039 Hypothyroidism, unspecified: Secondary | ICD-10-CM | POA: Diagnosis not present

## 2022-01-20 DIAGNOSIS — G309 Alzheimer's disease, unspecified: Secondary | ICD-10-CM | POA: Diagnosis not present

## 2022-01-20 DIAGNOSIS — I10 Essential (primary) hypertension: Secondary | ICD-10-CM | POA: Diagnosis not present

## 2022-01-21 DIAGNOSIS — R1310 Dysphagia, unspecified: Secondary | ICD-10-CM | POA: Diagnosis not present

## 2022-01-21 DIAGNOSIS — F3112 Bipolar disorder, current episode manic without psychotic features, moderate: Secondary | ICD-10-CM | POA: Diagnosis not present

## 2022-01-21 DIAGNOSIS — G300 Alzheimer's disease with early onset: Secondary | ICD-10-CM | POA: Diagnosis not present

## 2022-01-21 DIAGNOSIS — I10 Essential (primary) hypertension: Secondary | ICD-10-CM | POA: Diagnosis not present

## 2022-01-21 DIAGNOSIS — F29 Unspecified psychosis not due to a substance or known physiological condition: Secondary | ICD-10-CM | POA: Diagnosis not present

## 2022-01-21 DIAGNOSIS — E039 Hypothyroidism, unspecified: Secondary | ICD-10-CM | POA: Diagnosis not present

## 2022-01-21 DIAGNOSIS — G309 Alzheimer's disease, unspecified: Secondary | ICD-10-CM | POA: Diagnosis not present

## 2022-01-21 DIAGNOSIS — E785 Hyperlipidemia, unspecified: Secondary | ICD-10-CM | POA: Diagnosis not present

## 2022-01-21 DIAGNOSIS — K219 Gastro-esophageal reflux disease without esophagitis: Secondary | ICD-10-CM | POA: Diagnosis not present

## 2022-01-21 DIAGNOSIS — F0280 Dementia in other diseases classified elsewhere without behavioral disturbance: Secondary | ICD-10-CM | POA: Diagnosis not present

## 2022-01-23 DIAGNOSIS — G309 Alzheimer's disease, unspecified: Secondary | ICD-10-CM | POA: Diagnosis not present

## 2022-01-23 DIAGNOSIS — I10 Essential (primary) hypertension: Secondary | ICD-10-CM | POA: Diagnosis not present

## 2022-01-23 DIAGNOSIS — F0280 Dementia in other diseases classified elsewhere without behavioral disturbance: Secondary | ICD-10-CM | POA: Diagnosis not present

## 2022-01-23 DIAGNOSIS — E785 Hyperlipidemia, unspecified: Secondary | ICD-10-CM | POA: Diagnosis not present

## 2022-01-23 DIAGNOSIS — R1310 Dysphagia, unspecified: Secondary | ICD-10-CM | POA: Diagnosis not present

## 2022-01-23 DIAGNOSIS — D649 Anemia, unspecified: Secondary | ICD-10-CM | POA: Diagnosis not present

## 2022-01-23 DIAGNOSIS — G300 Alzheimer's disease with early onset: Secondary | ICD-10-CM | POA: Diagnosis not present

## 2022-01-23 DIAGNOSIS — E039 Hypothyroidism, unspecified: Secondary | ICD-10-CM | POA: Diagnosis not present

## 2022-01-23 DIAGNOSIS — I120 Hypertensive chronic kidney disease with stage 5 chronic kidney disease or end stage renal disease: Secondary | ICD-10-CM | POA: Diagnosis not present

## 2022-01-24 DIAGNOSIS — E785 Hyperlipidemia, unspecified: Secondary | ICD-10-CM | POA: Diagnosis not present

## 2022-01-24 DIAGNOSIS — F0390 Unspecified dementia without behavioral disturbance: Secondary | ICD-10-CM | POA: Diagnosis not present

## 2022-01-24 DIAGNOSIS — R899 Unspecified abnormal finding in specimens from other organs, systems and tissues: Secondary | ICD-10-CM | POA: Diagnosis not present

## 2022-01-24 DIAGNOSIS — E559 Vitamin D deficiency, unspecified: Secondary | ICD-10-CM | POA: Diagnosis not present

## 2022-01-24 DIAGNOSIS — E039 Hypothyroidism, unspecified: Secondary | ICD-10-CM | POA: Diagnosis not present

## 2022-01-24 DIAGNOSIS — N189 Chronic kidney disease, unspecified: Secondary | ICD-10-CM | POA: Diagnosis not present

## 2022-01-24 DIAGNOSIS — D500 Iron deficiency anemia secondary to blood loss (chronic): Secondary | ICD-10-CM | POA: Diagnosis not present

## 2022-01-24 DIAGNOSIS — I10 Essential (primary) hypertension: Secondary | ICD-10-CM | POA: Diagnosis not present

## 2022-01-26 DIAGNOSIS — E785 Hyperlipidemia, unspecified: Secondary | ICD-10-CM | POA: Diagnosis not present

## 2022-01-26 DIAGNOSIS — R1310 Dysphagia, unspecified: Secondary | ICD-10-CM | POA: Diagnosis not present

## 2022-01-26 DIAGNOSIS — G309 Alzheimer's disease, unspecified: Secondary | ICD-10-CM | POA: Diagnosis not present

## 2022-01-26 DIAGNOSIS — E039 Hypothyroidism, unspecified: Secondary | ICD-10-CM | POA: Diagnosis not present

## 2022-01-26 DIAGNOSIS — I10 Essential (primary) hypertension: Secondary | ICD-10-CM | POA: Diagnosis not present

## 2022-01-26 DIAGNOSIS — F0280 Dementia in other diseases classified elsewhere without behavioral disturbance: Secondary | ICD-10-CM | POA: Diagnosis not present

## 2022-01-27 DIAGNOSIS — G309 Alzheimer's disease, unspecified: Secondary | ICD-10-CM | POA: Diagnosis not present

## 2022-01-27 DIAGNOSIS — E039 Hypothyroidism, unspecified: Secondary | ICD-10-CM | POA: Diagnosis not present

## 2022-01-27 DIAGNOSIS — R1310 Dysphagia, unspecified: Secondary | ICD-10-CM | POA: Diagnosis not present

## 2022-01-27 DIAGNOSIS — E785 Hyperlipidemia, unspecified: Secondary | ICD-10-CM | POA: Diagnosis not present

## 2022-01-27 DIAGNOSIS — I10 Essential (primary) hypertension: Secondary | ICD-10-CM | POA: Diagnosis not present

## 2022-01-27 DIAGNOSIS — F0280 Dementia in other diseases classified elsewhere without behavioral disturbance: Secondary | ICD-10-CM | POA: Diagnosis not present

## 2022-01-30 DIAGNOSIS — R1310 Dysphagia, unspecified: Secondary | ICD-10-CM | POA: Diagnosis not present

## 2022-01-30 DIAGNOSIS — E039 Hypothyroidism, unspecified: Secondary | ICD-10-CM | POA: Diagnosis not present

## 2022-01-30 DIAGNOSIS — G309 Alzheimer's disease, unspecified: Secondary | ICD-10-CM | POA: Diagnosis not present

## 2022-01-30 DIAGNOSIS — I10 Essential (primary) hypertension: Secondary | ICD-10-CM | POA: Diagnosis not present

## 2022-01-30 DIAGNOSIS — F0280 Dementia in other diseases classified elsewhere without behavioral disturbance: Secondary | ICD-10-CM | POA: Diagnosis not present

## 2022-01-30 DIAGNOSIS — E785 Hyperlipidemia, unspecified: Secondary | ICD-10-CM | POA: Diagnosis not present

## 2022-02-02 DIAGNOSIS — R1310 Dysphagia, unspecified: Secondary | ICD-10-CM | POA: Diagnosis not present

## 2022-02-02 DIAGNOSIS — G309 Alzheimer's disease, unspecified: Secondary | ICD-10-CM | POA: Diagnosis not present

## 2022-02-02 DIAGNOSIS — E785 Hyperlipidemia, unspecified: Secondary | ICD-10-CM | POA: Diagnosis not present

## 2022-02-02 DIAGNOSIS — E039 Hypothyroidism, unspecified: Secondary | ICD-10-CM | POA: Diagnosis not present

## 2022-02-02 DIAGNOSIS — F0280 Dementia in other diseases classified elsewhere without behavioral disturbance: Secondary | ICD-10-CM | POA: Diagnosis not present

## 2022-02-02 DIAGNOSIS — I10 Essential (primary) hypertension: Secondary | ICD-10-CM | POA: Diagnosis not present

## 2022-02-03 DIAGNOSIS — G309 Alzheimer's disease, unspecified: Secondary | ICD-10-CM | POA: Diagnosis not present

## 2022-02-03 DIAGNOSIS — E039 Hypothyroidism, unspecified: Secondary | ICD-10-CM | POA: Diagnosis not present

## 2022-02-03 DIAGNOSIS — R1310 Dysphagia, unspecified: Secondary | ICD-10-CM | POA: Diagnosis not present

## 2022-02-03 DIAGNOSIS — E785 Hyperlipidemia, unspecified: Secondary | ICD-10-CM | POA: Diagnosis not present

## 2022-02-03 DIAGNOSIS — I10 Essential (primary) hypertension: Secondary | ICD-10-CM | POA: Diagnosis not present

## 2022-02-03 DIAGNOSIS — F0280 Dementia in other diseases classified elsewhere without behavioral disturbance: Secondary | ICD-10-CM | POA: Diagnosis not present

## 2022-02-04 DIAGNOSIS — R1310 Dysphagia, unspecified: Secondary | ICD-10-CM | POA: Diagnosis not present

## 2022-02-04 DIAGNOSIS — N189 Chronic kidney disease, unspecified: Secondary | ICD-10-CM | POA: Diagnosis not present

## 2022-02-04 DIAGNOSIS — D649 Anemia, unspecified: Secondary | ICD-10-CM | POA: Diagnosis not present

## 2022-02-04 DIAGNOSIS — I10 Essential (primary) hypertension: Secondary | ICD-10-CM | POA: Diagnosis not present

## 2022-02-04 DIAGNOSIS — K219 Gastro-esophageal reflux disease without esophagitis: Secondary | ICD-10-CM | POA: Diagnosis not present

## 2022-02-04 DIAGNOSIS — E039 Hypothyroidism, unspecified: Secondary | ICD-10-CM | POA: Diagnosis not present

## 2022-02-04 DIAGNOSIS — F0390 Unspecified dementia without behavioral disturbance: Secondary | ICD-10-CM | POA: Diagnosis not present

## 2022-02-04 DIAGNOSIS — R443 Hallucinations, unspecified: Secondary | ICD-10-CM | POA: Diagnosis not present

## 2022-02-04 DIAGNOSIS — K5900 Constipation, unspecified: Secondary | ICD-10-CM | POA: Diagnosis not present

## 2022-02-04 DIAGNOSIS — J9690 Respiratory failure, unspecified, unspecified whether with hypoxia or hypercapnia: Secondary | ICD-10-CM | POA: Diagnosis not present

## 2022-02-06 DIAGNOSIS — F0280 Dementia in other diseases classified elsewhere without behavioral disturbance: Secondary | ICD-10-CM | POA: Diagnosis not present

## 2022-02-06 DIAGNOSIS — I10 Essential (primary) hypertension: Secondary | ICD-10-CM | POA: Diagnosis not present

## 2022-02-06 DIAGNOSIS — E039 Hypothyroidism, unspecified: Secondary | ICD-10-CM | POA: Diagnosis not present

## 2022-02-06 DIAGNOSIS — E785 Hyperlipidemia, unspecified: Secondary | ICD-10-CM | POA: Diagnosis not present

## 2022-02-06 DIAGNOSIS — R1310 Dysphagia, unspecified: Secondary | ICD-10-CM | POA: Diagnosis not present

## 2022-02-06 DIAGNOSIS — G309 Alzheimer's disease, unspecified: Secondary | ICD-10-CM | POA: Diagnosis not present

## 2022-02-10 DIAGNOSIS — E785 Hyperlipidemia, unspecified: Secondary | ICD-10-CM | POA: Diagnosis not present

## 2022-02-10 DIAGNOSIS — I10 Essential (primary) hypertension: Secondary | ICD-10-CM | POA: Diagnosis not present

## 2022-02-10 DIAGNOSIS — G309 Alzheimer's disease, unspecified: Secondary | ICD-10-CM | POA: Diagnosis not present

## 2022-02-10 DIAGNOSIS — R1310 Dysphagia, unspecified: Secondary | ICD-10-CM | POA: Diagnosis not present

## 2022-02-10 DIAGNOSIS — E039 Hypothyroidism, unspecified: Secondary | ICD-10-CM | POA: Diagnosis not present

## 2022-02-10 DIAGNOSIS — F0280 Dementia in other diseases classified elsewhere without behavioral disturbance: Secondary | ICD-10-CM | POA: Diagnosis not present

## 2022-02-12 DIAGNOSIS — R443 Hallucinations, unspecified: Secondary | ICD-10-CM | POA: Diagnosis not present

## 2022-02-12 DIAGNOSIS — K449 Diaphragmatic hernia without obstruction or gangrene: Secondary | ICD-10-CM | POA: Diagnosis not present

## 2022-02-12 DIAGNOSIS — I10 Essential (primary) hypertension: Secondary | ICD-10-CM | POA: Diagnosis not present

## 2022-02-12 DIAGNOSIS — R1310 Dysphagia, unspecified: Secondary | ICD-10-CM | POA: Diagnosis not present

## 2022-02-12 DIAGNOSIS — D649 Anemia, unspecified: Secondary | ICD-10-CM | POA: Diagnosis not present

## 2022-02-12 DIAGNOSIS — G309 Alzheimer's disease, unspecified: Secondary | ICD-10-CM | POA: Diagnosis not present

## 2022-02-12 DIAGNOSIS — E039 Hypothyroidism, unspecified: Secondary | ICD-10-CM | POA: Diagnosis not present

## 2022-02-12 DIAGNOSIS — F0280 Dementia in other diseases classified elsewhere without behavioral disturbance: Secondary | ICD-10-CM | POA: Diagnosis not present

## 2022-02-12 DIAGNOSIS — E785 Hyperlipidemia, unspecified: Secondary | ICD-10-CM | POA: Diagnosis not present

## 2022-02-12 DIAGNOSIS — N189 Chronic kidney disease, unspecified: Secondary | ICD-10-CM | POA: Diagnosis not present

## 2022-02-12 DIAGNOSIS — K219 Gastro-esophageal reflux disease without esophagitis: Secondary | ICD-10-CM | POA: Diagnosis not present

## 2022-02-13 DIAGNOSIS — R1310 Dysphagia, unspecified: Secondary | ICD-10-CM | POA: Diagnosis not present

## 2022-02-13 DIAGNOSIS — E785 Hyperlipidemia, unspecified: Secondary | ICD-10-CM | POA: Diagnosis not present

## 2022-02-13 DIAGNOSIS — I10 Essential (primary) hypertension: Secondary | ICD-10-CM | POA: Diagnosis not present

## 2022-02-13 DIAGNOSIS — E039 Hypothyroidism, unspecified: Secondary | ICD-10-CM | POA: Diagnosis not present

## 2022-02-13 DIAGNOSIS — F0280 Dementia in other diseases classified elsewhere without behavioral disturbance: Secondary | ICD-10-CM | POA: Diagnosis not present

## 2022-02-13 DIAGNOSIS — G309 Alzheimer's disease, unspecified: Secondary | ICD-10-CM | POA: Diagnosis not present

## 2022-02-16 DIAGNOSIS — E039 Hypothyroidism, unspecified: Secondary | ICD-10-CM | POA: Diagnosis not present

## 2022-02-16 DIAGNOSIS — G309 Alzheimer's disease, unspecified: Secondary | ICD-10-CM | POA: Diagnosis not present

## 2022-02-16 DIAGNOSIS — R1310 Dysphagia, unspecified: Secondary | ICD-10-CM | POA: Diagnosis not present

## 2022-02-16 DIAGNOSIS — E785 Hyperlipidemia, unspecified: Secondary | ICD-10-CM | POA: Diagnosis not present

## 2022-02-16 DIAGNOSIS — F0280 Dementia in other diseases classified elsewhere without behavioral disturbance: Secondary | ICD-10-CM | POA: Diagnosis not present

## 2022-02-16 DIAGNOSIS — I10 Essential (primary) hypertension: Secondary | ICD-10-CM | POA: Diagnosis not present

## 2022-02-17 DIAGNOSIS — E039 Hypothyroidism, unspecified: Secondary | ICD-10-CM | POA: Diagnosis not present

## 2022-02-17 DIAGNOSIS — E785 Hyperlipidemia, unspecified: Secondary | ICD-10-CM | POA: Diagnosis not present

## 2022-02-17 DIAGNOSIS — I10 Essential (primary) hypertension: Secondary | ICD-10-CM | POA: Diagnosis not present

## 2022-02-17 DIAGNOSIS — R1310 Dysphagia, unspecified: Secondary | ICD-10-CM | POA: Diagnosis not present

## 2022-02-17 DIAGNOSIS — G309 Alzheimer's disease, unspecified: Secondary | ICD-10-CM | POA: Diagnosis not present

## 2022-02-17 DIAGNOSIS — F0280 Dementia in other diseases classified elsewhere without behavioral disturbance: Secondary | ICD-10-CM | POA: Diagnosis not present

## 2022-02-18 DIAGNOSIS — G309 Alzheimer's disease, unspecified: Secondary | ICD-10-CM | POA: Diagnosis not present

## 2022-02-18 DIAGNOSIS — K219 Gastro-esophageal reflux disease without esophagitis: Secondary | ICD-10-CM | POA: Diagnosis not present

## 2022-02-18 DIAGNOSIS — F0280 Dementia in other diseases classified elsewhere without behavioral disturbance: Secondary | ICD-10-CM | POA: Diagnosis not present

## 2022-02-18 DIAGNOSIS — I10 Essential (primary) hypertension: Secondary | ICD-10-CM | POA: Diagnosis not present

## 2022-02-18 DIAGNOSIS — R1310 Dysphagia, unspecified: Secondary | ICD-10-CM | POA: Diagnosis not present

## 2022-02-18 DIAGNOSIS — E785 Hyperlipidemia, unspecified: Secondary | ICD-10-CM | POA: Diagnosis not present

## 2022-02-18 DIAGNOSIS — E039 Hypothyroidism, unspecified: Secondary | ICD-10-CM | POA: Diagnosis not present

## 2022-02-20 DIAGNOSIS — F0280 Dementia in other diseases classified elsewhere without behavioral disturbance: Secondary | ICD-10-CM | POA: Diagnosis not present

## 2022-02-20 DIAGNOSIS — E039 Hypothyroidism, unspecified: Secondary | ICD-10-CM | POA: Diagnosis not present

## 2022-02-20 DIAGNOSIS — G309 Alzheimer's disease, unspecified: Secondary | ICD-10-CM | POA: Diagnosis not present

## 2022-02-20 DIAGNOSIS — R1310 Dysphagia, unspecified: Secondary | ICD-10-CM | POA: Diagnosis not present

## 2022-02-20 DIAGNOSIS — E785 Hyperlipidemia, unspecified: Secondary | ICD-10-CM | POA: Diagnosis not present

## 2022-02-20 DIAGNOSIS — I10 Essential (primary) hypertension: Secondary | ICD-10-CM | POA: Diagnosis not present

## 2022-02-21 DIAGNOSIS — E785 Hyperlipidemia, unspecified: Secondary | ICD-10-CM | POA: Diagnosis not present

## 2022-02-21 DIAGNOSIS — I10 Essential (primary) hypertension: Secondary | ICD-10-CM | POA: Diagnosis not present

## 2022-02-21 DIAGNOSIS — E559 Vitamin D deficiency, unspecified: Secondary | ICD-10-CM | POA: Diagnosis not present

## 2022-02-21 DIAGNOSIS — E039 Hypothyroidism, unspecified: Secondary | ICD-10-CM | POA: Diagnosis not present

## 2022-02-21 DIAGNOSIS — D500 Iron deficiency anemia secondary to blood loss (chronic): Secondary | ICD-10-CM | POA: Diagnosis not present

## 2022-02-21 DIAGNOSIS — N189 Chronic kidney disease, unspecified: Secondary | ICD-10-CM | POA: Diagnosis not present

## 2022-02-24 DIAGNOSIS — R1310 Dysphagia, unspecified: Secondary | ICD-10-CM | POA: Diagnosis not present

## 2022-02-24 DIAGNOSIS — E039 Hypothyroidism, unspecified: Secondary | ICD-10-CM | POA: Diagnosis not present

## 2022-02-24 DIAGNOSIS — G309 Alzheimer's disease, unspecified: Secondary | ICD-10-CM | POA: Diagnosis not present

## 2022-02-24 DIAGNOSIS — F0280 Dementia in other diseases classified elsewhere without behavioral disturbance: Secondary | ICD-10-CM | POA: Diagnosis not present

## 2022-02-24 DIAGNOSIS — E785 Hyperlipidemia, unspecified: Secondary | ICD-10-CM | POA: Diagnosis not present

## 2022-02-24 DIAGNOSIS — I10 Essential (primary) hypertension: Secondary | ICD-10-CM | POA: Diagnosis not present

## 2022-02-26 DIAGNOSIS — E039 Hypothyroidism, unspecified: Secondary | ICD-10-CM | POA: Diagnosis not present

## 2022-02-26 DIAGNOSIS — G309 Alzheimer's disease, unspecified: Secondary | ICD-10-CM | POA: Diagnosis not present

## 2022-02-26 DIAGNOSIS — F0280 Dementia in other diseases classified elsewhere without behavioral disturbance: Secondary | ICD-10-CM | POA: Diagnosis not present

## 2022-02-26 DIAGNOSIS — R1310 Dysphagia, unspecified: Secondary | ICD-10-CM | POA: Diagnosis not present

## 2022-02-26 DIAGNOSIS — E785 Hyperlipidemia, unspecified: Secondary | ICD-10-CM | POA: Diagnosis not present

## 2022-02-26 DIAGNOSIS — I10 Essential (primary) hypertension: Secondary | ICD-10-CM | POA: Diagnosis not present

## 2022-02-27 DIAGNOSIS — R1310 Dysphagia, unspecified: Secondary | ICD-10-CM | POA: Diagnosis not present

## 2022-02-27 DIAGNOSIS — E785 Hyperlipidemia, unspecified: Secondary | ICD-10-CM | POA: Diagnosis not present

## 2022-02-27 DIAGNOSIS — G309 Alzheimer's disease, unspecified: Secondary | ICD-10-CM | POA: Diagnosis not present

## 2022-02-27 DIAGNOSIS — I10 Essential (primary) hypertension: Secondary | ICD-10-CM | POA: Diagnosis not present

## 2022-02-27 DIAGNOSIS — E039 Hypothyroidism, unspecified: Secondary | ICD-10-CM | POA: Diagnosis not present

## 2022-02-27 DIAGNOSIS — F0280 Dementia in other diseases classified elsewhere without behavioral disturbance: Secondary | ICD-10-CM | POA: Diagnosis not present

## 2022-03-03 DIAGNOSIS — G309 Alzheimer's disease, unspecified: Secondary | ICD-10-CM | POA: Diagnosis not present

## 2022-03-03 DIAGNOSIS — I10 Essential (primary) hypertension: Secondary | ICD-10-CM | POA: Diagnosis not present

## 2022-03-03 DIAGNOSIS — F0280 Dementia in other diseases classified elsewhere without behavioral disturbance: Secondary | ICD-10-CM | POA: Diagnosis not present

## 2022-03-03 DIAGNOSIS — E039 Hypothyroidism, unspecified: Secondary | ICD-10-CM | POA: Diagnosis not present

## 2022-03-03 DIAGNOSIS — R1310 Dysphagia, unspecified: Secondary | ICD-10-CM | POA: Diagnosis not present

## 2022-03-03 DIAGNOSIS — E785 Hyperlipidemia, unspecified: Secondary | ICD-10-CM | POA: Diagnosis not present

## 2022-03-04 DIAGNOSIS — R443 Hallucinations, unspecified: Secondary | ICD-10-CM | POA: Diagnosis not present

## 2022-03-04 DIAGNOSIS — D649 Anemia, unspecified: Secondary | ICD-10-CM | POA: Diagnosis not present

## 2022-03-04 DIAGNOSIS — K5900 Constipation, unspecified: Secondary | ICD-10-CM | POA: Diagnosis not present

## 2022-03-04 DIAGNOSIS — E039 Hypothyroidism, unspecified: Secondary | ICD-10-CM | POA: Diagnosis not present

## 2022-03-04 DIAGNOSIS — I10 Essential (primary) hypertension: Secondary | ICD-10-CM | POA: Diagnosis not present

## 2022-03-04 DIAGNOSIS — F32A Depression, unspecified: Secondary | ICD-10-CM | POA: Diagnosis not present

## 2022-03-04 DIAGNOSIS — J9690 Respiratory failure, unspecified, unspecified whether with hypoxia or hypercapnia: Secondary | ICD-10-CM | POA: Diagnosis not present

## 2022-03-04 DIAGNOSIS — N189 Chronic kidney disease, unspecified: Secondary | ICD-10-CM | POA: Diagnosis not present

## 2022-03-04 DIAGNOSIS — K449 Diaphragmatic hernia without obstruction or gangrene: Secondary | ICD-10-CM | POA: Diagnosis not present

## 2022-03-04 DIAGNOSIS — R1310 Dysphagia, unspecified: Secondary | ICD-10-CM | POA: Diagnosis not present

## 2022-03-06 DIAGNOSIS — F0280 Dementia in other diseases classified elsewhere without behavioral disturbance: Secondary | ICD-10-CM | POA: Diagnosis not present

## 2022-03-06 DIAGNOSIS — E785 Hyperlipidemia, unspecified: Secondary | ICD-10-CM | POA: Diagnosis not present

## 2022-03-06 DIAGNOSIS — I10 Essential (primary) hypertension: Secondary | ICD-10-CM | POA: Diagnosis not present

## 2022-03-06 DIAGNOSIS — E039 Hypothyroidism, unspecified: Secondary | ICD-10-CM | POA: Diagnosis not present

## 2022-03-06 DIAGNOSIS — G309 Alzheimer's disease, unspecified: Secondary | ICD-10-CM | POA: Diagnosis not present

## 2022-03-06 DIAGNOSIS — R1310 Dysphagia, unspecified: Secondary | ICD-10-CM | POA: Diagnosis not present

## 2022-03-10 DIAGNOSIS — R1310 Dysphagia, unspecified: Secondary | ICD-10-CM | POA: Diagnosis not present

## 2022-03-10 DIAGNOSIS — F0280 Dementia in other diseases classified elsewhere without behavioral disturbance: Secondary | ICD-10-CM | POA: Diagnosis not present

## 2022-03-10 DIAGNOSIS — I10 Essential (primary) hypertension: Secondary | ICD-10-CM | POA: Diagnosis not present

## 2022-03-10 DIAGNOSIS — G309 Alzheimer's disease, unspecified: Secondary | ICD-10-CM | POA: Diagnosis not present

## 2022-03-10 DIAGNOSIS — E785 Hyperlipidemia, unspecified: Secondary | ICD-10-CM | POA: Diagnosis not present

## 2022-03-10 DIAGNOSIS — E039 Hypothyroidism, unspecified: Secondary | ICD-10-CM | POA: Diagnosis not present

## 2022-03-12 DIAGNOSIS — G309 Alzheimer's disease, unspecified: Secondary | ICD-10-CM | POA: Diagnosis not present

## 2022-03-12 DIAGNOSIS — R1310 Dysphagia, unspecified: Secondary | ICD-10-CM | POA: Diagnosis not present

## 2022-03-12 DIAGNOSIS — F0280 Dementia in other diseases classified elsewhere without behavioral disturbance: Secondary | ICD-10-CM | POA: Diagnosis not present

## 2022-03-12 DIAGNOSIS — I10 Essential (primary) hypertension: Secondary | ICD-10-CM | POA: Diagnosis not present

## 2022-03-12 DIAGNOSIS — E039 Hypothyroidism, unspecified: Secondary | ICD-10-CM | POA: Diagnosis not present

## 2022-03-12 DIAGNOSIS — E785 Hyperlipidemia, unspecified: Secondary | ICD-10-CM | POA: Diagnosis not present

## 2022-03-13 DIAGNOSIS — E039 Hypothyroidism, unspecified: Secondary | ICD-10-CM | POA: Diagnosis not present

## 2022-03-13 DIAGNOSIS — E785 Hyperlipidemia, unspecified: Secondary | ICD-10-CM | POA: Diagnosis not present

## 2022-03-13 DIAGNOSIS — F0280 Dementia in other diseases classified elsewhere without behavioral disturbance: Secondary | ICD-10-CM | POA: Diagnosis not present

## 2022-03-13 DIAGNOSIS — R1310 Dysphagia, unspecified: Secondary | ICD-10-CM | POA: Diagnosis not present

## 2022-03-13 DIAGNOSIS — G309 Alzheimer's disease, unspecified: Secondary | ICD-10-CM | POA: Diagnosis not present

## 2022-03-13 DIAGNOSIS — I10 Essential (primary) hypertension: Secondary | ICD-10-CM | POA: Diagnosis not present

## 2022-03-17 DIAGNOSIS — G309 Alzheimer's disease, unspecified: Secondary | ICD-10-CM | POA: Diagnosis not present

## 2022-03-17 DIAGNOSIS — I10 Essential (primary) hypertension: Secondary | ICD-10-CM | POA: Diagnosis not present

## 2022-03-17 DIAGNOSIS — E785 Hyperlipidemia, unspecified: Secondary | ICD-10-CM | POA: Diagnosis not present

## 2022-03-17 DIAGNOSIS — R1310 Dysphagia, unspecified: Secondary | ICD-10-CM | POA: Diagnosis not present

## 2022-03-17 DIAGNOSIS — F0280 Dementia in other diseases classified elsewhere without behavioral disturbance: Secondary | ICD-10-CM | POA: Diagnosis not present

## 2022-03-17 DIAGNOSIS — E039 Hypothyroidism, unspecified: Secondary | ICD-10-CM | POA: Diagnosis not present

## 2022-03-17 DIAGNOSIS — L89152 Pressure ulcer of sacral region, stage 2: Secondary | ICD-10-CM | POA: Diagnosis not present

## 2022-03-19 DIAGNOSIS — D649 Anemia, unspecified: Secondary | ICD-10-CM | POA: Diagnosis not present

## 2022-03-19 DIAGNOSIS — N189 Chronic kidney disease, unspecified: Secondary | ICD-10-CM | POA: Diagnosis not present

## 2022-03-19 DIAGNOSIS — K219 Gastro-esophageal reflux disease without esophagitis: Secondary | ICD-10-CM | POA: Diagnosis not present

## 2022-03-19 DIAGNOSIS — R443 Hallucinations, unspecified: Secondary | ICD-10-CM | POA: Diagnosis not present

## 2022-03-19 DIAGNOSIS — K5900 Constipation, unspecified: Secondary | ICD-10-CM | POA: Diagnosis not present

## 2022-03-19 DIAGNOSIS — I10 Essential (primary) hypertension: Secondary | ICD-10-CM | POA: Diagnosis not present

## 2022-03-19 DIAGNOSIS — R1310 Dysphagia, unspecified: Secondary | ICD-10-CM | POA: Diagnosis not present

## 2022-03-19 DIAGNOSIS — J9690 Respiratory failure, unspecified, unspecified whether with hypoxia or hypercapnia: Secondary | ICD-10-CM | POA: Diagnosis not present

## 2022-03-20 DIAGNOSIS — E039 Hypothyroidism, unspecified: Secondary | ICD-10-CM | POA: Diagnosis not present

## 2022-03-20 DIAGNOSIS — R1310 Dysphagia, unspecified: Secondary | ICD-10-CM | POA: Diagnosis not present

## 2022-03-20 DIAGNOSIS — E785 Hyperlipidemia, unspecified: Secondary | ICD-10-CM | POA: Diagnosis not present

## 2022-03-20 DIAGNOSIS — F0280 Dementia in other diseases classified elsewhere without behavioral disturbance: Secondary | ICD-10-CM | POA: Diagnosis not present

## 2022-03-20 DIAGNOSIS — G309 Alzheimer's disease, unspecified: Secondary | ICD-10-CM | POA: Diagnosis not present

## 2022-03-20 DIAGNOSIS — I10 Essential (primary) hypertension: Secondary | ICD-10-CM | POA: Diagnosis not present

## 2022-03-21 DIAGNOSIS — K219 Gastro-esophageal reflux disease without esophagitis: Secondary | ICD-10-CM | POA: Diagnosis not present

## 2022-03-21 DIAGNOSIS — R1310 Dysphagia, unspecified: Secondary | ICD-10-CM | POA: Diagnosis not present

## 2022-03-21 DIAGNOSIS — G309 Alzheimer's disease, unspecified: Secondary | ICD-10-CM | POA: Diagnosis not present

## 2022-03-21 DIAGNOSIS — E785 Hyperlipidemia, unspecified: Secondary | ICD-10-CM | POA: Diagnosis not present

## 2022-03-21 DIAGNOSIS — E039 Hypothyroidism, unspecified: Secondary | ICD-10-CM | POA: Diagnosis not present

## 2022-03-21 DIAGNOSIS — I10 Essential (primary) hypertension: Secondary | ICD-10-CM | POA: Diagnosis not present

## 2022-03-21 DIAGNOSIS — F0280 Dementia in other diseases classified elsewhere without behavioral disturbance: Secondary | ICD-10-CM | POA: Diagnosis not present

## 2022-03-23 DIAGNOSIS — G300 Alzheimer's disease with early onset: Secondary | ICD-10-CM | POA: Diagnosis not present

## 2022-03-23 DIAGNOSIS — F3112 Bipolar disorder, current episode manic without psychotic features, moderate: Secondary | ICD-10-CM | POA: Diagnosis not present

## 2022-03-23 DIAGNOSIS — F29 Unspecified psychosis not due to a substance or known physiological condition: Secondary | ICD-10-CM | POA: Diagnosis not present

## 2022-03-24 DIAGNOSIS — E785 Hyperlipidemia, unspecified: Secondary | ICD-10-CM | POA: Diagnosis not present

## 2022-03-24 DIAGNOSIS — I10 Essential (primary) hypertension: Secondary | ICD-10-CM | POA: Diagnosis not present

## 2022-03-24 DIAGNOSIS — F0280 Dementia in other diseases classified elsewhere without behavioral disturbance: Secondary | ICD-10-CM | POA: Diagnosis not present

## 2022-03-24 DIAGNOSIS — L89152 Pressure ulcer of sacral region, stage 2: Secondary | ICD-10-CM | POA: Diagnosis not present

## 2022-03-24 DIAGNOSIS — E039 Hypothyroidism, unspecified: Secondary | ICD-10-CM | POA: Diagnosis not present

## 2022-03-24 DIAGNOSIS — G309 Alzheimer's disease, unspecified: Secondary | ICD-10-CM | POA: Diagnosis not present

## 2022-03-24 DIAGNOSIS — R1310 Dysphagia, unspecified: Secondary | ICD-10-CM | POA: Diagnosis not present

## 2022-03-26 DIAGNOSIS — R1310 Dysphagia, unspecified: Secondary | ICD-10-CM | POA: Diagnosis not present

## 2022-03-26 DIAGNOSIS — F0280 Dementia in other diseases classified elsewhere without behavioral disturbance: Secondary | ICD-10-CM | POA: Diagnosis not present

## 2022-03-26 DIAGNOSIS — E785 Hyperlipidemia, unspecified: Secondary | ICD-10-CM | POA: Diagnosis not present

## 2022-03-26 DIAGNOSIS — E039 Hypothyroidism, unspecified: Secondary | ICD-10-CM | POA: Diagnosis not present

## 2022-03-26 DIAGNOSIS — N189 Chronic kidney disease, unspecified: Secondary | ICD-10-CM | POA: Diagnosis not present

## 2022-03-26 DIAGNOSIS — I10 Essential (primary) hypertension: Secondary | ICD-10-CM | POA: Diagnosis not present

## 2022-03-26 DIAGNOSIS — E559 Vitamin D deficiency, unspecified: Secondary | ICD-10-CM | POA: Diagnosis not present

## 2022-03-26 DIAGNOSIS — F0390 Unspecified dementia without behavioral disturbance: Secondary | ICD-10-CM | POA: Diagnosis not present

## 2022-03-26 DIAGNOSIS — G309 Alzheimer's disease, unspecified: Secondary | ICD-10-CM | POA: Diagnosis not present

## 2022-03-27 DIAGNOSIS — E039 Hypothyroidism, unspecified: Secondary | ICD-10-CM | POA: Diagnosis not present

## 2022-03-27 DIAGNOSIS — G309 Alzheimer's disease, unspecified: Secondary | ICD-10-CM | POA: Diagnosis not present

## 2022-03-27 DIAGNOSIS — R1310 Dysphagia, unspecified: Secondary | ICD-10-CM | POA: Diagnosis not present

## 2022-03-27 DIAGNOSIS — E785 Hyperlipidemia, unspecified: Secondary | ICD-10-CM | POA: Diagnosis not present

## 2022-03-27 DIAGNOSIS — I10 Essential (primary) hypertension: Secondary | ICD-10-CM | POA: Diagnosis not present

## 2022-03-27 DIAGNOSIS — F0280 Dementia in other diseases classified elsewhere without behavioral disturbance: Secondary | ICD-10-CM | POA: Diagnosis not present

## 2022-03-31 DIAGNOSIS — F0280 Dementia in other diseases classified elsewhere without behavioral disturbance: Secondary | ICD-10-CM | POA: Diagnosis not present

## 2022-03-31 DIAGNOSIS — I10 Essential (primary) hypertension: Secondary | ICD-10-CM | POA: Diagnosis not present

## 2022-03-31 DIAGNOSIS — E039 Hypothyroidism, unspecified: Secondary | ICD-10-CM | POA: Diagnosis not present

## 2022-03-31 DIAGNOSIS — R1310 Dysphagia, unspecified: Secondary | ICD-10-CM | POA: Diagnosis not present

## 2022-03-31 DIAGNOSIS — G309 Alzheimer's disease, unspecified: Secondary | ICD-10-CM | POA: Diagnosis not present

## 2022-03-31 DIAGNOSIS — E785 Hyperlipidemia, unspecified: Secondary | ICD-10-CM | POA: Diagnosis not present

## 2022-04-01 DIAGNOSIS — K5900 Constipation, unspecified: Secondary | ICD-10-CM | POA: Diagnosis not present

## 2022-04-01 DIAGNOSIS — J9690 Respiratory failure, unspecified, unspecified whether with hypoxia or hypercapnia: Secondary | ICD-10-CM | POA: Diagnosis not present

## 2022-04-01 DIAGNOSIS — G309 Alzheimer's disease, unspecified: Secondary | ICD-10-CM | POA: Diagnosis not present

## 2022-04-01 DIAGNOSIS — I10 Essential (primary) hypertension: Secondary | ICD-10-CM | POA: Diagnosis not present

## 2022-04-01 DIAGNOSIS — E039 Hypothyroidism, unspecified: Secondary | ICD-10-CM | POA: Diagnosis not present

## 2022-04-01 DIAGNOSIS — R443 Hallucinations, unspecified: Secondary | ICD-10-CM | POA: Diagnosis not present

## 2022-04-01 DIAGNOSIS — N189 Chronic kidney disease, unspecified: Secondary | ICD-10-CM | POA: Diagnosis not present

## 2022-04-01 DIAGNOSIS — F0280 Dementia in other diseases classified elsewhere without behavioral disturbance: Secondary | ICD-10-CM | POA: Diagnosis not present

## 2022-04-01 DIAGNOSIS — K219 Gastro-esophageal reflux disease without esophagitis: Secondary | ICD-10-CM | POA: Diagnosis not present

## 2022-04-01 DIAGNOSIS — S42302D Unspecified fracture of shaft of humerus, left arm, subsequent encounter for fracture with routine healing: Secondary | ICD-10-CM | POA: Diagnosis not present

## 2022-04-01 DIAGNOSIS — E785 Hyperlipidemia, unspecified: Secondary | ICD-10-CM | POA: Diagnosis not present

## 2022-04-01 DIAGNOSIS — R1310 Dysphagia, unspecified: Secondary | ICD-10-CM | POA: Diagnosis not present

## 2022-04-03 DIAGNOSIS — I10 Essential (primary) hypertension: Secondary | ICD-10-CM | POA: Diagnosis not present

## 2022-04-03 DIAGNOSIS — E785 Hyperlipidemia, unspecified: Secondary | ICD-10-CM | POA: Diagnosis not present

## 2022-04-03 DIAGNOSIS — G309 Alzheimer's disease, unspecified: Secondary | ICD-10-CM | POA: Diagnosis not present

## 2022-04-03 DIAGNOSIS — R1310 Dysphagia, unspecified: Secondary | ICD-10-CM | POA: Diagnosis not present

## 2022-04-03 DIAGNOSIS — E039 Hypothyroidism, unspecified: Secondary | ICD-10-CM | POA: Diagnosis not present

## 2022-04-03 DIAGNOSIS — F0280 Dementia in other diseases classified elsewhere without behavioral disturbance: Secondary | ICD-10-CM | POA: Diagnosis not present

## 2022-04-07 DIAGNOSIS — E785 Hyperlipidemia, unspecified: Secondary | ICD-10-CM | POA: Diagnosis not present

## 2022-04-07 DIAGNOSIS — E039 Hypothyroidism, unspecified: Secondary | ICD-10-CM | POA: Diagnosis not present

## 2022-04-07 DIAGNOSIS — I10 Essential (primary) hypertension: Secondary | ICD-10-CM | POA: Diagnosis not present

## 2022-04-07 DIAGNOSIS — G309 Alzheimer's disease, unspecified: Secondary | ICD-10-CM | POA: Diagnosis not present

## 2022-04-07 DIAGNOSIS — L89152 Pressure ulcer of sacral region, stage 2: Secondary | ICD-10-CM | POA: Diagnosis not present

## 2022-04-07 DIAGNOSIS — R1310 Dysphagia, unspecified: Secondary | ICD-10-CM | POA: Diagnosis not present

## 2022-04-07 DIAGNOSIS — F0280 Dementia in other diseases classified elsewhere without behavioral disturbance: Secondary | ICD-10-CM | POA: Diagnosis not present

## 2022-04-10 DIAGNOSIS — E039 Hypothyroidism, unspecified: Secondary | ICD-10-CM | POA: Diagnosis not present

## 2022-04-10 DIAGNOSIS — G309 Alzheimer's disease, unspecified: Secondary | ICD-10-CM | POA: Diagnosis not present

## 2022-04-10 DIAGNOSIS — F0280 Dementia in other diseases classified elsewhere without behavioral disturbance: Secondary | ICD-10-CM | POA: Diagnosis not present

## 2022-04-10 DIAGNOSIS — R1310 Dysphagia, unspecified: Secondary | ICD-10-CM | POA: Diagnosis not present

## 2022-04-10 DIAGNOSIS — I10 Essential (primary) hypertension: Secondary | ICD-10-CM | POA: Diagnosis not present

## 2022-04-10 DIAGNOSIS — E785 Hyperlipidemia, unspecified: Secondary | ICD-10-CM | POA: Diagnosis not present

## 2022-04-14 DIAGNOSIS — F0280 Dementia in other diseases classified elsewhere without behavioral disturbance: Secondary | ICD-10-CM | POA: Diagnosis not present

## 2022-04-14 DIAGNOSIS — L89152 Pressure ulcer of sacral region, stage 2: Secondary | ICD-10-CM | POA: Diagnosis not present

## 2022-04-14 DIAGNOSIS — G309 Alzheimer's disease, unspecified: Secondary | ICD-10-CM | POA: Diagnosis not present

## 2022-04-14 DIAGNOSIS — I10 Essential (primary) hypertension: Secondary | ICD-10-CM | POA: Diagnosis not present

## 2022-04-14 DIAGNOSIS — E039 Hypothyroidism, unspecified: Secondary | ICD-10-CM | POA: Diagnosis not present

## 2022-04-14 DIAGNOSIS — E785 Hyperlipidemia, unspecified: Secondary | ICD-10-CM | POA: Diagnosis not present

## 2022-04-14 DIAGNOSIS — R1310 Dysphagia, unspecified: Secondary | ICD-10-CM | POA: Diagnosis not present

## 2022-04-14 IMAGING — DX DG PORTABLE PELVIS
1 series · 1 of 1 positions shown · non-contrast
Comparison: None.

CLINICAL DATA: Fell

EXAM:
PORTABLE PELVIS 1-2 VIEWS

[pelvis ap]
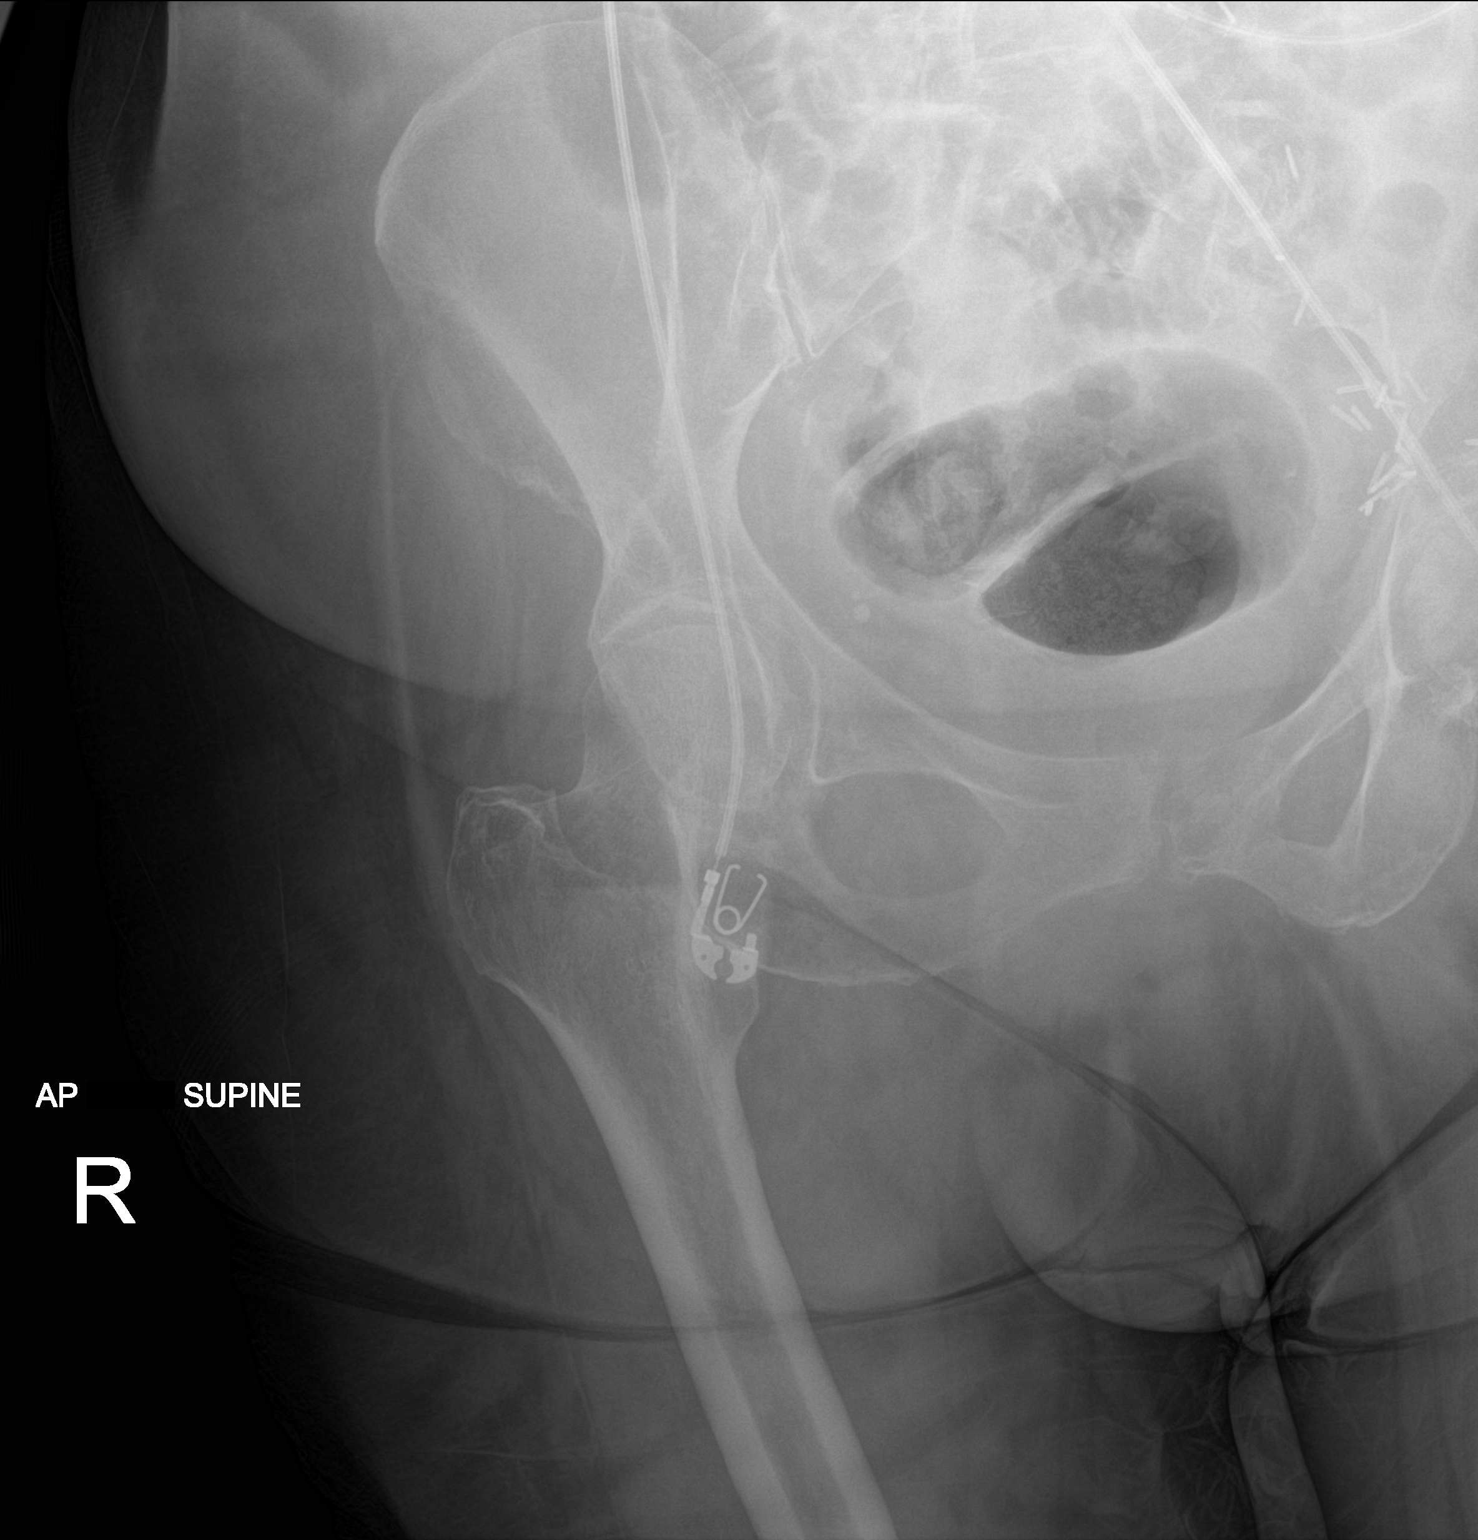

[1 of 1 positions shown; findings below may reference images not displayed]

FINDINGS: Supine frontal view of the pelvis was obtained, limited by
positioning and collimation. The left hemipelvis and left hip are
excluded by collimation. I do not see any acute fracture within the
visualized portions of the pelvis or right hip. Soft tissues are
unremarkable.
IMPRESSION: 1. No acute pelvic fracture on this limited evaluation.

## 2022-04-14 IMAGING — DX DG CHEST 1V PORT
1 series · 1 of 1 positions shown · non-contrast
Comparison: Chest radiograph dated 06/07/2010.

CLINICAL DATA: 89-year-old female with fall.

EXAM:
PORTABLE CHEST 1 VIEW

[chest ap]
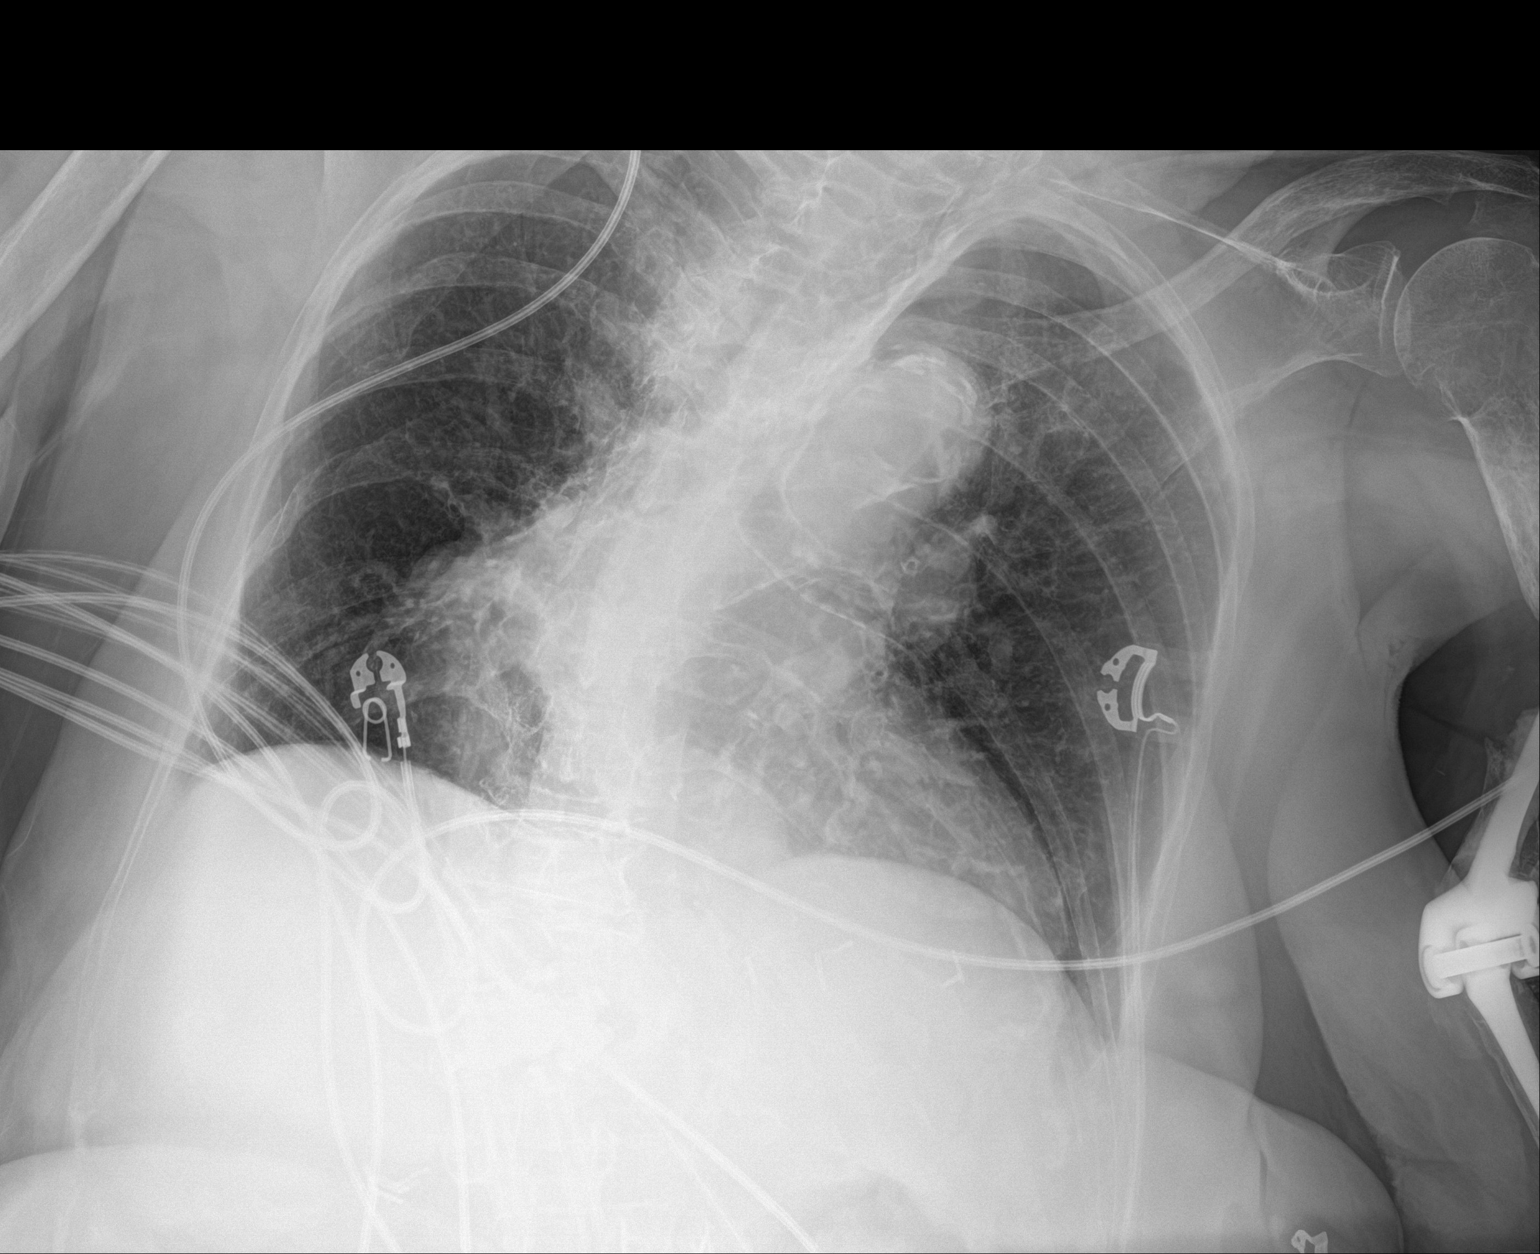

[1 of 1 positions shown; findings below may reference images not displayed]

FINDINGS: No focal consolidation, pleural effusion or pneumothorax. Stable
cardiac silhouette. Atherosclerotic calcification of the aorta.
Osteopenia with degenerative changes of the spine. Old right
posterior rib fractures. Left humeral neck fracture better seen on
the upper extremity radiograph. Partially visualized left upper
extremity hardware.
IMPRESSION: 1. No active cardiopulmonary disease.
2. Fracture of the left humeral neck.

## 2022-04-15 IMAGING — RF DG C-ARM 1-60 MIN
1 series · 11 of 11 positions shown · non-contrast
Comparison: January 27, 2021.

CLINICAL DATA: Trauma.  ORIF of right distal femoral fracture.

EXAM:
RIGHT FEMUR 2 VIEWS; DG C-ARM 1-60 MIN

[Series 1: run · 11 of 11 slices shown]
[im 1/11]
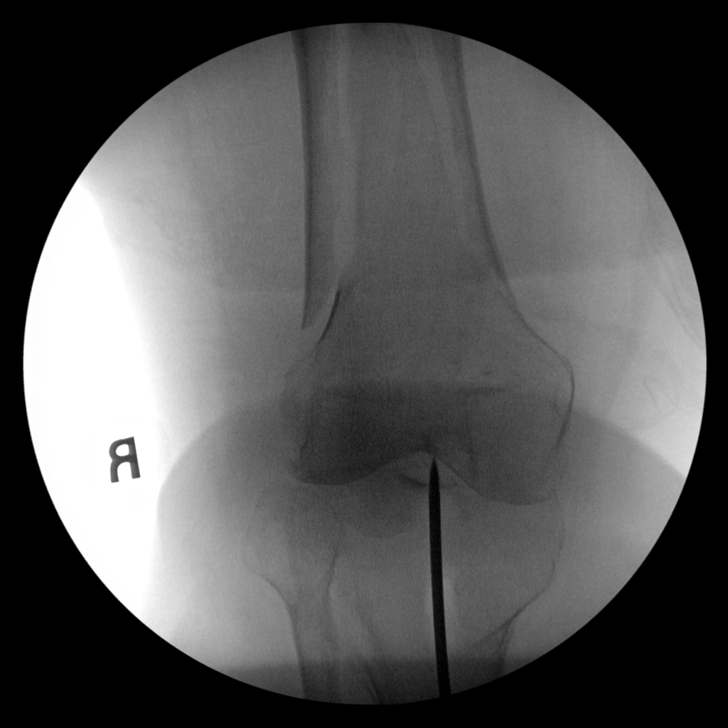
[im 2/11]
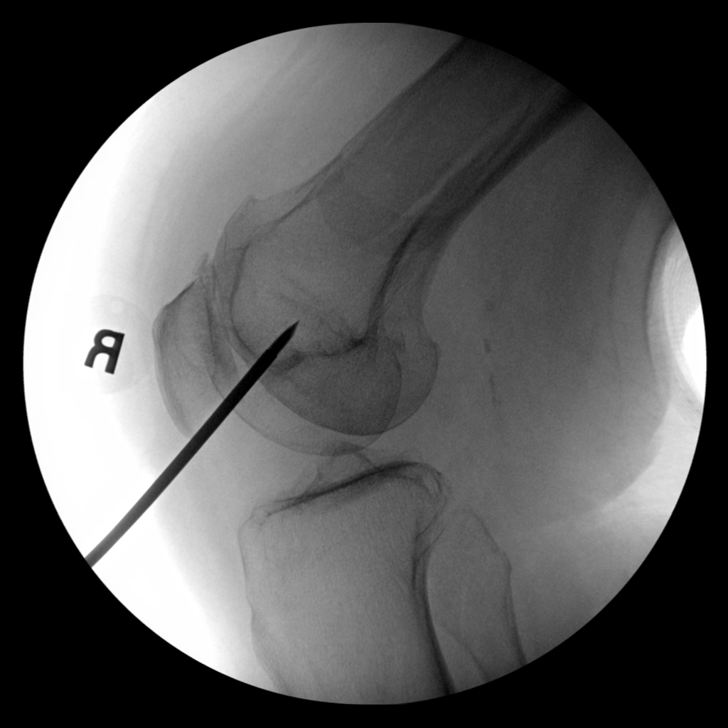
[im 3/11]
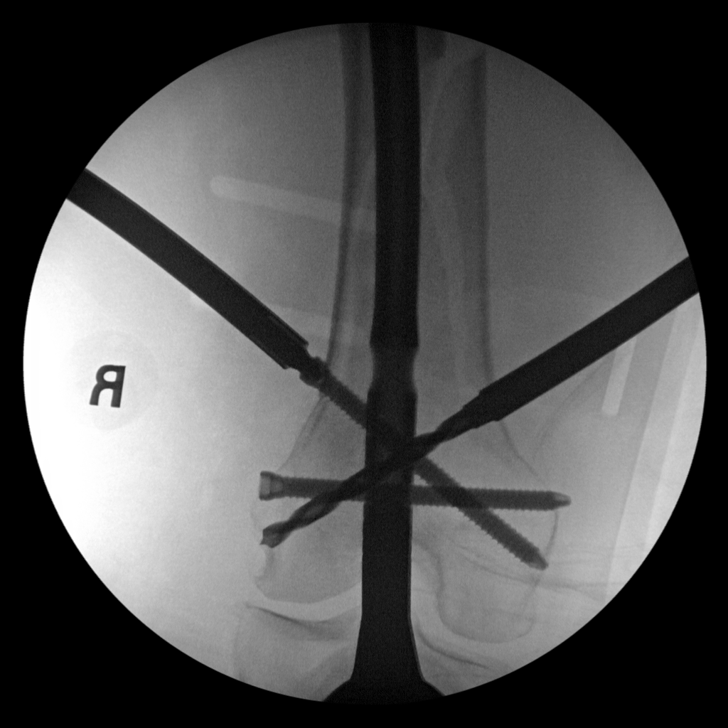
[im 4/11]
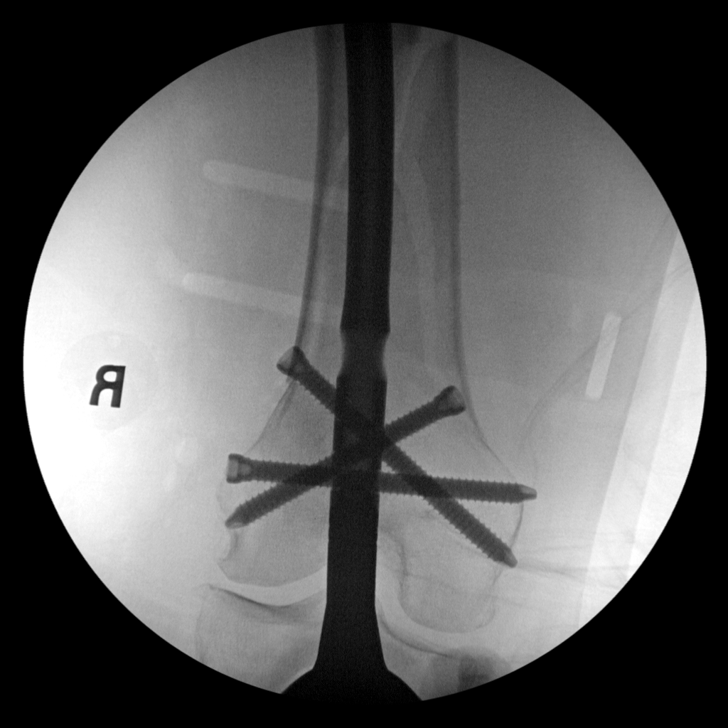
[im 5/11]
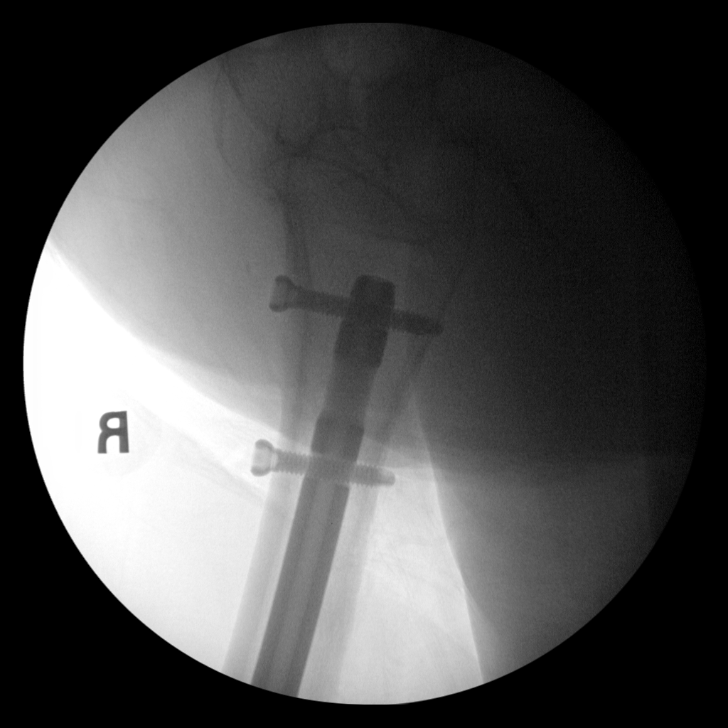
[im 6/11]
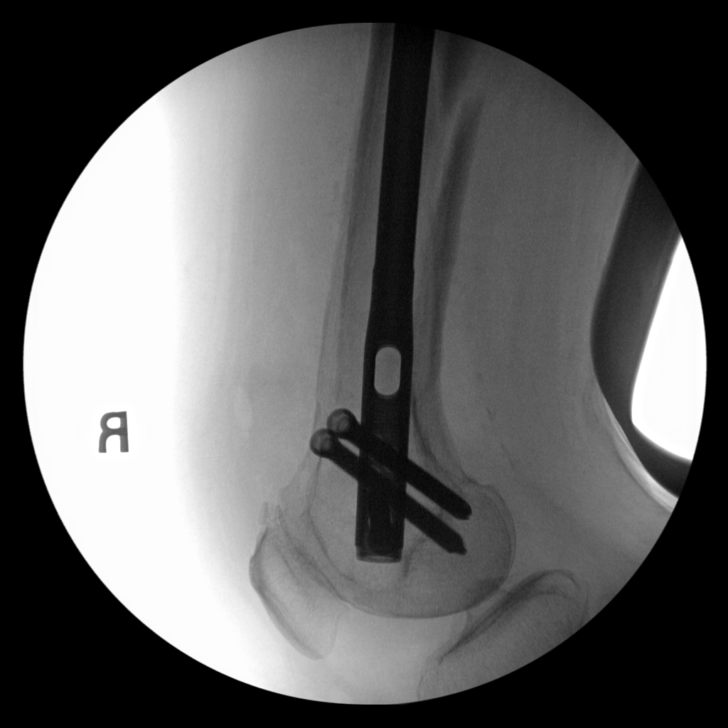
[im 7/11]
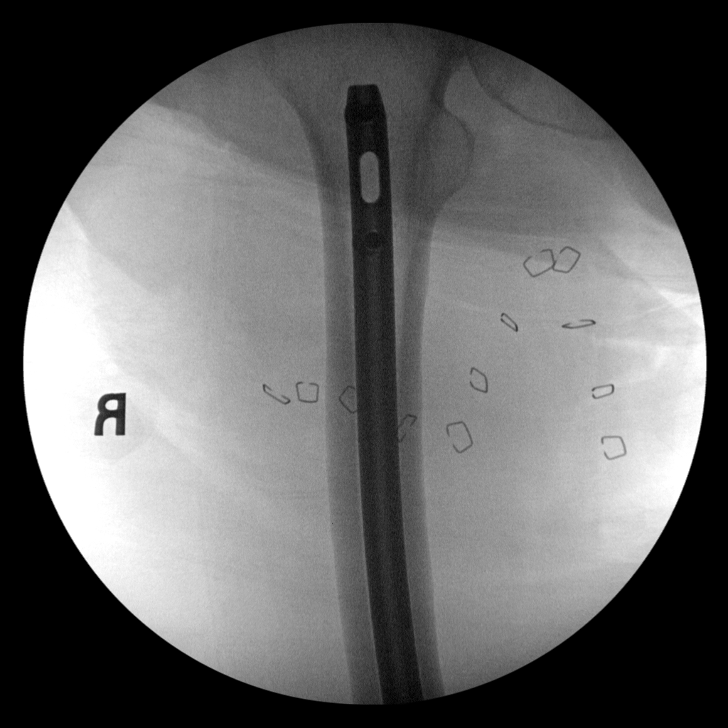
[im 8/11]
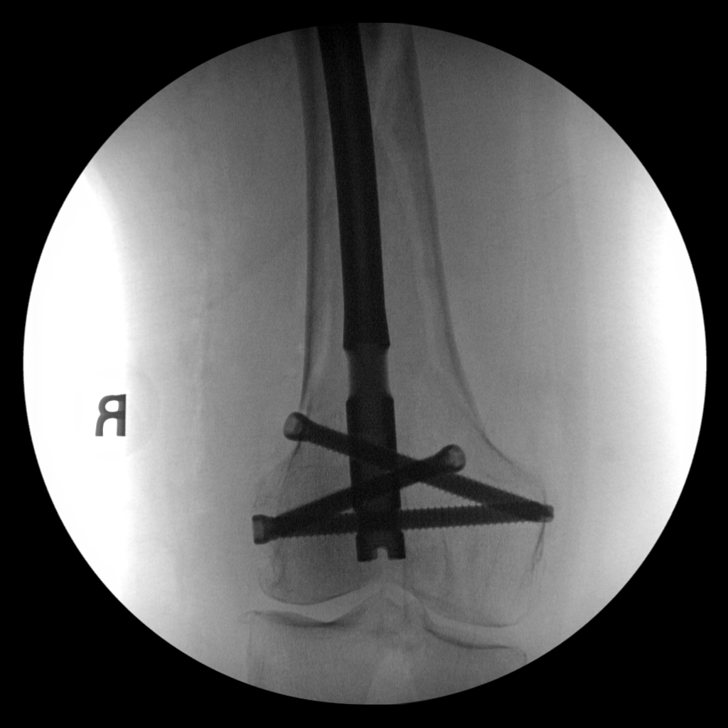
[im 9/11]
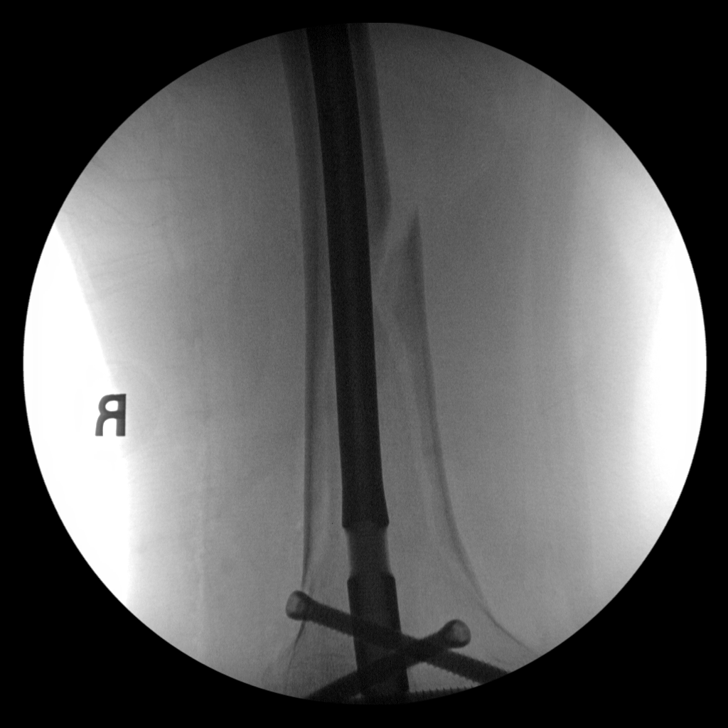
[im 10/11]
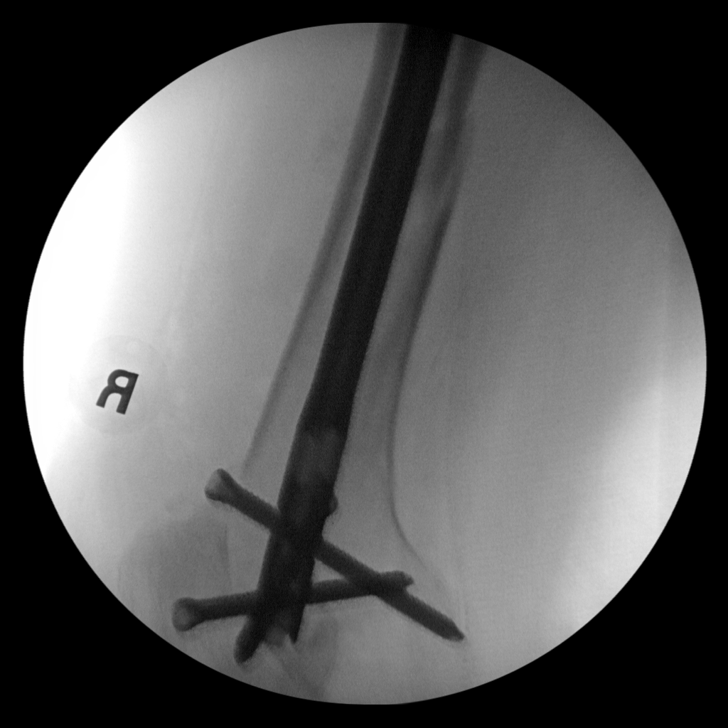
[im 11/11]
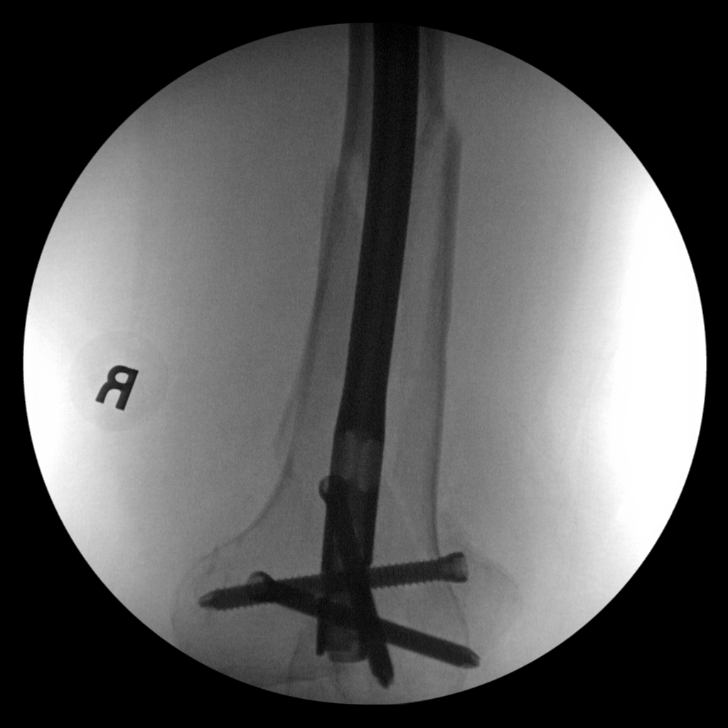

[11 of 11 positions shown; findings below may reference images not displayed]

FINDINGS: Fluoro time: 1 minutes 27 seconds.

Reported radiation: 10.68 mGy.

Nine C-arm fluoroscopic images were obtained intraoperatively and
submitted for post operative interpretation. These images
demonstrate surgical changes associated with intramedullary rod and
screw fixation of a distal femoral metadiaphyseal fracture. Final
images demonstrate improved, near anatomic alignment. Please see the
performing provider's procedural report for further detail.
IMPRESSION: Intraoperative fluoroscopic imaging, as detailed above.

## 2022-04-15 IMAGING — RF DG FEMUR 2+V*R*
1 series · 11 of 11 positions shown · non-contrast
Comparison: January 27, 2021.

CLINICAL DATA: Trauma.  ORIF of right distal femoral fracture.

EXAM:
RIGHT FEMUR 2 VIEWS; DG C-ARM 1-60 MIN

[Series 1: run · 11 of 11 slices shown]
[im 1/11]
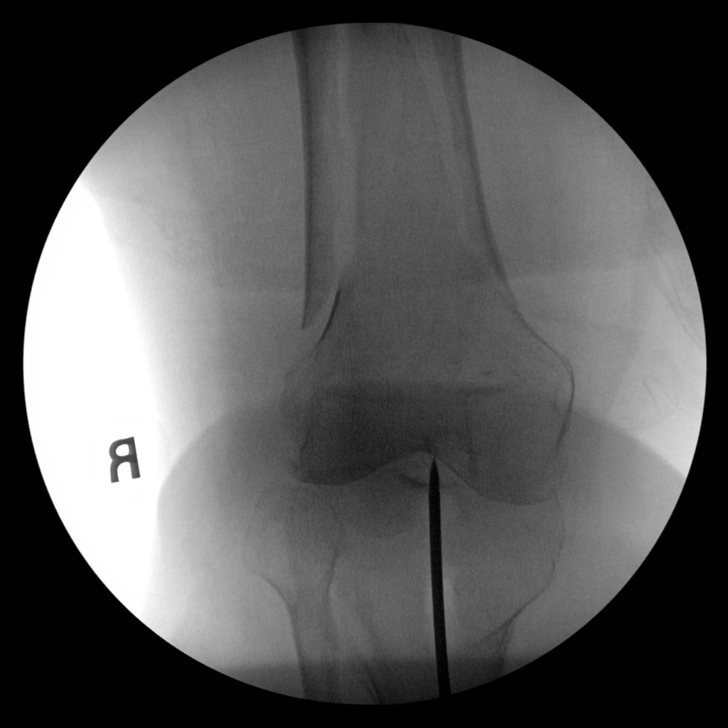
[im 2/11]
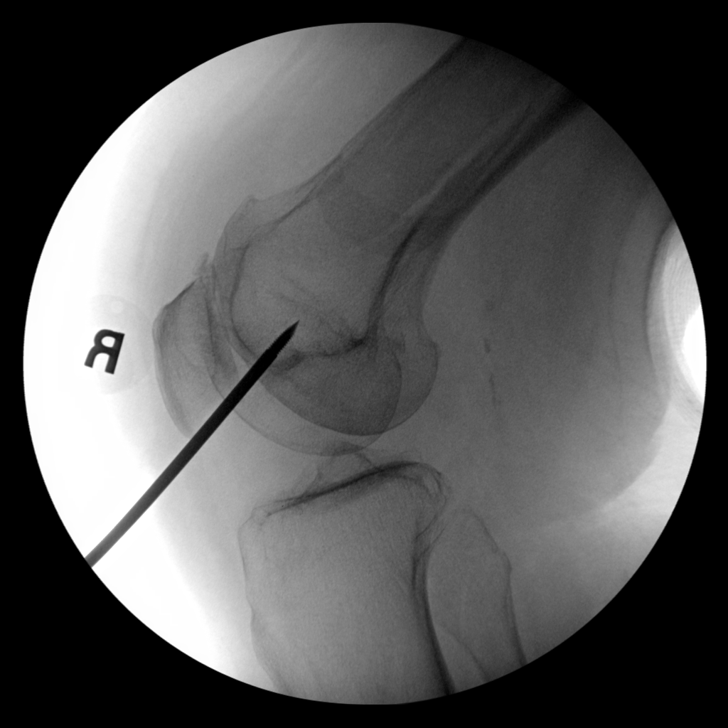
[im 3/11]
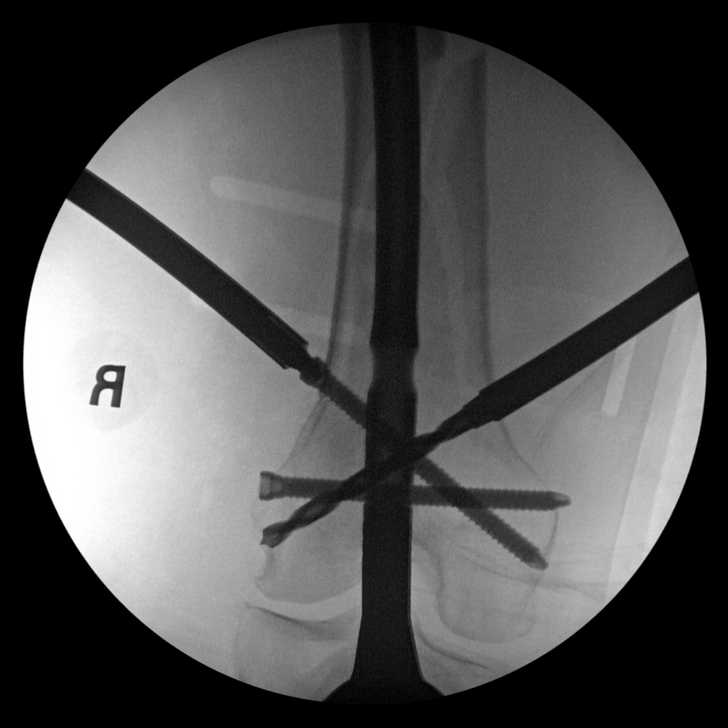
[im 4/11]
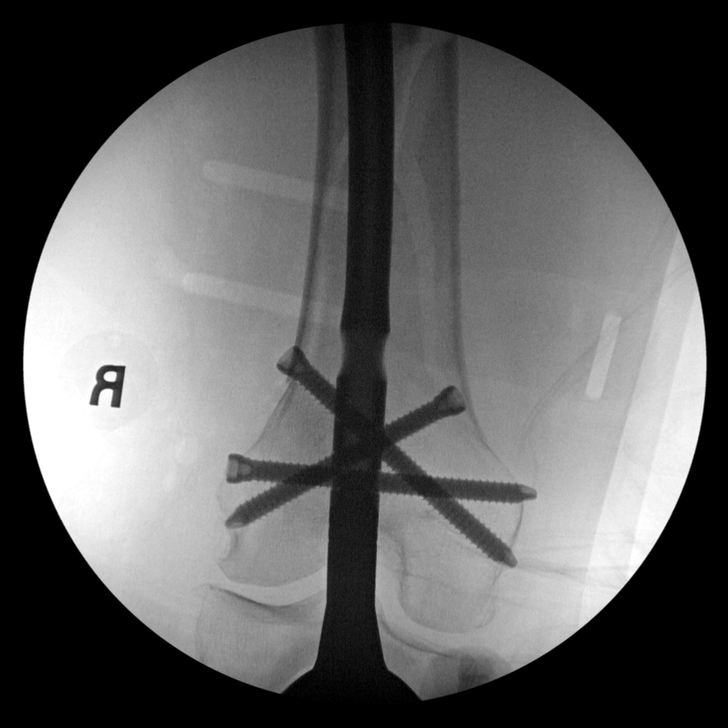
[im 5/11]
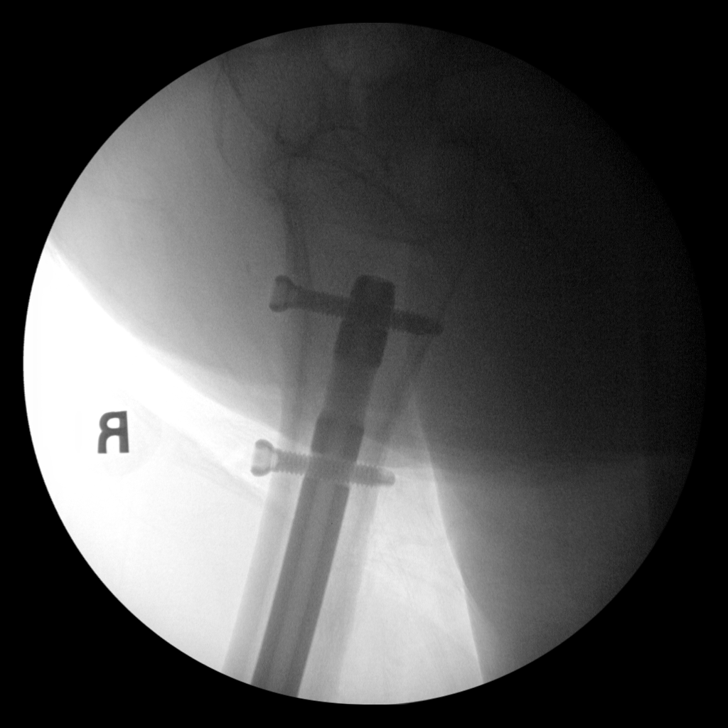
[im 6/11]
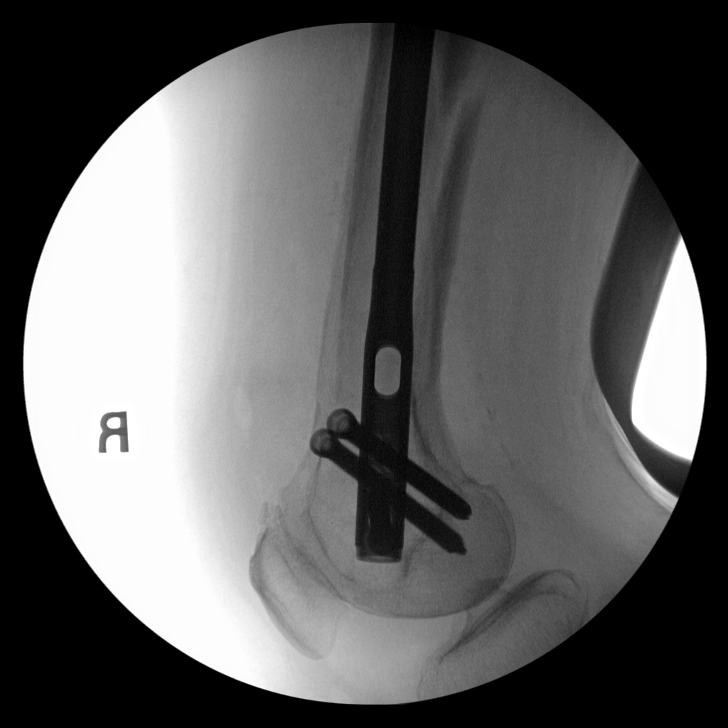
[im 7/11]
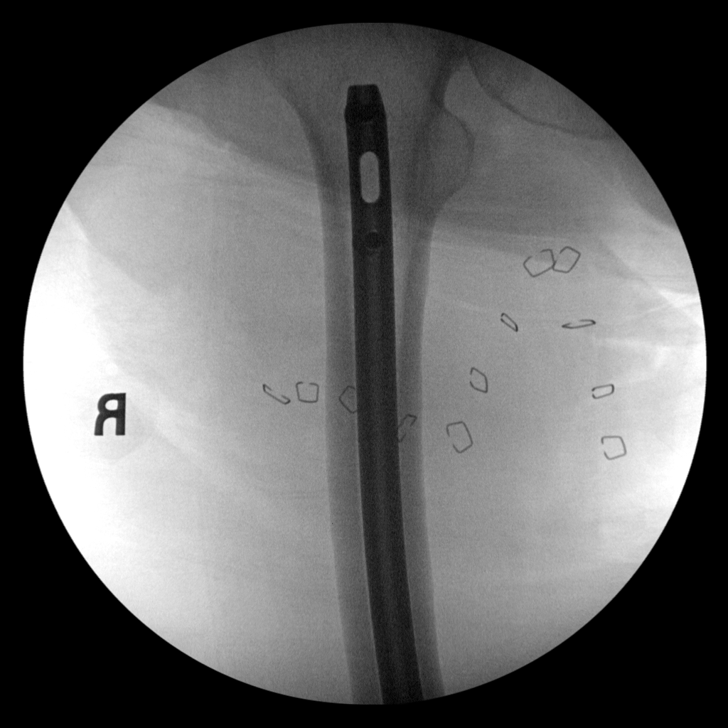
[im 8/11]
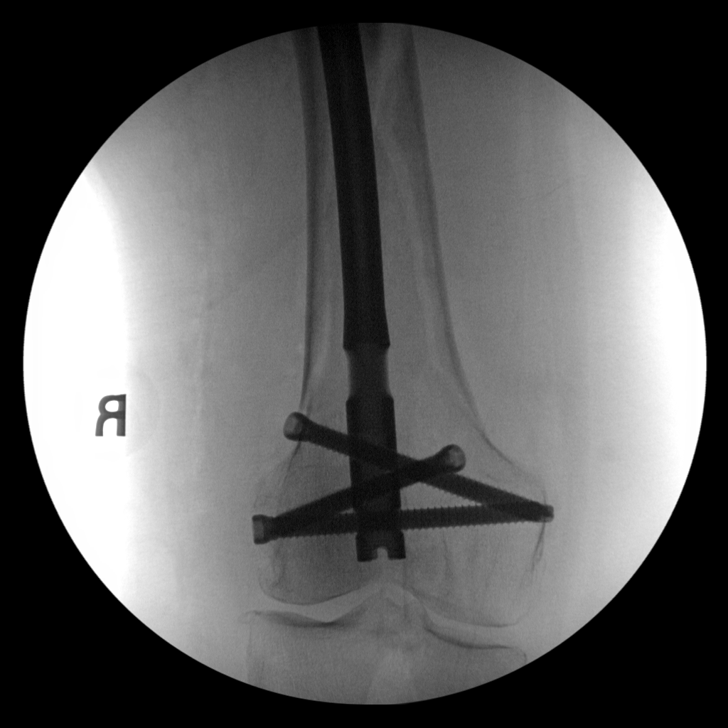
[im 9/11]
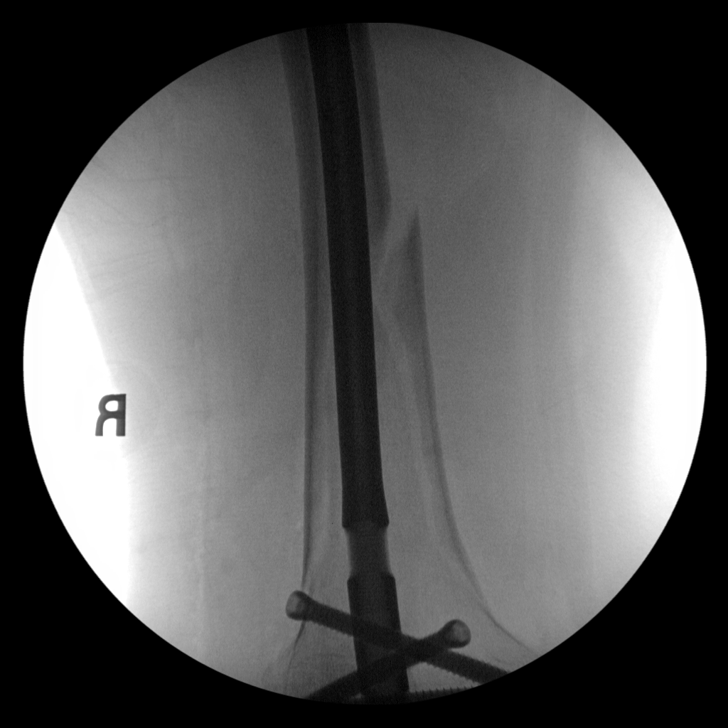
[im 10/11]
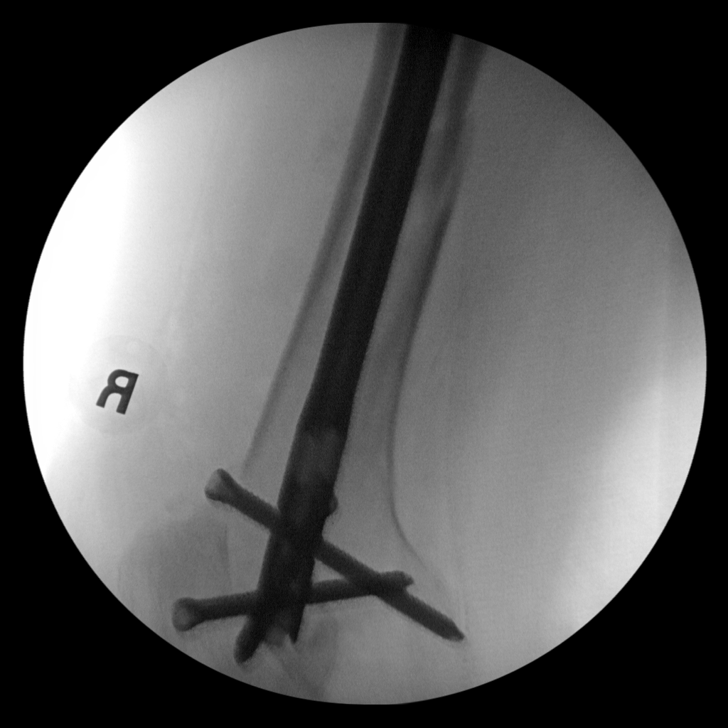
[im 11/11]
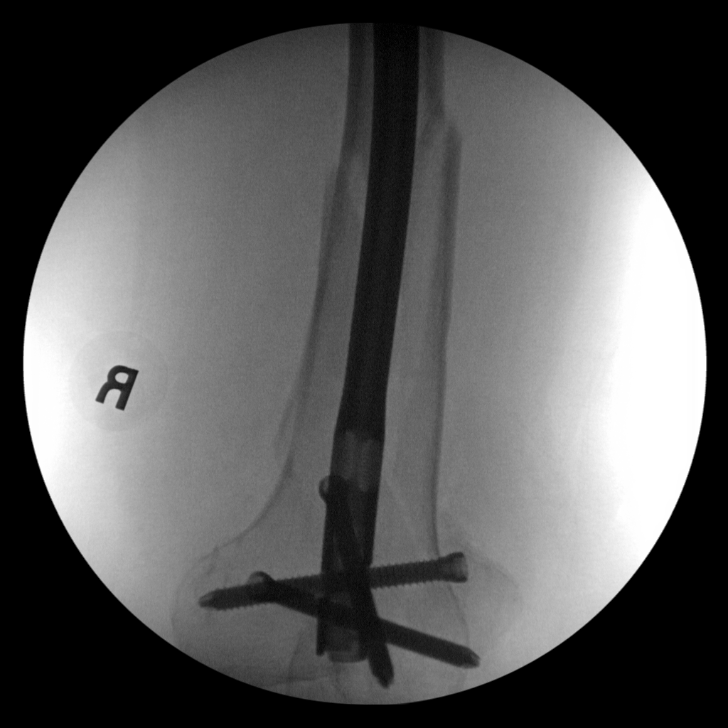

[11 of 11 positions shown; findings below may reference images not displayed]

FINDINGS: Fluoro time: 1 minutes 27 seconds.

Reported radiation: 10.68 mGy.

Nine C-arm fluoroscopic images were obtained intraoperatively and
submitted for post operative interpretation. These images
demonstrate surgical changes associated with intramedullary rod and
screw fixation of a distal femoral metadiaphyseal fracture. Final
images demonstrate improved, near anatomic alignment. Please see the
performing provider's procedural report for further detail.
IMPRESSION: Intraoperative fluoroscopic imaging, as detailed above.

## 2022-04-17 DIAGNOSIS — I10 Essential (primary) hypertension: Secondary | ICD-10-CM | POA: Diagnosis not present

## 2022-04-17 DIAGNOSIS — E785 Hyperlipidemia, unspecified: Secondary | ICD-10-CM | POA: Diagnosis not present

## 2022-04-17 DIAGNOSIS — E039 Hypothyroidism, unspecified: Secondary | ICD-10-CM | POA: Diagnosis not present

## 2022-04-17 DIAGNOSIS — F0280 Dementia in other diseases classified elsewhere without behavioral disturbance: Secondary | ICD-10-CM | POA: Diagnosis not present

## 2022-04-17 DIAGNOSIS — R1310 Dysphagia, unspecified: Secondary | ICD-10-CM | POA: Diagnosis not present

## 2022-04-17 DIAGNOSIS — G309 Alzheimer's disease, unspecified: Secondary | ICD-10-CM | POA: Diagnosis not present

## 2022-04-20 DIAGNOSIS — Z6823 Body mass index (BMI) 23.0-23.9, adult: Secondary | ICD-10-CM | POA: Diagnosis not present

## 2022-04-20 DIAGNOSIS — I10 Essential (primary) hypertension: Secondary | ICD-10-CM | POA: Diagnosis not present

## 2022-04-20 DIAGNOSIS — E785 Hyperlipidemia, unspecified: Secondary | ICD-10-CM | POA: Diagnosis not present

## 2022-04-20 DIAGNOSIS — E039 Hypothyroidism, unspecified: Secondary | ICD-10-CM | POA: Diagnosis not present

## 2022-04-20 DIAGNOSIS — K219 Gastro-esophageal reflux disease without esophagitis: Secondary | ICD-10-CM | POA: Diagnosis not present

## 2022-04-20 DIAGNOSIS — R159 Full incontinence of feces: Secondary | ICD-10-CM | POA: Diagnosis not present

## 2022-04-20 DIAGNOSIS — R32 Unspecified urinary incontinence: Secondary | ICD-10-CM | POA: Diagnosis not present

## 2022-04-20 DIAGNOSIS — R1310 Dysphagia, unspecified: Secondary | ICD-10-CM | POA: Diagnosis not present

## 2022-04-20 DIAGNOSIS — G309 Alzheimer's disease, unspecified: Secondary | ICD-10-CM | POA: Diagnosis not present

## 2022-04-20 DIAGNOSIS — F0280 Dementia in other diseases classified elsewhere without behavioral disturbance: Secondary | ICD-10-CM | POA: Diagnosis not present

## 2022-04-21 DIAGNOSIS — F0280 Dementia in other diseases classified elsewhere without behavioral disturbance: Secondary | ICD-10-CM | POA: Diagnosis not present

## 2022-04-21 DIAGNOSIS — R32 Unspecified urinary incontinence: Secondary | ICD-10-CM | POA: Diagnosis not present

## 2022-04-21 DIAGNOSIS — R1310 Dysphagia, unspecified: Secondary | ICD-10-CM | POA: Diagnosis not present

## 2022-04-21 DIAGNOSIS — E785 Hyperlipidemia, unspecified: Secondary | ICD-10-CM | POA: Diagnosis not present

## 2022-04-21 DIAGNOSIS — I10 Essential (primary) hypertension: Secondary | ICD-10-CM | POA: Diagnosis not present

## 2022-04-21 DIAGNOSIS — G309 Alzheimer's disease, unspecified: Secondary | ICD-10-CM | POA: Diagnosis not present

## 2022-04-23 DIAGNOSIS — R32 Unspecified urinary incontinence: Secondary | ICD-10-CM | POA: Diagnosis not present

## 2022-04-23 DIAGNOSIS — G309 Alzheimer's disease, unspecified: Secondary | ICD-10-CM | POA: Diagnosis not present

## 2022-04-23 DIAGNOSIS — I10 Essential (primary) hypertension: Secondary | ICD-10-CM | POA: Diagnosis not present

## 2022-04-23 DIAGNOSIS — F0280 Dementia in other diseases classified elsewhere without behavioral disturbance: Secondary | ICD-10-CM | POA: Diagnosis not present

## 2022-04-23 DIAGNOSIS — E785 Hyperlipidemia, unspecified: Secondary | ICD-10-CM | POA: Diagnosis not present

## 2022-04-23 DIAGNOSIS — R1310 Dysphagia, unspecified: Secondary | ICD-10-CM | POA: Diagnosis not present

## 2022-04-24 DIAGNOSIS — N189 Chronic kidney disease, unspecified: Secondary | ICD-10-CM | POA: Diagnosis not present

## 2022-04-24 DIAGNOSIS — E559 Vitamin D deficiency, unspecified: Secondary | ICD-10-CM | POA: Diagnosis not present

## 2022-04-24 DIAGNOSIS — E039 Hypothyroidism, unspecified: Secondary | ICD-10-CM | POA: Diagnosis not present

## 2022-04-24 DIAGNOSIS — G309 Alzheimer's disease, unspecified: Secondary | ICD-10-CM | POA: Diagnosis not present

## 2022-04-24 DIAGNOSIS — R1310 Dysphagia, unspecified: Secondary | ICD-10-CM | POA: Diagnosis not present

## 2022-04-24 DIAGNOSIS — E785 Hyperlipidemia, unspecified: Secondary | ICD-10-CM | POA: Diagnosis not present

## 2022-04-24 DIAGNOSIS — I10 Essential (primary) hypertension: Secondary | ICD-10-CM | POA: Diagnosis not present

## 2022-04-24 DIAGNOSIS — F0280 Dementia in other diseases classified elsewhere without behavioral disturbance: Secondary | ICD-10-CM | POA: Diagnosis not present

## 2022-04-24 DIAGNOSIS — D500 Iron deficiency anemia secondary to blood loss (chronic): Secondary | ICD-10-CM | POA: Diagnosis not present

## 2022-04-24 DIAGNOSIS — R32 Unspecified urinary incontinence: Secondary | ICD-10-CM | POA: Diagnosis not present

## 2022-04-27 DIAGNOSIS — F3112 Bipolar disorder, current episode manic without psychotic features, moderate: Secondary | ICD-10-CM | POA: Diagnosis not present

## 2022-04-27 DIAGNOSIS — G300 Alzheimer's disease with early onset: Secondary | ICD-10-CM | POA: Diagnosis not present

## 2022-04-27 DIAGNOSIS — F29 Unspecified psychosis not due to a substance or known physiological condition: Secondary | ICD-10-CM | POA: Diagnosis not present

## 2022-04-28 DIAGNOSIS — I10 Essential (primary) hypertension: Secondary | ICD-10-CM | POA: Diagnosis not present

## 2022-04-28 DIAGNOSIS — G309 Alzheimer's disease, unspecified: Secondary | ICD-10-CM | POA: Diagnosis not present

## 2022-04-28 DIAGNOSIS — R1310 Dysphagia, unspecified: Secondary | ICD-10-CM | POA: Diagnosis not present

## 2022-04-28 DIAGNOSIS — R32 Unspecified urinary incontinence: Secondary | ICD-10-CM | POA: Diagnosis not present

## 2022-04-28 DIAGNOSIS — F0280 Dementia in other diseases classified elsewhere without behavioral disturbance: Secondary | ICD-10-CM | POA: Diagnosis not present

## 2022-04-28 DIAGNOSIS — E785 Hyperlipidemia, unspecified: Secondary | ICD-10-CM | POA: Diagnosis not present

## 2022-04-29 DIAGNOSIS — R059 Cough, unspecified: Secondary | ICD-10-CM | POA: Diagnosis not present

## 2022-04-29 DIAGNOSIS — K449 Diaphragmatic hernia without obstruction or gangrene: Secondary | ICD-10-CM | POA: Diagnosis not present

## 2022-04-29 DIAGNOSIS — E039 Hypothyroidism, unspecified: Secondary | ICD-10-CM | POA: Diagnosis not present

## 2022-04-29 DIAGNOSIS — K219 Gastro-esophageal reflux disease without esophagitis: Secondary | ICD-10-CM | POA: Diagnosis not present

## 2022-04-29 DIAGNOSIS — I10 Essential (primary) hypertension: Secondary | ICD-10-CM | POA: Diagnosis not present

## 2022-04-29 DIAGNOSIS — R0989 Other specified symptoms and signs involving the circulatory and respiratory systems: Secondary | ICD-10-CM | POA: Diagnosis not present

## 2022-04-29 DIAGNOSIS — F0390 Unspecified dementia without behavioral disturbance: Secondary | ICD-10-CM | POA: Diagnosis not present

## 2022-04-29 DIAGNOSIS — K5900 Constipation, unspecified: Secondary | ICD-10-CM | POA: Diagnosis not present

## 2022-04-29 DIAGNOSIS — N189 Chronic kidney disease, unspecified: Secondary | ICD-10-CM | POA: Diagnosis not present

## 2022-04-29 DIAGNOSIS — R1310 Dysphagia, unspecified: Secondary | ICD-10-CM | POA: Diagnosis not present

## 2022-04-30 DIAGNOSIS — R059 Cough, unspecified: Secondary | ICD-10-CM | POA: Diagnosis not present

## 2022-05-01 DIAGNOSIS — G309 Alzheimer's disease, unspecified: Secondary | ICD-10-CM | POA: Diagnosis not present

## 2022-05-01 DIAGNOSIS — F0280 Dementia in other diseases classified elsewhere without behavioral disturbance: Secondary | ICD-10-CM | POA: Diagnosis not present

## 2022-05-01 DIAGNOSIS — E785 Hyperlipidemia, unspecified: Secondary | ICD-10-CM | POA: Diagnosis not present

## 2022-05-01 DIAGNOSIS — I10 Essential (primary) hypertension: Secondary | ICD-10-CM | POA: Diagnosis not present

## 2022-05-01 DIAGNOSIS — R1310 Dysphagia, unspecified: Secondary | ICD-10-CM | POA: Diagnosis not present

## 2022-05-01 DIAGNOSIS — R32 Unspecified urinary incontinence: Secondary | ICD-10-CM | POA: Diagnosis not present

## 2022-05-04 DIAGNOSIS — R1310 Dysphagia, unspecified: Secondary | ICD-10-CM | POA: Diagnosis not present

## 2022-05-04 DIAGNOSIS — E785 Hyperlipidemia, unspecified: Secondary | ICD-10-CM | POA: Diagnosis not present

## 2022-05-04 DIAGNOSIS — R32 Unspecified urinary incontinence: Secondary | ICD-10-CM | POA: Diagnosis not present

## 2022-05-04 DIAGNOSIS — G309 Alzheimer's disease, unspecified: Secondary | ICD-10-CM | POA: Diagnosis not present

## 2022-05-04 DIAGNOSIS — I10 Essential (primary) hypertension: Secondary | ICD-10-CM | POA: Diagnosis not present

## 2022-05-04 DIAGNOSIS — F0280 Dementia in other diseases classified elsewhere without behavioral disturbance: Secondary | ICD-10-CM | POA: Diagnosis not present

## 2022-05-05 DIAGNOSIS — R1310 Dysphagia, unspecified: Secondary | ICD-10-CM | POA: Diagnosis not present

## 2022-05-05 DIAGNOSIS — E785 Hyperlipidemia, unspecified: Secondary | ICD-10-CM | POA: Diagnosis not present

## 2022-05-05 DIAGNOSIS — I10 Essential (primary) hypertension: Secondary | ICD-10-CM | POA: Diagnosis not present

## 2022-05-05 DIAGNOSIS — F0280 Dementia in other diseases classified elsewhere without behavioral disturbance: Secondary | ICD-10-CM | POA: Diagnosis not present

## 2022-05-05 DIAGNOSIS — G309 Alzheimer's disease, unspecified: Secondary | ICD-10-CM | POA: Diagnosis not present

## 2022-05-05 DIAGNOSIS — R32 Unspecified urinary incontinence: Secondary | ICD-10-CM | POA: Diagnosis not present

## 2022-05-07 DIAGNOSIS — E039 Hypothyroidism, unspecified: Secondary | ICD-10-CM | POA: Diagnosis not present

## 2022-05-07 DIAGNOSIS — R0989 Other specified symptoms and signs involving the circulatory and respiratory systems: Secondary | ICD-10-CM | POA: Diagnosis not present

## 2022-05-07 DIAGNOSIS — J9690 Respiratory failure, unspecified, unspecified whether with hypoxia or hypercapnia: Secondary | ICD-10-CM | POA: Diagnosis not present

## 2022-05-07 DIAGNOSIS — F32A Depression, unspecified: Secondary | ICD-10-CM | POA: Diagnosis not present

## 2022-05-07 DIAGNOSIS — R1310 Dysphagia, unspecified: Secondary | ICD-10-CM | POA: Diagnosis not present

## 2022-05-07 DIAGNOSIS — K219 Gastro-esophageal reflux disease without esophagitis: Secondary | ICD-10-CM | POA: Diagnosis not present

## 2022-05-07 DIAGNOSIS — K5900 Constipation, unspecified: Secondary | ICD-10-CM | POA: Diagnosis not present

## 2022-05-07 DIAGNOSIS — I10 Essential (primary) hypertension: Secondary | ICD-10-CM | POA: Diagnosis not present

## 2022-05-07 DIAGNOSIS — R059 Cough, unspecified: Secondary | ICD-10-CM | POA: Diagnosis not present

## 2022-05-07 DIAGNOSIS — F0390 Unspecified dementia without behavioral disturbance: Secondary | ICD-10-CM | POA: Diagnosis not present

## 2022-05-07 DIAGNOSIS — N189 Chronic kidney disease, unspecified: Secondary | ICD-10-CM | POA: Diagnosis not present

## 2022-05-07 DIAGNOSIS — S42302D Unspecified fracture of shaft of humerus, left arm, subsequent encounter for fracture with routine healing: Secondary | ICD-10-CM | POA: Diagnosis not present

## 2022-05-08 DIAGNOSIS — R32 Unspecified urinary incontinence: Secondary | ICD-10-CM | POA: Diagnosis not present

## 2022-05-08 DIAGNOSIS — I10 Essential (primary) hypertension: Secondary | ICD-10-CM | POA: Diagnosis not present

## 2022-05-08 DIAGNOSIS — F0280 Dementia in other diseases classified elsewhere without behavioral disturbance: Secondary | ICD-10-CM | POA: Diagnosis not present

## 2022-05-08 DIAGNOSIS — E785 Hyperlipidemia, unspecified: Secondary | ICD-10-CM | POA: Diagnosis not present

## 2022-05-08 DIAGNOSIS — G309 Alzheimer's disease, unspecified: Secondary | ICD-10-CM | POA: Diagnosis not present

## 2022-05-08 DIAGNOSIS — R1310 Dysphagia, unspecified: Secondary | ICD-10-CM | POA: Diagnosis not present

## 2022-05-12 DIAGNOSIS — I10 Essential (primary) hypertension: Secondary | ICD-10-CM | POA: Diagnosis not present

## 2022-05-12 DIAGNOSIS — R32 Unspecified urinary incontinence: Secondary | ICD-10-CM | POA: Diagnosis not present

## 2022-05-12 DIAGNOSIS — E785 Hyperlipidemia, unspecified: Secondary | ICD-10-CM | POA: Diagnosis not present

## 2022-05-12 DIAGNOSIS — R1310 Dysphagia, unspecified: Secondary | ICD-10-CM | POA: Diagnosis not present

## 2022-05-12 DIAGNOSIS — G309 Alzheimer's disease, unspecified: Secondary | ICD-10-CM | POA: Diagnosis not present

## 2022-05-12 DIAGNOSIS — F0280 Dementia in other diseases classified elsewhere without behavioral disturbance: Secondary | ICD-10-CM | POA: Diagnosis not present

## 2022-05-15 DIAGNOSIS — G309 Alzheimer's disease, unspecified: Secondary | ICD-10-CM | POA: Diagnosis not present

## 2022-05-15 DIAGNOSIS — R32 Unspecified urinary incontinence: Secondary | ICD-10-CM | POA: Diagnosis not present

## 2022-05-15 DIAGNOSIS — I10 Essential (primary) hypertension: Secondary | ICD-10-CM | POA: Diagnosis not present

## 2022-05-15 DIAGNOSIS — F0280 Dementia in other diseases classified elsewhere without behavioral disturbance: Secondary | ICD-10-CM | POA: Diagnosis not present

## 2022-05-15 DIAGNOSIS — E785 Hyperlipidemia, unspecified: Secondary | ICD-10-CM | POA: Diagnosis not present

## 2022-05-15 DIAGNOSIS — R1310 Dysphagia, unspecified: Secondary | ICD-10-CM | POA: Diagnosis not present

## 2022-05-19 DIAGNOSIS — G309 Alzheimer's disease, unspecified: Secondary | ICD-10-CM | POA: Diagnosis not present

## 2022-05-19 DIAGNOSIS — E785 Hyperlipidemia, unspecified: Secondary | ICD-10-CM | POA: Diagnosis not present

## 2022-05-19 DIAGNOSIS — I10 Essential (primary) hypertension: Secondary | ICD-10-CM | POA: Diagnosis not present

## 2022-05-19 DIAGNOSIS — R1310 Dysphagia, unspecified: Secondary | ICD-10-CM | POA: Diagnosis not present

## 2022-05-19 DIAGNOSIS — F0280 Dementia in other diseases classified elsewhere without behavioral disturbance: Secondary | ICD-10-CM | POA: Diagnosis not present

## 2022-05-19 DIAGNOSIS — R32 Unspecified urinary incontinence: Secondary | ICD-10-CM | POA: Diagnosis not present

## 2022-05-21 DIAGNOSIS — R159 Full incontinence of feces: Secondary | ICD-10-CM | POA: Diagnosis not present

## 2022-05-21 DIAGNOSIS — F0280 Dementia in other diseases classified elsewhere without behavioral disturbance: Secondary | ICD-10-CM | POA: Diagnosis not present

## 2022-05-21 DIAGNOSIS — R32 Unspecified urinary incontinence: Secondary | ICD-10-CM | POA: Diagnosis not present

## 2022-05-21 DIAGNOSIS — G309 Alzheimer's disease, unspecified: Secondary | ICD-10-CM | POA: Diagnosis not present

## 2022-05-21 DIAGNOSIS — R1310 Dysphagia, unspecified: Secondary | ICD-10-CM | POA: Diagnosis not present

## 2022-05-21 DIAGNOSIS — Z6823 Body mass index (BMI) 23.0-23.9, adult: Secondary | ICD-10-CM | POA: Diagnosis not present

## 2022-05-21 DIAGNOSIS — E039 Hypothyroidism, unspecified: Secondary | ICD-10-CM | POA: Diagnosis not present

## 2022-05-21 DIAGNOSIS — I10 Essential (primary) hypertension: Secondary | ICD-10-CM | POA: Diagnosis not present

## 2022-05-21 DIAGNOSIS — K219 Gastro-esophageal reflux disease without esophagitis: Secondary | ICD-10-CM | POA: Diagnosis not present

## 2022-05-21 DIAGNOSIS — E785 Hyperlipidemia, unspecified: Secondary | ICD-10-CM | POA: Diagnosis not present

## 2022-05-22 DIAGNOSIS — I10 Essential (primary) hypertension: Secondary | ICD-10-CM | POA: Diagnosis not present

## 2022-05-22 DIAGNOSIS — E785 Hyperlipidemia, unspecified: Secondary | ICD-10-CM | POA: Diagnosis not present

## 2022-05-22 DIAGNOSIS — F0280 Dementia in other diseases classified elsewhere without behavioral disturbance: Secondary | ICD-10-CM | POA: Diagnosis not present

## 2022-05-22 DIAGNOSIS — R32 Unspecified urinary incontinence: Secondary | ICD-10-CM | POA: Diagnosis not present

## 2022-05-22 DIAGNOSIS — R1310 Dysphagia, unspecified: Secondary | ICD-10-CM | POA: Diagnosis not present

## 2022-05-22 DIAGNOSIS — G309 Alzheimer's disease, unspecified: Secondary | ICD-10-CM | POA: Diagnosis not present

## 2022-05-25 DIAGNOSIS — F3112 Bipolar disorder, current episode manic without psychotic features, moderate: Secondary | ICD-10-CM | POA: Diagnosis not present

## 2022-05-25 DIAGNOSIS — F29 Unspecified psychosis not due to a substance or known physiological condition: Secondary | ICD-10-CM | POA: Diagnosis not present

## 2022-05-25 DIAGNOSIS — G300 Alzheimer's disease with early onset: Secondary | ICD-10-CM | POA: Diagnosis not present

## 2022-05-26 DIAGNOSIS — E785 Hyperlipidemia, unspecified: Secondary | ICD-10-CM | POA: Diagnosis not present

## 2022-05-26 DIAGNOSIS — G309 Alzheimer's disease, unspecified: Secondary | ICD-10-CM | POA: Diagnosis not present

## 2022-05-26 DIAGNOSIS — F0280 Dementia in other diseases classified elsewhere without behavioral disturbance: Secondary | ICD-10-CM | POA: Diagnosis not present

## 2022-05-26 DIAGNOSIS — R1310 Dysphagia, unspecified: Secondary | ICD-10-CM | POA: Diagnosis not present

## 2022-05-26 DIAGNOSIS — I10 Essential (primary) hypertension: Secondary | ICD-10-CM | POA: Diagnosis not present

## 2022-05-26 DIAGNOSIS — R32 Unspecified urinary incontinence: Secondary | ICD-10-CM | POA: Diagnosis not present

## 2022-05-27 DIAGNOSIS — N189 Chronic kidney disease, unspecified: Secondary | ICD-10-CM | POA: Diagnosis not present

## 2022-05-27 DIAGNOSIS — R443 Hallucinations, unspecified: Secondary | ICD-10-CM | POA: Diagnosis not present

## 2022-05-27 DIAGNOSIS — K5900 Constipation, unspecified: Secondary | ICD-10-CM | POA: Diagnosis not present

## 2022-05-27 DIAGNOSIS — J9690 Respiratory failure, unspecified, unspecified whether with hypoxia or hypercapnia: Secondary | ICD-10-CM | POA: Diagnosis not present

## 2022-05-27 DIAGNOSIS — D649 Anemia, unspecified: Secondary | ICD-10-CM | POA: Diagnosis not present

## 2022-05-27 DIAGNOSIS — K449 Diaphragmatic hernia without obstruction or gangrene: Secondary | ICD-10-CM | POA: Diagnosis not present

## 2022-05-27 DIAGNOSIS — F32A Depression, unspecified: Secondary | ICD-10-CM | POA: Diagnosis not present

## 2022-05-27 DIAGNOSIS — E039 Hypothyroidism, unspecified: Secondary | ICD-10-CM | POA: Diagnosis not present

## 2022-05-27 DIAGNOSIS — R1310 Dysphagia, unspecified: Secondary | ICD-10-CM | POA: Diagnosis not present

## 2022-05-27 DIAGNOSIS — K219 Gastro-esophageal reflux disease without esophagitis: Secondary | ICD-10-CM | POA: Diagnosis not present

## 2022-05-29 DIAGNOSIS — E785 Hyperlipidemia, unspecified: Secondary | ICD-10-CM | POA: Diagnosis not present

## 2022-05-29 DIAGNOSIS — I10 Essential (primary) hypertension: Secondary | ICD-10-CM | POA: Diagnosis not present

## 2022-05-29 DIAGNOSIS — G309 Alzheimer's disease, unspecified: Secondary | ICD-10-CM | POA: Diagnosis not present

## 2022-05-29 DIAGNOSIS — R32 Unspecified urinary incontinence: Secondary | ICD-10-CM | POA: Diagnosis not present

## 2022-05-29 DIAGNOSIS — R1310 Dysphagia, unspecified: Secondary | ICD-10-CM | POA: Diagnosis not present

## 2022-05-29 DIAGNOSIS — F0280 Dementia in other diseases classified elsewhere without behavioral disturbance: Secondary | ICD-10-CM | POA: Diagnosis not present

## 2022-06-02 DIAGNOSIS — G309 Alzheimer's disease, unspecified: Secondary | ICD-10-CM | POA: Diagnosis not present

## 2022-06-02 DIAGNOSIS — I10 Essential (primary) hypertension: Secondary | ICD-10-CM | POA: Diagnosis not present

## 2022-06-02 DIAGNOSIS — R1310 Dysphagia, unspecified: Secondary | ICD-10-CM | POA: Diagnosis not present

## 2022-06-02 DIAGNOSIS — E785 Hyperlipidemia, unspecified: Secondary | ICD-10-CM | POA: Diagnosis not present

## 2022-06-02 DIAGNOSIS — R32 Unspecified urinary incontinence: Secondary | ICD-10-CM | POA: Diagnosis not present

## 2022-06-02 DIAGNOSIS — F0280 Dementia in other diseases classified elsewhere without behavioral disturbance: Secondary | ICD-10-CM | POA: Diagnosis not present

## 2022-06-04 DIAGNOSIS — D649 Anemia, unspecified: Secondary | ICD-10-CM | POA: Diagnosis not present

## 2022-06-04 DIAGNOSIS — F419 Anxiety disorder, unspecified: Secondary | ICD-10-CM | POA: Diagnosis not present

## 2022-06-04 DIAGNOSIS — N189 Chronic kidney disease, unspecified: Secondary | ICD-10-CM | POA: Diagnosis not present

## 2022-06-04 DIAGNOSIS — R443 Hallucinations, unspecified: Secondary | ICD-10-CM | POA: Diagnosis not present

## 2022-06-04 DIAGNOSIS — K5900 Constipation, unspecified: Secondary | ICD-10-CM | POA: Diagnosis not present

## 2022-06-04 DIAGNOSIS — R1310 Dysphagia, unspecified: Secondary | ICD-10-CM | POA: Diagnosis not present

## 2022-06-04 DIAGNOSIS — F32A Depression, unspecified: Secondary | ICD-10-CM | POA: Diagnosis not present

## 2022-06-04 DIAGNOSIS — K449 Diaphragmatic hernia without obstruction or gangrene: Secondary | ICD-10-CM | POA: Diagnosis not present

## 2022-06-04 DIAGNOSIS — K219 Gastro-esophageal reflux disease without esophagitis: Secondary | ICD-10-CM | POA: Diagnosis not present

## 2022-06-05 DIAGNOSIS — R32 Unspecified urinary incontinence: Secondary | ICD-10-CM | POA: Diagnosis not present

## 2022-06-05 DIAGNOSIS — R1310 Dysphagia, unspecified: Secondary | ICD-10-CM | POA: Diagnosis not present

## 2022-06-05 DIAGNOSIS — G309 Alzheimer's disease, unspecified: Secondary | ICD-10-CM | POA: Diagnosis not present

## 2022-06-05 DIAGNOSIS — E785 Hyperlipidemia, unspecified: Secondary | ICD-10-CM | POA: Diagnosis not present

## 2022-06-05 DIAGNOSIS — I10 Essential (primary) hypertension: Secondary | ICD-10-CM | POA: Diagnosis not present

## 2022-06-05 DIAGNOSIS — F0280 Dementia in other diseases classified elsewhere without behavioral disturbance: Secondary | ICD-10-CM | POA: Diagnosis not present

## 2022-06-09 DIAGNOSIS — R32 Unspecified urinary incontinence: Secondary | ICD-10-CM | POA: Diagnosis not present

## 2022-06-09 DIAGNOSIS — E785 Hyperlipidemia, unspecified: Secondary | ICD-10-CM | POA: Diagnosis not present

## 2022-06-09 DIAGNOSIS — R1310 Dysphagia, unspecified: Secondary | ICD-10-CM | POA: Diagnosis not present

## 2022-06-09 DIAGNOSIS — I10 Essential (primary) hypertension: Secondary | ICD-10-CM | POA: Diagnosis not present

## 2022-06-09 DIAGNOSIS — F0280 Dementia in other diseases classified elsewhere without behavioral disturbance: Secondary | ICD-10-CM | POA: Diagnosis not present

## 2022-06-09 DIAGNOSIS — G309 Alzheimer's disease, unspecified: Secondary | ICD-10-CM | POA: Diagnosis not present

## 2022-06-10 DIAGNOSIS — E039 Hypothyroidism, unspecified: Secondary | ICD-10-CM | POA: Diagnosis not present

## 2022-06-10 DIAGNOSIS — E559 Vitamin D deficiency, unspecified: Secondary | ICD-10-CM | POA: Diagnosis not present

## 2022-06-10 DIAGNOSIS — E782 Mixed hyperlipidemia: Secondary | ICD-10-CM | POA: Diagnosis not present

## 2022-06-10 DIAGNOSIS — I10 Essential (primary) hypertension: Secondary | ICD-10-CM | POA: Diagnosis not present

## 2022-06-10 DIAGNOSIS — F0390 Unspecified dementia without behavioral disturbance: Secondary | ICD-10-CM | POA: Diagnosis not present

## 2022-06-10 DIAGNOSIS — N189 Chronic kidney disease, unspecified: Secondary | ICD-10-CM | POA: Diagnosis not present

## 2022-06-11 DIAGNOSIS — E785 Hyperlipidemia, unspecified: Secondary | ICD-10-CM | POA: Diagnosis not present

## 2022-06-11 DIAGNOSIS — I10 Essential (primary) hypertension: Secondary | ICD-10-CM | POA: Diagnosis not present

## 2022-06-11 DIAGNOSIS — R32 Unspecified urinary incontinence: Secondary | ICD-10-CM | POA: Diagnosis not present

## 2022-06-11 DIAGNOSIS — F0280 Dementia in other diseases classified elsewhere without behavioral disturbance: Secondary | ICD-10-CM | POA: Diagnosis not present

## 2022-06-11 DIAGNOSIS — R1310 Dysphagia, unspecified: Secondary | ICD-10-CM | POA: Diagnosis not present

## 2022-06-11 DIAGNOSIS — G309 Alzheimer's disease, unspecified: Secondary | ICD-10-CM | POA: Diagnosis not present

## 2022-06-16 DIAGNOSIS — E785 Hyperlipidemia, unspecified: Secondary | ICD-10-CM | POA: Diagnosis not present

## 2022-06-16 DIAGNOSIS — I10 Essential (primary) hypertension: Secondary | ICD-10-CM | POA: Diagnosis not present

## 2022-06-16 DIAGNOSIS — G309 Alzheimer's disease, unspecified: Secondary | ICD-10-CM | POA: Diagnosis not present

## 2022-06-16 DIAGNOSIS — R1310 Dysphagia, unspecified: Secondary | ICD-10-CM | POA: Diagnosis not present

## 2022-06-16 DIAGNOSIS — F0280 Dementia in other diseases classified elsewhere without behavioral disturbance: Secondary | ICD-10-CM | POA: Diagnosis not present

## 2022-06-16 DIAGNOSIS — R32 Unspecified urinary incontinence: Secondary | ICD-10-CM | POA: Diagnosis not present

## 2022-06-17 DIAGNOSIS — F0280 Dementia in other diseases classified elsewhere without behavioral disturbance: Secondary | ICD-10-CM | POA: Diagnosis not present

## 2022-06-17 DIAGNOSIS — E785 Hyperlipidemia, unspecified: Secondary | ICD-10-CM | POA: Diagnosis not present

## 2022-06-17 DIAGNOSIS — R1310 Dysphagia, unspecified: Secondary | ICD-10-CM | POA: Diagnosis not present

## 2022-06-17 DIAGNOSIS — G309 Alzheimer's disease, unspecified: Secondary | ICD-10-CM | POA: Diagnosis not present

## 2022-06-17 DIAGNOSIS — I10 Essential (primary) hypertension: Secondary | ICD-10-CM | POA: Diagnosis not present

## 2022-06-17 DIAGNOSIS — R32 Unspecified urinary incontinence: Secondary | ICD-10-CM | POA: Diagnosis not present

## 2022-06-18 DIAGNOSIS — I10 Essential (primary) hypertension: Secondary | ICD-10-CM | POA: Diagnosis not present

## 2022-06-19 DIAGNOSIS — R32 Unspecified urinary incontinence: Secondary | ICD-10-CM | POA: Diagnosis not present

## 2022-06-19 DIAGNOSIS — G309 Alzheimer's disease, unspecified: Secondary | ICD-10-CM | POA: Diagnosis not present

## 2022-06-19 DIAGNOSIS — I10 Essential (primary) hypertension: Secondary | ICD-10-CM | POA: Diagnosis not present

## 2022-06-19 DIAGNOSIS — R1310 Dysphagia, unspecified: Secondary | ICD-10-CM | POA: Diagnosis not present

## 2022-06-19 DIAGNOSIS — E785 Hyperlipidemia, unspecified: Secondary | ICD-10-CM | POA: Diagnosis not present

## 2022-06-19 DIAGNOSIS — F0280 Dementia in other diseases classified elsewhere without behavioral disturbance: Secondary | ICD-10-CM | POA: Diagnosis not present

## 2022-06-20 DIAGNOSIS — R159 Full incontinence of feces: Secondary | ICD-10-CM | POA: Diagnosis not present

## 2022-06-20 DIAGNOSIS — K219 Gastro-esophageal reflux disease without esophagitis: Secondary | ICD-10-CM | POA: Diagnosis not present

## 2022-06-20 DIAGNOSIS — Z6823 Body mass index (BMI) 23.0-23.9, adult: Secondary | ICD-10-CM | POA: Diagnosis not present

## 2022-06-20 DIAGNOSIS — R32 Unspecified urinary incontinence: Secondary | ICD-10-CM | POA: Diagnosis not present

## 2022-06-20 DIAGNOSIS — E785 Hyperlipidemia, unspecified: Secondary | ICD-10-CM | POA: Diagnosis not present

## 2022-06-20 DIAGNOSIS — G309 Alzheimer's disease, unspecified: Secondary | ICD-10-CM | POA: Diagnosis not present

## 2022-06-20 DIAGNOSIS — J9611 Chronic respiratory failure with hypoxia: Secondary | ICD-10-CM | POA: Diagnosis not present

## 2022-06-20 DIAGNOSIS — E039 Hypothyroidism, unspecified: Secondary | ICD-10-CM | POA: Diagnosis not present

## 2022-06-20 DIAGNOSIS — F0280 Dementia in other diseases classified elsewhere without behavioral disturbance: Secondary | ICD-10-CM | POA: Diagnosis not present

## 2022-06-20 DIAGNOSIS — R1310 Dysphagia, unspecified: Secondary | ICD-10-CM | POA: Diagnosis not present

## 2022-06-20 DIAGNOSIS — I10 Essential (primary) hypertension: Secondary | ICD-10-CM | POA: Diagnosis not present

## 2022-06-22 DIAGNOSIS — F29 Unspecified psychosis not due to a substance or known physiological condition: Secondary | ICD-10-CM | POA: Diagnosis not present

## 2022-06-22 DIAGNOSIS — F03A2 Unspecified dementia, mild, with psychotic disturbance: Secondary | ICD-10-CM | POA: Diagnosis not present

## 2022-06-22 DIAGNOSIS — F3112 Bipolar disorder, current episode manic without psychotic features, moderate: Secondary | ICD-10-CM | POA: Diagnosis not present

## 2022-06-22 DIAGNOSIS — G300 Alzheimer's disease with early onset: Secondary | ICD-10-CM | POA: Diagnosis not present

## 2022-06-23 DIAGNOSIS — R32 Unspecified urinary incontinence: Secondary | ICD-10-CM | POA: Diagnosis not present

## 2022-06-23 DIAGNOSIS — E785 Hyperlipidemia, unspecified: Secondary | ICD-10-CM | POA: Diagnosis not present

## 2022-06-23 DIAGNOSIS — I10 Essential (primary) hypertension: Secondary | ICD-10-CM | POA: Diagnosis not present

## 2022-06-23 DIAGNOSIS — R1310 Dysphagia, unspecified: Secondary | ICD-10-CM | POA: Diagnosis not present

## 2022-06-23 DIAGNOSIS — F0280 Dementia in other diseases classified elsewhere without behavioral disturbance: Secondary | ICD-10-CM | POA: Diagnosis not present

## 2022-06-23 DIAGNOSIS — G309 Alzheimer's disease, unspecified: Secondary | ICD-10-CM | POA: Diagnosis not present

## 2022-06-24 DIAGNOSIS — E782 Mixed hyperlipidemia: Secondary | ICD-10-CM | POA: Diagnosis not present

## 2022-06-24 DIAGNOSIS — F419 Anxiety disorder, unspecified: Secondary | ICD-10-CM | POA: Diagnosis not present

## 2022-06-24 DIAGNOSIS — E559 Vitamin D deficiency, unspecified: Secondary | ICD-10-CM | POA: Diagnosis not present

## 2022-06-24 DIAGNOSIS — F0390 Unspecified dementia without behavioral disturbance: Secondary | ICD-10-CM | POA: Diagnosis not present

## 2022-06-24 DIAGNOSIS — E039 Hypothyroidism, unspecified: Secondary | ICD-10-CM | POA: Diagnosis not present

## 2022-06-24 DIAGNOSIS — K5900 Constipation, unspecified: Secondary | ICD-10-CM | POA: Diagnosis not present

## 2022-06-24 DIAGNOSIS — R1310 Dysphagia, unspecified: Secondary | ICD-10-CM | POA: Diagnosis not present

## 2022-06-24 DIAGNOSIS — K219 Gastro-esophageal reflux disease without esophagitis: Secondary | ICD-10-CM | POA: Diagnosis not present

## 2022-06-24 DIAGNOSIS — I10 Essential (primary) hypertension: Secondary | ICD-10-CM | POA: Diagnosis not present

## 2022-06-24 DIAGNOSIS — N189 Chronic kidney disease, unspecified: Secondary | ICD-10-CM | POA: Diagnosis not present

## 2022-06-24 DIAGNOSIS — R443 Hallucinations, unspecified: Secondary | ICD-10-CM | POA: Diagnosis not present

## 2022-06-24 DIAGNOSIS — D649 Anemia, unspecified: Secondary | ICD-10-CM | POA: Diagnosis not present

## 2022-06-25 DIAGNOSIS — F0280 Dementia in other diseases classified elsewhere without behavioral disturbance: Secondary | ICD-10-CM | POA: Diagnosis not present

## 2022-06-25 DIAGNOSIS — E785 Hyperlipidemia, unspecified: Secondary | ICD-10-CM | POA: Diagnosis not present

## 2022-06-25 DIAGNOSIS — R1310 Dysphagia, unspecified: Secondary | ICD-10-CM | POA: Diagnosis not present

## 2022-06-25 DIAGNOSIS — G309 Alzheimer's disease, unspecified: Secondary | ICD-10-CM | POA: Diagnosis not present

## 2022-06-25 DIAGNOSIS — R32 Unspecified urinary incontinence: Secondary | ICD-10-CM | POA: Diagnosis not present

## 2022-06-25 DIAGNOSIS — I10 Essential (primary) hypertension: Secondary | ICD-10-CM | POA: Diagnosis not present

## 2022-06-26 DIAGNOSIS — R1310 Dysphagia, unspecified: Secondary | ICD-10-CM | POA: Diagnosis not present

## 2022-06-26 DIAGNOSIS — F0280 Dementia in other diseases classified elsewhere without behavioral disturbance: Secondary | ICD-10-CM | POA: Diagnosis not present

## 2022-06-26 DIAGNOSIS — I10 Essential (primary) hypertension: Secondary | ICD-10-CM | POA: Diagnosis not present

## 2022-06-26 DIAGNOSIS — G309 Alzheimer's disease, unspecified: Secondary | ICD-10-CM | POA: Diagnosis not present

## 2022-06-26 DIAGNOSIS — E785 Hyperlipidemia, unspecified: Secondary | ICD-10-CM | POA: Diagnosis not present

## 2022-06-26 DIAGNOSIS — R32 Unspecified urinary incontinence: Secondary | ICD-10-CM | POA: Diagnosis not present

## 2022-06-30 DIAGNOSIS — F0280 Dementia in other diseases classified elsewhere without behavioral disturbance: Secondary | ICD-10-CM | POA: Diagnosis not present

## 2022-06-30 DIAGNOSIS — I10 Essential (primary) hypertension: Secondary | ICD-10-CM | POA: Diagnosis not present

## 2022-06-30 DIAGNOSIS — E785 Hyperlipidemia, unspecified: Secondary | ICD-10-CM | POA: Diagnosis not present

## 2022-06-30 DIAGNOSIS — R32 Unspecified urinary incontinence: Secondary | ICD-10-CM | POA: Diagnosis not present

## 2022-06-30 DIAGNOSIS — R1310 Dysphagia, unspecified: Secondary | ICD-10-CM | POA: Diagnosis not present

## 2022-06-30 DIAGNOSIS — G309 Alzheimer's disease, unspecified: Secondary | ICD-10-CM | POA: Diagnosis not present

## 2022-07-02 DIAGNOSIS — R1310 Dysphagia, unspecified: Secondary | ICD-10-CM | POA: Diagnosis not present

## 2022-07-02 DIAGNOSIS — R443 Hallucinations, unspecified: Secondary | ICD-10-CM | POA: Diagnosis not present

## 2022-07-02 DIAGNOSIS — N189 Chronic kidney disease, unspecified: Secondary | ICD-10-CM | POA: Diagnosis not present

## 2022-07-02 DIAGNOSIS — I10 Essential (primary) hypertension: Secondary | ICD-10-CM | POA: Diagnosis not present

## 2022-07-02 DIAGNOSIS — D649 Anemia, unspecified: Secondary | ICD-10-CM | POA: Diagnosis not present

## 2022-07-03 DIAGNOSIS — R32 Unspecified urinary incontinence: Secondary | ICD-10-CM | POA: Diagnosis not present

## 2022-07-03 DIAGNOSIS — G309 Alzheimer's disease, unspecified: Secondary | ICD-10-CM | POA: Diagnosis not present

## 2022-07-03 DIAGNOSIS — E785 Hyperlipidemia, unspecified: Secondary | ICD-10-CM | POA: Diagnosis not present

## 2022-07-03 DIAGNOSIS — I10 Essential (primary) hypertension: Secondary | ICD-10-CM | POA: Diagnosis not present

## 2022-07-03 DIAGNOSIS — F0280 Dementia in other diseases classified elsewhere without behavioral disturbance: Secondary | ICD-10-CM | POA: Diagnosis not present

## 2022-07-03 DIAGNOSIS — R1310 Dysphagia, unspecified: Secondary | ICD-10-CM | POA: Diagnosis not present

## 2022-07-07 DIAGNOSIS — R1310 Dysphagia, unspecified: Secondary | ICD-10-CM | POA: Diagnosis not present

## 2022-07-07 DIAGNOSIS — G309 Alzheimer's disease, unspecified: Secondary | ICD-10-CM | POA: Diagnosis not present

## 2022-07-07 DIAGNOSIS — R32 Unspecified urinary incontinence: Secondary | ICD-10-CM | POA: Diagnosis not present

## 2022-07-07 DIAGNOSIS — F0280 Dementia in other diseases classified elsewhere without behavioral disturbance: Secondary | ICD-10-CM | POA: Diagnosis not present

## 2022-07-07 DIAGNOSIS — E785 Hyperlipidemia, unspecified: Secondary | ICD-10-CM | POA: Diagnosis not present

## 2022-07-07 DIAGNOSIS — I10 Essential (primary) hypertension: Secondary | ICD-10-CM | POA: Diagnosis not present

## 2022-07-09 DIAGNOSIS — I10 Essential (primary) hypertension: Secondary | ICD-10-CM | POA: Diagnosis not present

## 2022-07-09 DIAGNOSIS — F0280 Dementia in other diseases classified elsewhere without behavioral disturbance: Secondary | ICD-10-CM | POA: Diagnosis not present

## 2022-07-09 DIAGNOSIS — R1310 Dysphagia, unspecified: Secondary | ICD-10-CM | POA: Diagnosis not present

## 2022-07-09 DIAGNOSIS — G309 Alzheimer's disease, unspecified: Secondary | ICD-10-CM | POA: Diagnosis not present

## 2022-07-09 DIAGNOSIS — R32 Unspecified urinary incontinence: Secondary | ICD-10-CM | POA: Diagnosis not present

## 2022-07-09 DIAGNOSIS — E785 Hyperlipidemia, unspecified: Secondary | ICD-10-CM | POA: Diagnosis not present

## 2022-07-10 DIAGNOSIS — R32 Unspecified urinary incontinence: Secondary | ICD-10-CM | POA: Diagnosis not present

## 2022-07-10 DIAGNOSIS — F0280 Dementia in other diseases classified elsewhere without behavioral disturbance: Secondary | ICD-10-CM | POA: Diagnosis not present

## 2022-07-10 DIAGNOSIS — G309 Alzheimer's disease, unspecified: Secondary | ICD-10-CM | POA: Diagnosis not present

## 2022-07-10 DIAGNOSIS — I10 Essential (primary) hypertension: Secondary | ICD-10-CM | POA: Diagnosis not present

## 2022-07-10 DIAGNOSIS — E785 Hyperlipidemia, unspecified: Secondary | ICD-10-CM | POA: Diagnosis not present

## 2022-07-10 DIAGNOSIS — R1310 Dysphagia, unspecified: Secondary | ICD-10-CM | POA: Diagnosis not present

## 2022-07-14 DIAGNOSIS — R1310 Dysphagia, unspecified: Secondary | ICD-10-CM | POA: Diagnosis not present

## 2022-07-14 DIAGNOSIS — G309 Alzheimer's disease, unspecified: Secondary | ICD-10-CM | POA: Diagnosis not present

## 2022-07-14 DIAGNOSIS — R32 Unspecified urinary incontinence: Secondary | ICD-10-CM | POA: Diagnosis not present

## 2022-07-14 DIAGNOSIS — E785 Hyperlipidemia, unspecified: Secondary | ICD-10-CM | POA: Diagnosis not present

## 2022-07-14 DIAGNOSIS — I10 Essential (primary) hypertension: Secondary | ICD-10-CM | POA: Diagnosis not present

## 2022-07-14 DIAGNOSIS — F0280 Dementia in other diseases classified elsewhere without behavioral disturbance: Secondary | ICD-10-CM | POA: Diagnosis not present

## 2022-07-15 DIAGNOSIS — F32A Depression, unspecified: Secondary | ICD-10-CM | POA: Diagnosis not present

## 2022-07-15 DIAGNOSIS — D649 Anemia, unspecified: Secondary | ICD-10-CM | POA: Diagnosis not present

## 2022-07-15 DIAGNOSIS — K5900 Constipation, unspecified: Secondary | ICD-10-CM | POA: Diagnosis not present

## 2022-07-15 DIAGNOSIS — E039 Hypothyroidism, unspecified: Secondary | ICD-10-CM | POA: Diagnosis not present

## 2022-07-15 DIAGNOSIS — R1310 Dysphagia, unspecified: Secondary | ICD-10-CM | POA: Diagnosis not present

## 2022-07-15 DIAGNOSIS — K219 Gastro-esophageal reflux disease without esophagitis: Secondary | ICD-10-CM | POA: Diagnosis not present

## 2022-07-15 DIAGNOSIS — F0390 Unspecified dementia without behavioral disturbance: Secondary | ICD-10-CM | POA: Diagnosis not present

## 2022-07-15 DIAGNOSIS — N189 Chronic kidney disease, unspecified: Secondary | ICD-10-CM | POA: Diagnosis not present

## 2022-07-15 DIAGNOSIS — K449 Diaphragmatic hernia without obstruction or gangrene: Secondary | ICD-10-CM | POA: Diagnosis not present

## 2022-07-15 DIAGNOSIS — R443 Hallucinations, unspecified: Secondary | ICD-10-CM | POA: Diagnosis not present

## 2022-07-16 DIAGNOSIS — G309 Alzheimer's disease, unspecified: Secondary | ICD-10-CM | POA: Diagnosis not present

## 2022-07-16 DIAGNOSIS — F0280 Dementia in other diseases classified elsewhere without behavioral disturbance: Secondary | ICD-10-CM | POA: Diagnosis not present

## 2022-07-16 DIAGNOSIS — E785 Hyperlipidemia, unspecified: Secondary | ICD-10-CM | POA: Diagnosis not present

## 2022-07-16 DIAGNOSIS — R1310 Dysphagia, unspecified: Secondary | ICD-10-CM | POA: Diagnosis not present

## 2022-07-16 DIAGNOSIS — I10 Essential (primary) hypertension: Secondary | ICD-10-CM | POA: Diagnosis not present

## 2022-07-16 DIAGNOSIS — R32 Unspecified urinary incontinence: Secondary | ICD-10-CM | POA: Diagnosis not present

## 2022-07-16 DIAGNOSIS — R6889 Other general symptoms and signs: Secondary | ICD-10-CM | POA: Diagnosis not present

## 2022-07-17 DIAGNOSIS — I10 Essential (primary) hypertension: Secondary | ICD-10-CM | POA: Diagnosis not present

## 2022-07-17 DIAGNOSIS — F0280 Dementia in other diseases classified elsewhere without behavioral disturbance: Secondary | ICD-10-CM | POA: Diagnosis not present

## 2022-07-17 DIAGNOSIS — R1310 Dysphagia, unspecified: Secondary | ICD-10-CM | POA: Diagnosis not present

## 2022-07-17 DIAGNOSIS — E785 Hyperlipidemia, unspecified: Secondary | ICD-10-CM | POA: Diagnosis not present

## 2022-07-17 DIAGNOSIS — G309 Alzheimer's disease, unspecified: Secondary | ICD-10-CM | POA: Diagnosis not present

## 2022-07-17 DIAGNOSIS — R32 Unspecified urinary incontinence: Secondary | ICD-10-CM | POA: Diagnosis not present

## 2022-07-21 DIAGNOSIS — 419620001 Death: Secondary | SNOMED CT | POA: Diagnosis not present

## 2022-07-21 DEATH — deceased
